# Patient Record
Sex: Female | Born: 1935 | Race: White | Hispanic: No | Smoking: Never smoker
Health system: Southern US, Community
[De-identification: ages and names within clinical notes are randomized; demographics above are authoritative.]

## PROBLEM LIST (undated history)

## (undated) DIAGNOSIS — C4490 Unspecified malignant neoplasm of skin, unspecified: Secondary | ICD-10-CM

## (undated) DIAGNOSIS — I10 Essential (primary) hypertension: Secondary | ICD-10-CM

## (undated) DIAGNOSIS — J189 Pneumonia, unspecified organism: Secondary | ICD-10-CM

## (undated) DIAGNOSIS — E7800 Pure hypercholesterolemia, unspecified: Secondary | ICD-10-CM

## (undated) DIAGNOSIS — T39395A Adverse effect of other nonsteroidal anti-inflammatory drugs [NSAID], initial encounter: Secondary | ICD-10-CM

## (undated) DIAGNOSIS — E079 Disorder of thyroid, unspecified: Secondary | ICD-10-CM

## (undated) DIAGNOSIS — I82409 Acute embolism and thrombosis of unspecified deep veins of unspecified lower extremity: Secondary | ICD-10-CM

## (undated) DIAGNOSIS — E0590 Thyrotoxicosis, unspecified without thyrotoxic crisis or storm: Secondary | ICD-10-CM

## (undated) DIAGNOSIS — K922 Gastrointestinal hemorrhage, unspecified: Secondary | ICD-10-CM

## (undated) HISTORY — PX: KNEE SURGERY: SHX244

## (undated) HISTORY — PX: TONSILLECTOMY: SUR1361

## (undated) HISTORY — PX: TUBAL LIGATION: SHX77

---

## 2018-07-19 ENCOUNTER — Emergency Department (HOSPITAL_BASED_OUTPATIENT_CLINIC_OR_DEPARTMENT_OTHER)
Admission: EM | Admit: 2018-07-19 | Discharge: 2018-07-19 | Disposition: A | Discharge status: Home / Self Care | Payer: Medicare Other | Attending: Emergency Medicine | Admitting: Emergency Medicine

## 2018-07-19 ENCOUNTER — Other Ambulatory Visit: Payer: Self-pay

## 2018-07-19 ENCOUNTER — Emergency Department (HOSPITAL_BASED_OUTPATIENT_CLINIC_OR_DEPARTMENT_OTHER): Payer: Medicare Other

## 2018-07-19 ENCOUNTER — Encounter (HOSPITAL_BASED_OUTPATIENT_CLINIC_OR_DEPARTMENT_OTHER): Payer: Self-pay

## 2018-07-19 DIAGNOSIS — S8011XA Contusion of right lower leg, initial encounter: Secondary | ICD-10-CM

## 2018-07-19 DIAGNOSIS — Y92003 Bedroom of unspecified non-institutional (private) residence as the place of occurrence of the external cause: Secondary | ICD-10-CM | POA: Insufficient documentation

## 2018-07-19 DIAGNOSIS — S8991XA Unspecified injury of right lower leg, initial encounter: Secondary | ICD-10-CM | POA: Diagnosis present

## 2018-07-19 DIAGNOSIS — I10 Essential (primary) hypertension: Secondary | ICD-10-CM | POA: Insufficient documentation

## 2018-07-19 DIAGNOSIS — W2209XA Striking against other stationary object, initial encounter: Secondary | ICD-10-CM | POA: Insufficient documentation

## 2018-07-19 DIAGNOSIS — Y998 Other external cause status: Secondary | ICD-10-CM | POA: Diagnosis not present

## 2018-07-19 DIAGNOSIS — Y9389 Activity, other specified: Secondary | ICD-10-CM | POA: Diagnosis not present

## 2018-07-19 HISTORY — DX: Disorder of thyroid, unspecified: E07.9

## 2018-07-19 HISTORY — DX: Adverse effect of other nonsteroidal anti-inflammatory drugs (NSAID), initial encounter: T39.395A

## 2018-07-19 HISTORY — DX: Essential (primary) hypertension: I10

## 2018-07-19 HISTORY — DX: Unspecified malignant neoplasm of skin, unspecified: C44.90

## 2018-07-19 HISTORY — DX: Gastrointestinal hemorrhage, unspecified: K92.2

## 2018-07-19 HISTORY — DX: Pure hypercholesterolemia, unspecified: E78.00

## 2018-07-19 HISTORY — DX: Acute embolism and thrombosis of unspecified deep veins of unspecified lower extremity: I82.409

## 2018-07-19 MED ORDER — ACETAMINOPHEN 325 MG PO TABS
650.0000 mg | ORAL_TABLET | Freq: Once | ORAL | Status: AC
  Administered 2018-07-19: 650 mg via ORAL

## 2018-07-19 MED ORDER — ACETAMINOPHEN 325 MG PO TABS
ORAL_TABLET | ORAL | Status: AC
  Filled 2018-07-19: qty 2

## 2018-07-19 NOTE — ED Triage Notes (Signed)
Pt states she injured right LE on side of her bed last night-bruising/swelling noted to tib/fib area-NAD-to triage in w/c

## 2018-07-19 NOTE — ED Provider Notes (Signed)
[facility] DEPART[facility] Provider Note   CSN: [id number] Arrival date & time: 07/19/18  1102     History   Chief Complaint Chief Complaint  Patient presents with  . Leg Injury    HPI Norma Coleman is a 84 y.o. female.  HPI  84 year old female presents with right leg pain and bruising.  Last night around 3 AM she was trying to get into her bed and when she swung her leg up it slipped and hit the bed.  She did not think was that bad but woke up with significant swelling and pain this morning.  No other injuries.  Has not taken anything for the pain.  Past Medical History:  Diagnosis Date  . DVT (deep venous thrombosis) ([place])   . GI bleed due to NSAIDs   . High cholesterol   . Hypertension   . Skin cancer   . Thyroid disease     There are no active problems to display for this patient.   Past Surgical History:  Procedure Laterality Date  . KNEE SURGERY    . TONSILLECTOMY    . TUBAL LIGATION       OB History   None      Home Medications    Prior to Admission medications   Not on File    Family History No family history on file.  Social History Social History   Tobacco Use  . Smoking status: Never Smoker  . Smokeless tobacco: Never Used  Substance Use Topics  . Alcohol use: Never    Frequency: Never  . Drug use: Never     Allergies   Ibuprofen and Sulfa antibiotics   Review of Systems Review of Systems  Cardiovascular: Positive for leg swelling.  Musculoskeletal: Positive for arthralgias.  Skin: Negative for wound.  Neurological: Negative for weakness and numbness.     Physical Exam Updated Vital Signs BP 138/64 (BP Location: Right Arm)   Pulse 88   Temp 98.1 F (36.7 C) (Oral)   Resp 16   Ht 5\' 3"  (1.6 m)   Wt 76.2 kg   SpO2 98%   BMI 29.76 kg/m   Physical Exam  Constitutional: She appears well-developed and well-nourished. No distress.  HENT:  Head: Normocephalic and atraumatic.  Right Ear: External  ear normal.  Left Ear: External ear normal.  Nose: Nose normal.  Eyes: Right eye exhibits no discharge. Left eye exhibits no discharge.  Cardiovascular: Normal rate and regular rhythm.  Pulses:      Dorsalis pedis pulses are 2+ on the right side.  Pulmonary/Chest: Effort normal.  Musculoskeletal:       Right knee: She exhibits normal range of motion. No tenderness found.       Right ankle: She exhibits normal range of motion. No tenderness.       Right lower leg: She exhibits tenderness and swelling.       Legs: Neurological: She is alert.  Skin: Skin is warm and dry. She is not diaphoretic.  Psychiatric: Her mood appears not anxious.  Nursing note and vitals reviewed.    ED Treatments / Results  Labs (all labs ordered are listed, but only abnormal results are displayed) Labs Reviewed - No data to display  EKG None  Radiology Dg Tibia/fibula Right  Result Date: 07/19/2018 CLINICAL DATA:  Right tib/fib injury this am, slipped while getting into bed, large bruise and hematoma to medial mid-shaft of tib/fibAble to bear weight EXAM: RIGHT TIBIA AND FIBULA -  2 VIEW COMPARISON:  None. FINDINGS: No fracture or bone lesion. Knee prosthetic components appear well seated and aligned. Ankle joint is normally spaced and aligned. There is anterior mid leg soft tissue swelling and more diffuse subcutaneous soft tissue edema. IMPRESSION: 1. No fracture or dislocation. No evidence of loosening of the knee prosthetic components. Electronically Signed   By: Lajean Manes M.D.   On: 07/19/2018 12:07    Procedures Procedures (including critical care time)  Medications Ordered in ED Medications - No data to display   Initial Impression / Assessment and Plan / ED Course  I have reviewed the triage vital signs and the nursing notes.  Pertinent labs & imaging results that were available during my care of the patient were reviewed by me and considered in my medical decision making (see chart for  details).     Patient does seem to have an exaggerated ecchymosis which is probably from the Eliquis she is on.  However there is no obvious fracture.  She is been able to walk on it without difficulty.  We discussed ice therapy and elevation as well as Tylenol.  Otherwise appears stable for discharge home.  Not consistent with compartment syndrome and is neurovascular intact.  Final Clinical Impressions(s) / ED Diagnoses   Final diagnoses:  Contusion of right leg, initial encounter    ED Discharge Orders    None       Sherwood Gambler, MD 07/19/18 1306

## 2018-07-19 NOTE — Discharge Instructions (Signed)
If you develop severe uncontrolled leg pain, worsening leg swelling, numbness or weakness, color change, or any other new/concerning symptoms and return to the ER for evaluation.  Otherwise follow-up with your primary care physician.

## 2018-07-19 NOTE — ED Notes (Signed)
Patient transported to X-ray 

## 2020-07-06 IMAGING — DX DG TIBIA/FIBULA 2V*R*
4 series · 4 of 4 positions shown · non-contrast
Comparison: None.

CLINICAL DATA: Right tib/fib injury this am, slipped while getting
into bed, large bruise and hematoma to medial mid-shaft of
tib/fibAble to bear weight

EXAM:
RIGHT TIBIA AND FIBULA - 2 VIEW

[tibia ap (1 of 2)]
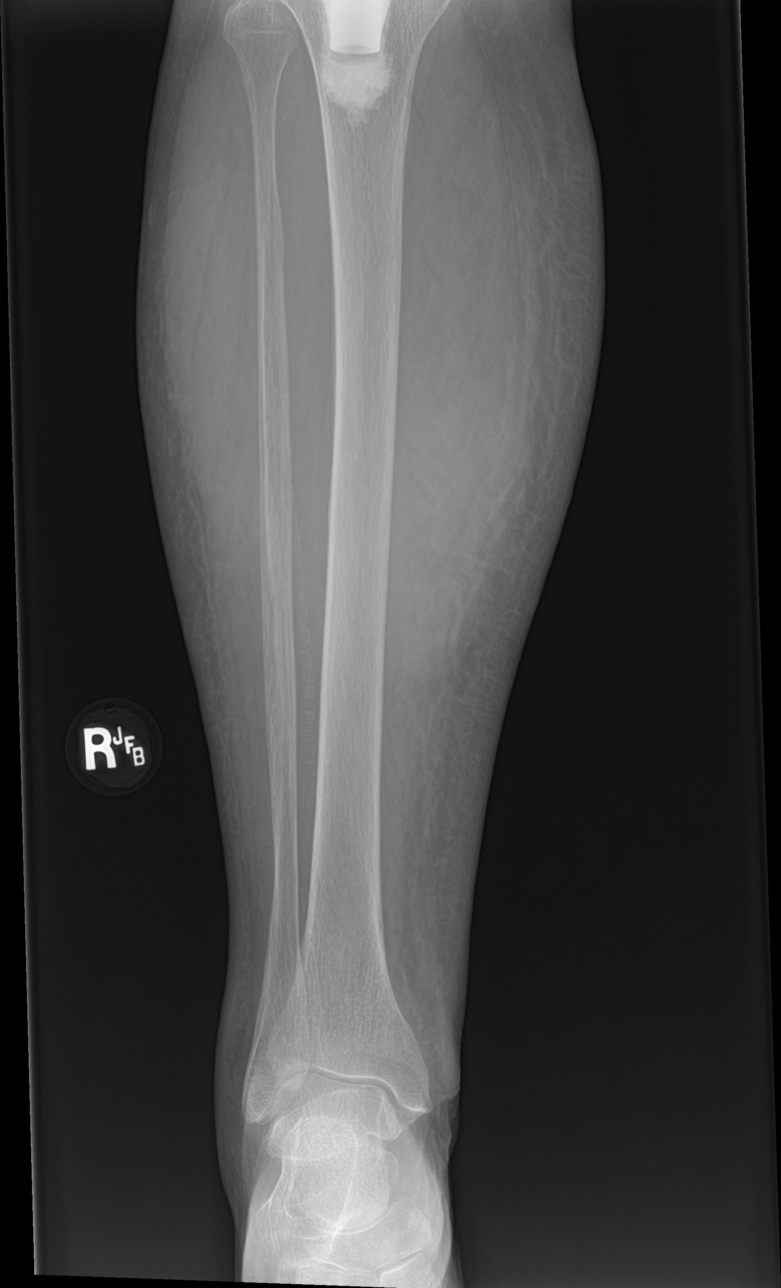

[tibia ap (2 of 2)]
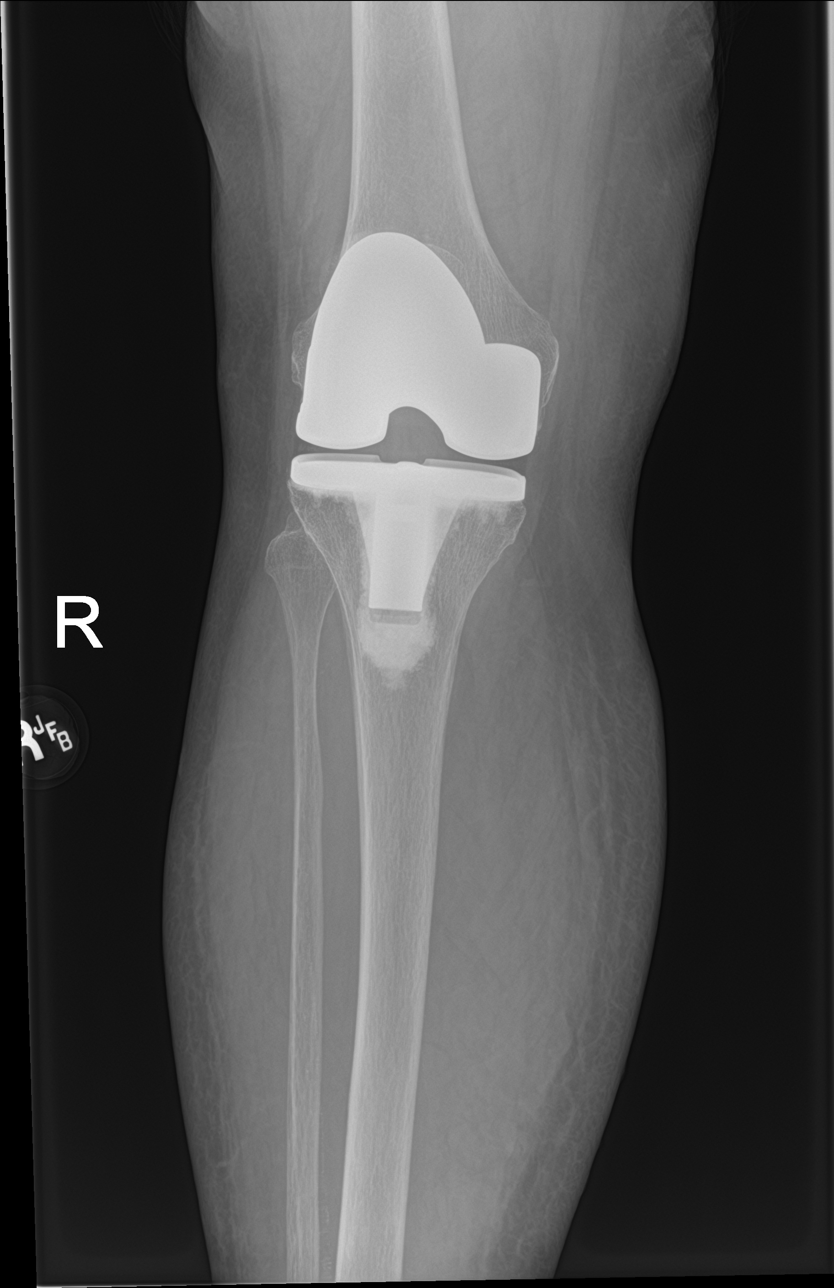

[tibia lat (1 of 2)]
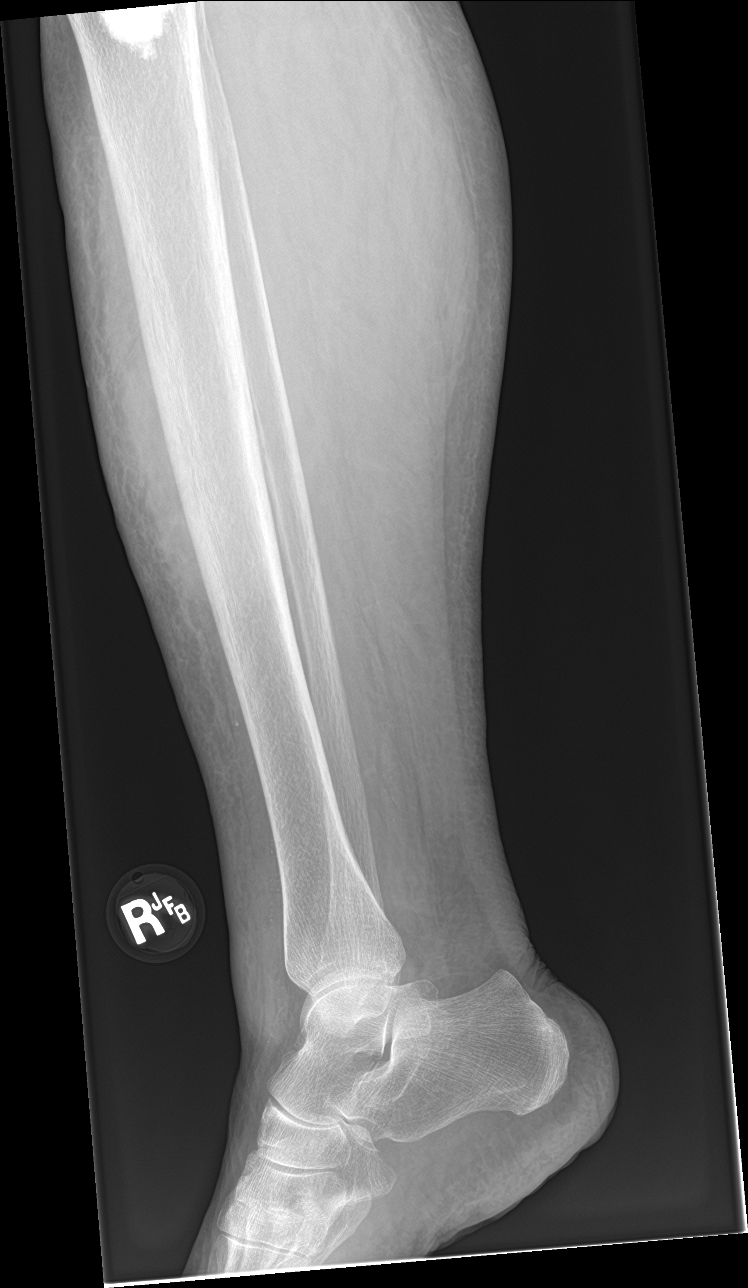

[tibia lat (2 of 2)]
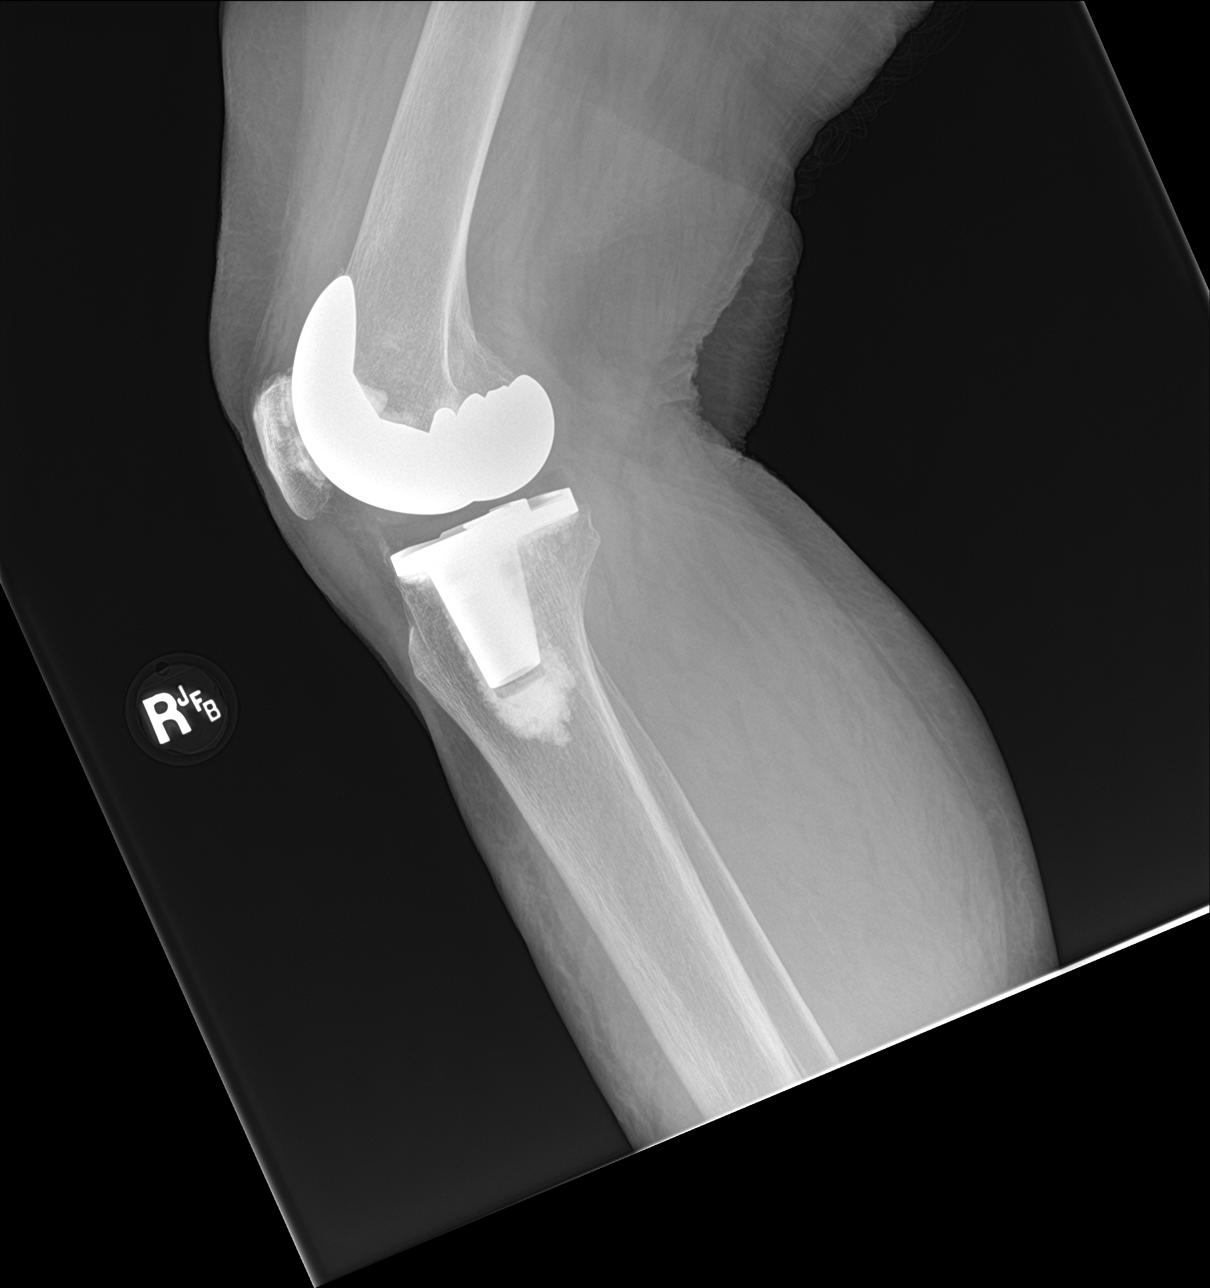

[4 of 4 positions shown; findings below may reference images not displayed]

FINDINGS: No fracture or bone lesion.

Knee prosthetic components appear well seated and aligned. Ankle
joint is normally spaced and aligned.

There is anterior mid leg soft tissue swelling and more diffuse
subcutaneous soft tissue edema.
IMPRESSION: 1. No fracture or dislocation. No evidence of loosening of the knee
prosthetic components.

## 2022-08-05 ENCOUNTER — Inpatient Hospital Stay (HOSPITAL_COMMUNITY): Payer: Medicare PPO

## 2022-08-05 ENCOUNTER — Encounter (HOSPITAL_COMMUNITY): Payer: Self-pay

## 2022-08-05 ENCOUNTER — Observation Stay (HOSPITAL_COMMUNITY)
Admission: EM | Admit: 2022-08-05 | Discharge: 2022-08-09 | Disposition: A | Discharge status: Home / Self Care | Payer: Medicare PPO | Attending: Internal Medicine | Admitting: Internal Medicine

## 2022-08-05 ENCOUNTER — Other Ambulatory Visit: Payer: Self-pay

## 2022-08-05 ENCOUNTER — Emergency Department (HOSPITAL_COMMUNITY): Payer: Medicare PPO

## 2022-08-05 DIAGNOSIS — R2689 Other abnormalities of gait and mobility: Secondary | ICD-10-CM | POA: Insufficient documentation

## 2022-08-05 DIAGNOSIS — K5900 Constipation, unspecified: Secondary | ICD-10-CM

## 2022-08-05 DIAGNOSIS — Z86718 Personal history of other venous thrombosis and embolism: Secondary | ICD-10-CM

## 2022-08-05 DIAGNOSIS — N185 Chronic kidney disease, stage 5: Secondary | ICD-10-CM | POA: Diagnosis present

## 2022-08-05 DIAGNOSIS — I132 Hypertensive heart and chronic kidney disease with heart failure and with stage 5 chronic kidney disease, or end stage renal disease: Secondary | ICD-10-CM | POA: Diagnosis not present

## 2022-08-05 DIAGNOSIS — N179 Acute kidney failure, unspecified: Secondary | ICD-10-CM | POA: Diagnosis present

## 2022-08-05 DIAGNOSIS — E119 Type 2 diabetes mellitus without complications: Secondary | ICD-10-CM

## 2022-08-05 DIAGNOSIS — R2681 Unsteadiness on feet: Secondary | ICD-10-CM | POA: Insufficient documentation

## 2022-08-05 DIAGNOSIS — T68XXXA Hypothermia, initial encounter: Secondary | ICD-10-CM | POA: Diagnosis present

## 2022-08-05 DIAGNOSIS — R197 Diarrhea, unspecified: Secondary | ICD-10-CM | POA: Diagnosis present

## 2022-08-05 DIAGNOSIS — I10 Essential (primary) hypertension: Secondary | ICD-10-CM | POA: Diagnosis present

## 2022-08-05 DIAGNOSIS — K922 Gastrointestinal hemorrhage, unspecified: Principal | ICD-10-CM | POA: Diagnosis present

## 2022-08-05 DIAGNOSIS — Z1152 Encounter for screening for COVID-19: Secondary | ICD-10-CM | POA: Insufficient documentation

## 2022-08-05 DIAGNOSIS — I517 Cardiomegaly: Secondary | ICD-10-CM | POA: Diagnosis present

## 2022-08-05 DIAGNOSIS — M889 Osteitis deformans of unspecified bone: Secondary | ICD-10-CM | POA: Diagnosis present

## 2022-08-05 DIAGNOSIS — I509 Heart failure, unspecified: Secondary | ICD-10-CM | POA: Diagnosis not present

## 2022-08-05 DIAGNOSIS — E782 Mixed hyperlipidemia: Secondary | ICD-10-CM

## 2022-08-05 DIAGNOSIS — E039 Hypothyroidism, unspecified: Secondary | ICD-10-CM | POA: Diagnosis present

## 2022-08-05 DIAGNOSIS — N184 Chronic kidney disease, stage 4 (severe): Secondary | ICD-10-CM

## 2022-08-05 DIAGNOSIS — I5032 Chronic diastolic (congestive) heart failure: Secondary | ICD-10-CM | POA: Diagnosis present

## 2022-08-05 DIAGNOSIS — E785 Hyperlipidemia, unspecified: Secondary | ICD-10-CM | POA: Diagnosis present

## 2022-08-05 DIAGNOSIS — E1122 Type 2 diabetes mellitus with diabetic chronic kidney disease: Secondary | ICD-10-CM | POA: Insufficient documentation

## 2022-08-05 LAB — CBC
HCT: 31.5 % — ABNORMAL LOW (ref 36.0–46.0)
Hemoglobin: 9.8 g/dL — ABNORMAL LOW (ref 12.0–15.0)
MCH: 31.7 pg (ref 26.0–34.0)
MCHC: 31.1 g/dL (ref 30.0–36.0)
MCV: 101.9 fL — ABNORMAL HIGH (ref 80.0–100.0)
Platelets: 230 10*3/uL (ref 150–400)
RBC: 3.09 MIL/uL — ABNORMAL LOW (ref 3.87–5.11)
RDW: 14.3 % (ref 11.5–15.5)
WBC: 4.5 10*3/uL (ref 4.0–10.5)
nRBC: 0 % (ref 0.0–0.2)

## 2022-08-05 LAB — URINALYSIS, COMPLETE (UACMP) WITH MICROSCOPIC
Bacteria, UA: NONE SEEN
Bilirubin Urine: NEGATIVE
Glucose, UA: NEGATIVE mg/dL
Hgb urine dipstick: NEGATIVE
Ketones, ur: NEGATIVE mg/dL
Leukocytes,Ua: NEGATIVE
Nitrite: NEGATIVE
Protein, ur: 100 mg/dL — AB
Specific Gravity, Urine: 1.006 (ref 1.005–1.030)
pH: 6 (ref 5.0–8.0)

## 2022-08-05 LAB — RETICULOCYTES
Immature Retic Fract: 5 % (ref 2.3–15.9)
RBC.: 3.04 MIL/uL — ABNORMAL LOW (ref 3.87–5.11)
Retic Count, Absolute: 18.2 10*3/uL — ABNORMAL LOW (ref 19.0–186.0)
Retic Ct Pct: 0.6 % (ref 0.4–3.1)

## 2022-08-05 LAB — COMPREHENSIVE METABOLIC PANEL
ALT: 18 U/L (ref 0–44)
AST: 24 U/L (ref 15–41)
Albumin: 3.6 g/dL (ref 3.5–5.0)
Alkaline Phosphatase: 170 U/L — ABNORMAL HIGH (ref 38–126)
Anion gap: 10 (ref 5–15)
BUN: 50 mg/dL — ABNORMAL HIGH (ref 8–23)
CO2: 19 mmol/L — ABNORMAL LOW (ref 22–32)
Calcium: 8.9 mg/dL (ref 8.9–10.3)
Chloride: 109 mmol/L (ref 98–111)
Creatinine, Ser: 4.47 mg/dL — ABNORMAL HIGH (ref 0.44–1.00)
GFR, Estimated: 9 mL/min — ABNORMAL LOW (ref >60)
Glucose, Bld: 105 mg/dL — ABNORMAL HIGH (ref 70–99)
Potassium: 5.1 mmol/L (ref 3.5–5.1)
Sodium: 138 mmol/L (ref 135–145)
Total Bilirubin: 0.7 mg/dL (ref 0.3–1.2)
Total Protein: 7 g/dL (ref 6.5–8.1)

## 2022-08-05 LAB — PROTIME-INR
INR: 1.1 (ref 0.8–1.2)
Prothrombin Time: 14.5 seconds (ref 11.4–15.2)

## 2022-08-05 LAB — CBC WITH DIFFERENTIAL/PLATELET
Abs Immature Granulocytes: 0.02 10*3/uL (ref 0.00–0.07)
Basophils Absolute: 0.1 10*3/uL (ref 0.0–0.1)
Basophils Relative: 1 %
Eosinophils Absolute: 0.1 10*3/uL (ref 0.0–0.5)
Eosinophils Relative: 2 %
HCT: 34.4 % — ABNORMAL LOW (ref 36.0–46.0)
Hemoglobin: 10.5 g/dL — ABNORMAL LOW (ref 12.0–15.0)
Immature Granulocytes: 0 %
Lymphocytes Relative: 16 %
Lymphs Abs: 0.9 10*3/uL (ref 0.7–4.0)
MCH: 31.5 pg (ref 26.0–34.0)
MCHC: 30.5 g/dL (ref 30.0–36.0)
MCV: 103.3 fL — ABNORMAL HIGH (ref 80.0–100.0)
Monocytes Absolute: 0.4 10*3/uL (ref 0.1–1.0)
Monocytes Relative: 6 %
Neutro Abs: 4.1 10*3/uL (ref 1.7–7.7)
Neutrophils Relative %: 75 %
Platelets: 251 10*3/uL (ref 150–400)
RBC: 3.33 MIL/uL — ABNORMAL LOW (ref 3.87–5.11)
RDW: 14.3 % (ref 11.5–15.5)
WBC: 5.5 10*3/uL (ref 4.0–10.5)
nRBC: 0 % (ref 0.0–0.2)

## 2022-08-05 LAB — IRON AND TIBC
Iron: 91 ug/dL (ref 28–170)
Saturation Ratios: 34 % — ABNORMAL HIGH (ref 10.4–31.8)
TIBC: 272 ug/dL (ref 250–450)
UIBC: 181 ug/dL

## 2022-08-05 LAB — FOLATE: Folate: 41.7 ng/mL (ref >5.9)

## 2022-08-05 LAB — CK: Total CK: 33 U/L — ABNORMAL LOW (ref 38–234)

## 2022-08-05 LAB — SODIUM, URINE, RANDOM: Sodium, Ur: 52 mmol/L

## 2022-08-05 LAB — ABO/RH: ABO/RH(D): O POS

## 2022-08-05 LAB — OCCULT BLOOD X 1 CARD TO LAB, STOOL: Fecal Occult Bld: POSITIVE — AB

## 2022-08-05 LAB — LACTIC ACID, PLASMA: Lactic Acid, Venous: 0.5 mmol/L (ref 0.5–1.9)

## 2022-08-05 LAB — PHOSPHORUS: Phosphorus: 4.6 mg/dL (ref 2.5–4.6)

## 2022-08-05 LAB — MAGNESIUM: Magnesium: 2.6 mg/dL — ABNORMAL HIGH (ref 1.7–2.4)

## 2022-08-05 LAB — CREATININE, URINE, RANDOM: Creatinine, Urine: 41 mg/dL

## 2022-08-05 LAB — TSH: TSH: 8.181 u[IU]/mL — ABNORMAL HIGH (ref 0.350–4.500)

## 2022-08-05 LAB — FERRITIN: Ferritin: 82 ng/mL (ref 11–307)

## 2022-08-05 LAB — VITAMIN B12: Vitamin B-12: 915 pg/mL — ABNORMAL HIGH (ref 180–914)

## 2022-08-05 LAB — SARS CORONAVIRUS 2 BY RT PCR: SARS Coronavirus 2 by RT PCR: NEGATIVE

## 2022-08-05 LAB — CBG MONITORING, ED: Glucose-Capillary: 81 mg/dL (ref 70–99)

## 2022-08-05 MED ORDER — LEVOTHYROXINE SODIUM 100 MCG PO TABS
100.0000 ug | ORAL_TABLET | Freq: Every day | ORAL | Status: DC
  Administered 2022-08-06: 100 ug via ORAL
  Filled 2022-08-05: qty 1

## 2022-08-05 MED ORDER — SODIUM CHLORIDE 0.9 % IV BOLUS
500.0000 mL | Freq: Once | INTRAVENOUS | Status: AC
  Administered 2022-08-05: 500 mL via INTRAVENOUS

## 2022-08-05 MED ORDER — INSULIN ASPART 100 UNIT/ML IJ SOLN
0.0000 [IU] | INTRAMUSCULAR | Status: DC
  Filled 2022-08-05: qty 0.09

## 2022-08-05 MED ORDER — PANTOPRAZOLE 80MG IVPB - SIMPLE MED
80.0000 mg | Freq: Once | INTRAVENOUS | Status: AC
  Administered 2022-08-05: 80 mg via INTRAVENOUS
  Filled 2022-08-05: qty 80

## 2022-08-05 NOTE — H&P (Addendum)
Norma Coleman BWG:[id number] DOB: 1932-11-20 DOA: 08/05/2022    PCP:   [facility] ([facility]    Outpatient Specialists:      Oncology  Dr. Shannan Harper, MD      Patient arrived to ER on 08/05/22 at 1219 Referred by Attending Toy Baker, MD   Patient coming from:    Home   Chief Complaint:   Chief Complaint  Patient presents with   Blood In Stools    HPI: Norma Coleman is a 88 y.o. female with medical history significant of hypertension, CKD stage IV, bilateral leg edema, GERD, chronic anemia, history of DVT on the left, diabetes GERD, IBS, history of mitral regurgitation, chronic diastolic dysfunction  Presented with   fatigue bright red blood per rectum diarrhea Patient not local originally from [place].  Has known history of CKD stage IV she was visiting her daughter for Thanksgiving and noticed some dark red and bright red blood per rectum as well as associated some diarrhea this morning.  Patient was brought into emergency department with nausea and vomiting associated.  Patient has not started dialysis but does has a left arm fistula Patient had viral-like illness prior to this Baseline hemoglobin 11 on 27 July 2022 Patient has been followed by hematology oncology receiving EPO  Have been having diarrhea for the past 3 days to day had some blood in it  Very minor abd pain    Initial COVID TEST  NEGATIVE    Lab Results  Component Value Date   [place] NEGATIVE 08/05/2022     Regarding pertinent Chronic problems:     Hyperlipidemia -  on statins Lipitor (atorvastatin)  Lipid Panel  No results found for: "CHOL", "TRIG", "HDL", "CHOLHDL", "VLDL", "LDLCALC", "LDLDIRECT", "LABVLDL"   HTN on Norvasc, Coreg, hydralazine   chronic CHF diastolic - last echo July 2023  There is moderate concentric left ventricular hypertrophy with septal predominance.  Overall left ventricular systolic function is normal with  ejection fraction approximately 60-65%, estimated visually.  Grade II diastolic dysfunction (moderate increase in filling pressures).     DM 2 -  controlled   Hypothyroidism:  on synthroid   Hx of DVT/PE on -  no longer on anticoagulation      CKD stage IV  baseline Cr 3.5 CrCl cannot be calculated (Unknown ideal weight.).  Lab Results  Component Value Date   CREATININE 4.47 (H) 08/05/2022      Chronic anemia - baseline hg Hemoglobin & Hematocrit  Recent Labs    08/05/22 1411  HGB 10.5*     While in ER:   Hemoccult positive stool hemoglobin 10.5 [facility] was consulted discussed also case with nephrology who recommends reconsulting them in the morning if renal function does not improve   CXR - ordered CT abd - ordered   Following Medications were ordered in ER: Medications  pantoprazole (PROTONIX) 80 mg /NS 100 mL IVPB (80 mg Intravenous New Bag/Given 08/05/22 1913)  sodium chloride 0.9 % bolus 500 mL (500 mLs Intravenous New Bag/Given 08/05/22 1819)    _______________________________________________________ ER Provider Called:  [facility]   Dr Simona Huh They Recommend admit to medicine   Will see in AM     ED Triage Vitals  Enc Vitals Group     BP 08/05/22 1230 (!) 123/50     Pulse Rate 08/05/22 1230 (!) 48     Resp 08/05/22 1230 16     Temp 08/05/22 1230 (!) 97.5 F (  36.4 C)     Temp Source 08/05/22 1847 Rectal     SpO2 08/05/22 1224 98 %     Weight --      Height --      Head Circumference --      Peak Flow --      Pain Score 08/05/22 1304 0     Pain Loc --      Pain Edu? --      Excl. in [place]? --   TMAX(24)@     _________________________________________ Significant initial  Findings: Abnormal Labs Reviewed  CBC WITH DIFFERENTIAL/PLATELET - Abnormal; Notable for the following components:      Result Value   RBC 3.33 (*)    Hemoglobin 10.5 (*)    HCT 34.4 (*)    MCV 103.3 (*)    All other components within normal limits  COMPREHENSIVE METABOLIC PANEL -  Abnormal; Notable for the following components:   CO2 19 (*)    Glucose, Bld 105 (*)    BUN 50 (*)    Creatinine, Ser 4.47 (*)    Alkaline Phosphatase 170 (*)    GFR, Estimated 9 (*)    All other components within normal limits     ECG: Ordered Personally reviewed and interpreted by me showing: HR : 70 Rhythm:Accelerated junctional rhythm Borderline low voltage, extremity leads No previous ECGs availabl QTC 461    The recent clinical data is shown below. Vitals:   08/05/22 1230 08/05/22 1230 08/05/22 1730 08/05/22 1847  BP: (!) 123/50 (!) 123/50 (!) 144/61 (!) 159/139  Pulse: (!) 48 (!) 48 60 67  Resp: '16 16 16 '$ (!) 21  Temp: (!) 97.5 F (36.4 C)   (!) 94.3 F (34.6 C)  TempSrc:    Rectal  SpO2: 98% 98% 96% 95%     WBC     Component Value Date/Time   WBC 5.5 08/05/2022 1411   LYMPHSABS 0.9 08/05/2022 1411   MONOABS 0.4 08/05/2022 1411   EOSABS 0.1 08/05/2022 1411   BASOSABS 0.1 08/05/2022 1411      UA   ordered     OTHER Significant initial  Findings:  labs showing:    Recent Labs  Lab 08/05/22 1411  NA 138  K 5.1  CO2 19*  GLUCOSE 105*  BUN 50*  CREATININE 4.47*  CALCIUM 8.9    Cr   Up from baseline see below Lab Results  Component Value Date   CREATININE 4.47 (H) 08/05/2022    Recent Labs  Lab 08/05/22 1411  AST 24  ALT 18  ALKPHOS 170*  BILITOT 0.7  PROT 7.0  ALBUMIN 3.6   Lab Results  Component Value Date   CALCIUM 8.9 08/05/2022    Plt: Lab Results  Component Value Date   PLT 251 08/05/2022    Recent Labs  Lab 08/05/22 1411  WBC 5.5  NEUTROABS 4.1  HGB 10.5*  HCT 34.4*  MCV 103.3*  PLT 251    HG/HCT  Down   from baseline see below    Component Value Date/Time   HGB 10.5 (L) 08/05/2022 1411   HCT 34.4 (L) 08/05/2022 1411   MCV 103.3 (H) 08/05/2022 1411     Cultures: No results found for: "SDES", "SPECREQUEST", "CULT", "REPTSTATUS"   Radiological Exams on Admission: No results  found. _______________________________________________________________________________________________________ Latest  Blood pressure (!) 159/139, pulse 67, temperature (!) 94.3 F (34.6 C), temperature source Rectal, resp. rate (!) 21, SpO2 95 %.   Vitals  labs  and radiology finding personally reviewed  Review of Systems:    Pertinent positives include:   , abdominal pain,, blood in stool diarrhea, Constitutional:  No weight loss, night sweats, Fevers, chills, fatigue, weight loss  HEENT:  No headaches, Difficulty swallowing,Tooth/dental problems,Sore throat,  No sneezing, itching, ear ache, nasal congestion, post nasal drip,  Cardio-vascular:  No chest pain, Orthopnea, PND, anasarca, dizziness, palpitations.no Bilateral lower extremity swelling  GI:  No heartburn, indigestion nausea, vomiting,  change in bowel habits, loss of appetite, melena, hematemesis Resp:  no shortness of breath at rest. No dyspnea on exertion, No excess mucus, no productive cough, No non-productive cough, No coughing up of blood.No change in color of mucus.No wheezing. Skin:  no rash or lesions. No jaundice GU:  no dysuria, change in color of urine, no urgency or frequency. No straining to urinate.  No flank pain.  Musculoskeletal:  No joint pain or no joint swelling. No decreased range of motion. No back pain.  Psych:  No change in mood or affect. No depression or anxiety. No memory loss.  Neuro: no localizing neurological complaints, no tingling, no weakness, no double vision, no gait abnormality, no slurred speech, no confusion  All systems reviewed and apart from [place] all are negative _______________________________________________________________________________________________ Past Medical History:   Past Medical History:  Diagnosis Date   DVT (deep venous thrombosis) (HCC)    GI bleed due to NSAIDs    High cholesterol    Hypertension    Skin cancer    Thyroid disease       Past Surgical  History:  Procedure Laterality Date   KNEE SURGERY     TONSILLECTOMY     TUBAL LIGATION      Social History:  Ambulatory   cane,       reports that she has never smoked. She has never used smokeless tobacco. She reports that she does not drink alcohol and does not use drugs.     Family History:  Heart Disease Father   Heart Attack Father   Cancer-Pancreatic Father   Healthy Mother   Cancer-Breast Mother 15   Healthy Other   Heart Disease Paternal Grandfather  ______________________________________________________________________________________________ Allergies: Allergies  Allergen Reactions   Ibuprofen    Sulfa Antibiotics     Prior to Admission medications   Not on File  Medication Sig Dispense Refill   acetaminophen/diphenhydramine (TYLENOL PM ORAL) Take 1 Tablet by mouth Daily at bedtime.   ALPRAZolam (XANAX) 1 mg tablet Take 1 Tablet (1 mg) by mouth Daily as needed for Anxiety. 30 Tablet 2   amLODIPine (NORVASC) 10 mg tablet Take 1 Tablet (10 mg) by mouth Daily. 90 Tablet 1   ascorbic acid (VITAMIN C) 500 mg tablet Take 500 mg by mouth Daily.   atorvastatin (LIPITOR) 40 mg tablet Take 1 Tab (40 mg) by mouth Daily. 90 Tablet 1   carvediloL (COREG) 3.125 mg tablet TAKE ONE TABLET BY MOUTH TWICE DAILY WITH MEALS 180 Tablet 1   Cholecalciferol, Vitamin D3, 2,000 unit Capsule Take 1 Capsule by mouth Daily.   CYANOCOBALAMIN, VITAMIN B-12, (VITAMIN B-12 ORAL) Take 1 Tablet by mouth Daily.   docusate sodium (COLACE) 100 mg capsule Take 100 mg by mouth Daily at bedtime.   epoetin alfa-epbx (RETACRIT INJECTION) by Injection route Every 2 weeks (14 days).   furosemide (LASIX) 40 mg tablet Take 40 mg by mouth Daily as needed. PRN   hydrALAZINE (APRESOLINE) 50 mg tablet Take 1 Tablet (50 mg) by mouth Three  times daily. 270 Tablet 1   hyoscyamine (LEVSIN) 0.125 mg Tablet, Sublingual Place 1 Tablet (0.125 mg) under tongue Every 6 hours as needed for Spasm. 60 Tablet 1    LEVOTHYROXINE 75 mcg Oral tablet Take 1 Tablet (75 mcg) by mouth Daily early. 90 Tablet 1   loratadine (CLARITIN) 10 mg tablet Take 10 mg by mouth 1 time daily as needed for Allergies.   MULTIVITAMIN WITH MINERALS (ONE-A-DAY 50 PLUS ORAL) Take 1 Tablet by mouth Daily.   sodium bicarbonate 650 mg tablet Take 650 mg by mouth Two times daily.   ___________________________________________________________________________________________________ Physical Exam:    08/05/2022    6:47 PM 08/05/2022    5:30 PM 08/05/2022   12:30 PM  Vitals with BMI  Systolic 706 237 628   315  Diastolic 176 61 50   50  Pulse 67 60 48   48     1. General:  in No  Acute distress   Chronically ill   -appearing 2. Psychological: Alert and   Oriented 3. Head/ENT:    Dry Mucous Membranes                          Head Non traumatic, neck supple                         Poor Dentition 4. SKIN:  decreased Skin turgor,  Skin clean Dry and intact no rash 5. Heart: Regular rate and rhythm no  Murmur, no Rub or gallop 6. Lungs:   no wheezes or crackles   7. Abdomen: Soft, LLQ -tender, Non distended   obese  bowel sounds present 8. Lower extremities: no clubbing, cyanosis, trace edema 9. Neurologically Grossly intact, moving all 4 extremities equally   10. MSK: Normal range of motion    Chart has been reviewed  ______________________________________________________________________________________________  Assessment/Plan 88 y.o. female with medical history significant of hypertension, CKD stage IV, bilateral leg edema, GERD, chronic anemia, history of DVT on the left, diabetes GERD, IBS, history of mitral regurgitation   Admitted for lower GI bleed and anemia   Present on Admission:  Lower GI bleed  CKD (chronic kidney disease), stage IV ([place])  Essential hypertension  Hyperlipidemia  Hypothyroidism  Hypothermia  Diarrhea  Chronic diastolic CHF (congestive heart failure) (HCC)  Cardiomegaly      Lower GI bleed - Suspect Lower Gi source   - Admit  For further management given:  Age >60 years, comorbid illnesses  ,    -  most likely Diverticular source        -  ER  Provider spoke to gastroenterology ( [facility]) they will see patient in a.m. appreciate their consult   - serial CBC.    - Monitor for any recurrence,  evidence of hemodynamic instability or significant blood loss -  type and screen,  - Transfuse as needed for hemoglobin below 7 or <9 if evidence of significant  bleeding  - Establish at least 2 PIV and fluid resuscitate   - clear liquids for tonight keep nothing by mouth post midnight,  -  monitor for Recurrent significant  Bleeding of red blood and hemodynamic instability in which case Bleeding scan and IR consult would be indicated.   CKD (chronic kidney disease), stage IV (HCC)  -chronic avoid nephrotoxic medications such as NSAIDs, Vanco Zosyn combo,  avoid hypotension, continue to follow renal function, AKI in the settin gof CKD Admit  to [facility]  Nephrolog consult if not improved with IVfluids   Essential hypertension Allow permissive hypertension  Hyperlipidemia Continue Lipitor 40 mg po qday  History of DVT (deep vein thrombosis) No longer on anticoagulation  Hypothyroidism Check TSH  elevated >8 Given bradycardia hypothermia will increase Synthroid check T4 T3 levels  Hypothermia Will check TSH, currently on bear hugger Admit to progressive Monitor for any sign of infection  Obtain blood cultures CXR CT abd given Abd pain /tenderness. dairrhea  DM2 (diabetes mellitus, type 2) (HCC) Mild diet controlled  Will order SSI  Diarrhea Order gastro panel c.diff  Denies any recent ABX use Gi consult in AM   Chronic diastolic CHF (congestive heart failure) (HCC) Currently appears on the dry side will rehydrate and hold lasix  Cardiomegaly Cardiomegaly will obtain echogram    Other plan as per orders.  DVT prophylaxis:  SCD     Code Status:     Code Status: Not on file FULL CODE   as per patient   I had personally discussed CODE STATUS with patient and family  Family Communication:   Family   at  Bedside  plan of care was discussed  with  Daughter,   Disposition Plan:     To home once workup is complete and patient is stable   Following barriers for discharge:                                                        Anemia stable                                                       Will need consultants to evaluate patient prior to discharge                     Would benefit from PT/OT eval prior to DC  Ordered                    Consults called: [facility]  Admission status:  ED Disposition     ED Disposition  Admit   Condition  --   [place]: [place] [[id number]]  Level of Care: Telemetry Medical [[id number]]  May admit patient to [facility] or [facility] if equivalent level of care is available:: No  Covid Evaluation: Symptomatic Person Under Investigation (PUI) or recent exposure (last 10 days) *Testing Required*  Diagnosis: Lower GI bleed [[id number]]  Admitting Physician: Toy Baker [[id number]  Attending Physician: Toy Baker [[id number]]  Certification:: I certify this patient will need inpatient services for at least 2 midnights  Estimated Length of Stay: 2          inpatient     I Expect 2 midnight stay secondary to severity of patient's current illness need for inpatient interventions justified by the following:     Severe lab/radiological/exam abnormalities including:   Lower GI bleed and anemia and extensive comorbidities including:   CKD    That are currently affecting medical management.   I expect  patient to be hospitalized for 2 midnights requiring inpatient medical care.  Patient is at high  risk for adverse outcome (such as loss of life or disability) if not treated.  Indication for inpatient stay as follows:    inability to maintain oral hydration    Need for  operative/procedural  intervention    Need for  IV fluids, IV PPI    Level of care     tele  For 12H      Lab Results  Component Value Date   [place] NEGATIVE 08/05/2022     Precautions: admitted as   Covid Negative    Alante Tolan 08/05/2022, 9:13 PM    [facility]     after 2 AM please page floor coverage PA If 7AM-7PM, please contact the day team taking care of the patient using [email or web address]   Patient was evaluated in the context of the global COVID-19 pandemic, which necessitated consideration that the patient might be at risk for infection with the SARS-CoV-2 virus that causes COVID-19. Institutional protocols and algorithms that pertain to the evaluation of patients at risk for COVID-19 are in a state of rapid change based on information released by regulatory bodies including the CDC and federal and state organizations. These policies and algorithms were followed during the patient's care.

## 2022-08-05 NOTE — Assessment & Plan Note (Signed)
No longer on anticoagulation 

## 2022-08-05 NOTE — Assessment & Plan Note (Signed)
Continue Lipitor 40 mg po qday

## 2022-08-05 NOTE — Assessment & Plan Note (Signed)
Cardiomegaly will obtain echogram

## 2022-08-05 NOTE — Assessment & Plan Note (Addendum)
Will check TSH, currently on bear hugger Admit to progressive Monitor for any sign of infection  Obtain blood cultures CXR CT abd given Abd pain /tenderness. dairrhea

## 2022-08-05 NOTE — Subjective & Objective (Signed)
Patient not local originally from [place].  Has known history of CKD stage IV she was visiting her daughter for Thanksgiving and noticed some dark red and bright red blood per rectum as well as associated some diarrhea this morning.  Patient was brought into emergency department with nausea and vomiting associated.  Patient has not started dialysis but does has a left arm fistula

## 2022-08-05 NOTE — Assessment & Plan Note (Signed)
Mild diet controlled  Will order SSI

## 2022-08-05 NOTE — ED Provider Triage Note (Signed)
Emergency Medicine Provider Triage Evaluation Note  Norma Coleman , a 88 y.o. female  was evaluated in triage.  Pt complains of blood in stool.  Pt has a history of renal failure  stage 5 .  Review of Systems  Positive: Fever and nausea Negative:   Physical Exam  BP (!) 123/50   Pulse (!) 48   Temp (!) 97.5 F (36.4 C)   Resp 16   SpO2 98%  Gen:   Awake, no distress   Resp:  Normal effort  MSK:   Moves extremities without difficulty  Other:    Medical Decision Making  Medically screening exam initiated at 1:38 PM.  Appropriate orders placed.  Norma Coleman was informed that the remainder of the evaluation will be completed by another provider, this initial triage assessment does not replace that evaluation, and the importance of remaining in the ED until their evaluation is complete.     Fransico Meadow, [place] 08/05/22 1339

## 2022-08-05 NOTE — Assessment & Plan Note (Addendum)
Check TSH  elevated >8 Given bradycardia hypothermia will increase Synthroid check T4 T3 levels

## 2022-08-05 NOTE — ED Provider Notes (Signed)
[facility] DEPT Provider Note   CSN: [id number] Arrival date & time: 08/05/22  1219     History  Chief Complaint  Patient presents with   Blood In [place] is a 88 y.o. female.  Patient presents for blood in stool. Traveling from [place] for the holidays. States Thursday night had some congestion,cough and diarrhea. Stayed in bed Friday did not eat or drink much. Has not had much of an appetite since Thursday. This morning felt better but went to the restroom and had red loose stool. Denies having blood in her stool before. Denies feeling dizzy or lightheaded. No syncopal episodes. Does make urine. No urinary symptoms. Denies abdominal pain or cramping. Does endorse SOB that has been progressing over the last 2 weeks.      Home Medications Prior to Admission medications   Not on File      Allergies    Ibuprofen and Sulfa antibiotics    Review of Systems   Review of Systems  Constitutional:  Positive for activity change and fatigue. Negative for fever.  HENT:  Positive for congestion.   Eyes:  Positive for redness.  Respiratory:  Positive for cough and shortness of breath.   Gastrointestinal:  Positive for blood in stool and diarrhea. Negative for abdominal pain and constipation.  Neurological:  Positive for weakness. Negative for dizziness and light-headedness.    Physical Exam Updated Vital Signs BP (!) 123/50   Pulse (!) 48   Temp (!) 97.5 F (36.4 C)   Resp 16   SpO2 98%  Physical Exam Constitutional:      Appearance: She is ill-appearing.  HENT:     Head: Normocephalic and atraumatic.     Nose: Nose normal.     Mouth/Throat:     Mouth: Mucous membranes are moist.     Pharynx: Oropharynx is clear.  Eyes:     Extraocular Movements: Extraocular movements intact.     Conjunctiva/sclera: Conjunctivae normal.     Pupils: Pupils are equal, round, and reactive to light.  Cardiovascular:     Rate and Rhythm:  Normal rate and regular rhythm.     Heart sounds: Murmur heard.  Pulmonary:     Effort: Pulmonary effort is normal.     Breath sounds: Normal breath sounds.  Abdominal:     General: There is no distension.     Palpations: Abdomen is soft.     Tenderness: There is no abdominal tenderness.  Musculoskeletal:        General: Normal range of motion.     Cervical back: Normal range of motion and neck supple.  Skin:    General: Skin is warm and dry.  Neurological:     Mental Status: She is alert.     ED Results / Procedures / Treatments   Labs (all labs ordered are listed, but only abnormal results are displayed) Labs Reviewed  CBC WITH DIFFERENTIAL/PLATELET - Abnormal; Notable for the following components:      Result Value   RBC 3.33 (*)    Hemoglobin 10.5 (*)    HCT 34.4 (*)    MCV 103.3 (*)    All other components within normal limits  COMPREHENSIVE METABOLIC PANEL - Abnormal; Notable for the following components:   CO2 19 (*)    Glucose, Bld 105 (*)    BUN 50 (*)    Creatinine, Ser 4.47 (*)    Alkaline Phosphatase 170 (*)    GFR, Estimated  9 (*)    All other components within normal limits  OCCULT BLOOD X 1 CARD TO LAB, STOOL  ABO/RH    EKG Accelerated junctional rhythm   Radiology No results found.  Procedures Procedures    Medications Ordered in ED Medications - No data to display  ED Course/ Medical Decision Making/ A&P                           Medical Decision Making Amount and/or Complexity of Data Reviewed Radiology: ordered.  Risk Decision regarding hospitalization.   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 Also with 2 days of cough, congestion and weakness. On exam rectal Temp 94.3, BP 159/139. Hgb 10.5. Cr 4.47, GFR 9 (Baseline Cr ~3.7). BUN 50. K+ 5.1. FOBT positive. CXR with cardiomegaly and without signs of infection. Gave Protonix '80mg'$  IV bolus and 500 cc NS bolus. Placed  patient on Bair hugger for low temp. Suspect GI bleed likely due to diverticulosis, potentially gastritis vs ulcer though less likely given stool not melanotic. With her end stage kidney disease discussed with nephrologist Dr. Osborne Casco who stated they would see her tomorrow if her kidney function does not return to baseline and to have hospitalist call them if needed. Discussed with hospitalist who agreed to admit patient for GI bleed work up and will transfer to [facility] in the event she does need dialysis.    Final Clinical Impression(s) / ED Diagnoses Final diagnoses:  None    Rx / DC Orders ED Discharge Orders     None         Shary Key, DO 08/05/22 2049    Norma Freeze, MD 08/05/22 [phone]

## 2022-08-05 NOTE — Assessment & Plan Note (Signed)
Allow permissive hypertension 

## 2022-08-05 NOTE — Assessment & Plan Note (Signed)
-   Suspect Lower Gi source   - Admit  For further management given:  Age >60 years, comorbid illnesses  ,    -  most likely Diverticular source        -  ER  Provider spoke to gastroenterology ( [facility]) they will see patient in a.m. appreciate their consult   - serial CBC.    - Monitor for any recurrence,  evidence of hemodynamic instability or significant blood loss -  type and screen,  - Transfuse as needed for hemoglobin below 7 or <9 if evidence of significant  bleeding  - Establish at least 2 PIV and fluid resuscitate   - clear liquids for tonight keep nothing by mouth post midnight,  -  monitor for Recurrent significant  Bleeding of red blood and hemodynamic instability in which case Bleeding scan and IR consult would be indicated.

## 2022-08-05 NOTE — Assessment & Plan Note (Signed)
Currently appears on the dry side will rehydrate and hold lasix

## 2022-08-05 NOTE — Assessment & Plan Note (Addendum)
-  chronic avoid nephrotoxic medications such as NSAIDs, Vanco Zosyn combo,  avoid hypotension, continue to follow renal function, AKI in the settin gof CKD Admit to [facility]  Nephrolog consult if not improved with IVfluids

## 2022-08-05 NOTE — ED Triage Notes (Signed)
Per EMS- patient ws visiting her daughter and had  dark red and bright red blood in her diarrheal stool. This AM. Patient has a left arm fistula, but has not started dialysis.  Patient denies any N/V

## 2022-08-05 NOTE — Assessment & Plan Note (Signed)
Order gastro panel c.diff  Denies any recent ABX use Gi consult in AM

## 2022-08-06 DIAGNOSIS — E039 Hypothyroidism, unspecified: Secondary | ICD-10-CM | POA: Diagnosis not present

## 2022-08-06 DIAGNOSIS — K922 Gastrointestinal hemorrhage, unspecified: Secondary | ICD-10-CM | POA: Diagnosis not present

## 2022-08-06 DIAGNOSIS — M889 Osteitis deformans of unspecified bone: Secondary | ICD-10-CM | POA: Diagnosis not present

## 2022-08-06 LAB — CBG MONITORING, ED
Glucose-Capillary: 104 mg/dL — ABNORMAL HIGH (ref 70–99)
Glucose-Capillary: 71 mg/dL (ref 70–99)
Glucose-Capillary: 72 mg/dL (ref 70–99)
Glucose-Capillary: 81 mg/dL (ref 70–99)
Glucose-Capillary: 84 mg/dL (ref 70–99)
Glucose-Capillary: 84 mg/dL (ref 70–99)
Glucose-Capillary: 87 mg/dL (ref 70–99)

## 2022-08-06 LAB — CBC
HCT: 31.2 % — ABNORMAL LOW (ref 36.0–46.0)
HCT: 32.2 % — ABNORMAL LOW (ref 36.0–46.0)
Hemoglobin: 9.9 g/dL — ABNORMAL LOW (ref 12.0–15.0)
Hemoglobin: 9.8 g/dL — ABNORMAL LOW (ref 12.0–15.0)
MCH: 32.7 pg (ref 26.0–34.0)
MCH: 31.6 pg (ref 26.0–34.0)
MCHC: 31.7 g/dL (ref 30.0–36.0)
MCHC: 30.4 g/dL (ref 30.0–36.0)
MCV: 103 fL — ABNORMAL HIGH (ref 80.0–100.0)
MCV: 103.9 fL — ABNORMAL HIGH (ref 80.0–100.0)
Platelets: 230 10*3/uL (ref 150–400)
Platelets: 237 10*3/uL (ref 150–400)
RBC: 3.03 MIL/uL — ABNORMAL LOW (ref 3.87–5.11)
RBC: 3.1 MIL/uL — ABNORMAL LOW (ref 3.87–5.11)
RDW: 14.6 % (ref 11.5–15.5)
RDW: 14.5 % (ref 11.5–15.5)
WBC: 6.5 10*3/uL (ref 4.0–10.5)
WBC: 5.5 10*3/uL (ref 4.0–10.5)
nRBC: 0 % (ref 0.0–0.2)
nRBC: 0 % (ref 0.0–0.2)

## 2022-08-06 LAB — RENAL FUNCTION PANEL
Albumin: 3.3 g/dL — ABNORMAL LOW (ref 3.5–5.0)
Anion gap: 13 (ref 5–15)
BUN: 50 mg/dL — ABNORMAL HIGH (ref 8–23)
CO2: 19 mmol/L — ABNORMAL LOW (ref 22–32)
Calcium: 9.2 mg/dL (ref 8.9–10.3)
Chloride: 111 mmol/L (ref 98–111)
Creatinine, Ser: 4.51 mg/dL — ABNORMAL HIGH (ref 0.44–1.00)
GFR, Estimated: 9 mL/min — ABNORMAL LOW (ref >60)
Glucose, Bld: 81 mg/dL (ref 70–99)
Phosphorus: 4.6 mg/dL (ref 2.5–4.6)
Potassium: 4.9 mmol/L (ref 3.5–5.1)
Sodium: 143 mmol/L (ref 135–145)

## 2022-08-06 LAB — T4, FREE: Free T4: 1.09 ng/dL (ref 0.61–1.12)

## 2022-08-06 LAB — HEMOGLOBIN AND HEMATOCRIT, BLOOD
HCT: 31.8 % — ABNORMAL LOW (ref 36.0–46.0)
Hemoglobin: 9.9 g/dL — ABNORMAL LOW (ref 12.0–15.0)

## 2022-08-06 LAB — MAGNESIUM: Magnesium: 2.4 mg/dL (ref 1.7–2.4)

## 2022-08-06 LAB — PREALBUMIN: Prealbumin: 25 mg/dL (ref 18–38)

## 2022-08-06 MED ORDER — DEXTROSE 50 % IV SOLN
25.0000 mL | Freq: Once | INTRAVENOUS | Status: AC
  Administered 2022-08-06: 25 mL via INTRAVENOUS
  Filled 2022-08-06: qty 50

## 2022-08-06 MED ORDER — SIMETHICONE 80 MG PO CHEW
80.0000 mg | CHEWABLE_TABLET | Freq: Four times a day (QID) | ORAL | Status: DC | PRN
  Administered 2022-08-06 – 2022-08-08 (×4): 80 mg via ORAL
  Filled 2022-08-06 (×5): qty 1

## 2022-08-06 MED ORDER — ACETAMINOPHEN 325 MG PO TABS
650.0000 mg | ORAL_TABLET | Freq: Four times a day (QID) | ORAL | Status: DC | PRN
  Administered 2022-08-08: 650 mg via ORAL
  Filled 2022-08-06: qty 2

## 2022-08-06 MED ORDER — LEVOTHYROXINE SODIUM 50 MCG PO TABS
75.0000 ug | ORAL_TABLET | Freq: Every day | ORAL | Status: DC
  Administered 2022-08-07 – 2022-08-09 (×3): 75 ug via ORAL
  Filled 2022-08-06 (×3): qty 1

## 2022-08-06 MED ORDER — VITAMIN B-12 100 MCG PO TABS
100.0000 ug | ORAL_TABLET | Freq: Every day | ORAL | Status: DC
  Administered 2022-08-06 – 2022-08-09 (×3): 100 ug via ORAL
  Filled 2022-08-06 (×5): qty 1

## 2022-08-06 MED ORDER — ALPRAZOLAM 0.5 MG PO TABS
1.0000 mg | ORAL_TABLET | Freq: Every evening | ORAL | Status: DC | PRN
  Administered 2022-08-07 – 2022-08-08 (×2): 1 mg via ORAL
  Filled 2022-08-06 (×2): qty 2

## 2022-08-06 MED ORDER — ATORVASTATIN CALCIUM 40 MG PO TABS
40.0000 mg | ORAL_TABLET | Freq: Every day | ORAL | Status: DC
  Administered 2022-08-06 – 2022-08-09 (×4): 40 mg via ORAL
  Filled 2022-08-06 (×4): qty 1

## 2022-08-06 MED ORDER — PANTOPRAZOLE SODIUM 40 MG PO TBEC
40.0000 mg | DELAYED_RELEASE_TABLET | Freq: Two times a day (BID) | ORAL | Status: DC
  Administered 2022-08-06 – 2022-08-09 (×7): 40 mg via ORAL
  Filled 2022-08-06 (×7): qty 1

## 2022-08-06 MED ORDER — AMLODIPINE BESYLATE 10 MG PO TABS
10.0000 mg | ORAL_TABLET | Freq: Every day | ORAL | Status: DC
  Administered 2022-08-06 – 2022-08-09 (×4): 10 mg via ORAL
  Filled 2022-08-06: qty 1
  Filled 2022-08-06: qty 2
  Filled 2022-08-06: qty 1
  Filled 2022-08-06: qty 2

## 2022-08-06 MED ORDER — SODIUM CHLORIDE 0.9 % IV SOLN: INTRAVENOUS | Status: AC

## 2022-08-06 MED ORDER — HYDROCODONE-ACETAMINOPHEN 5-325 MG PO TABS
1.0000 | ORAL_TABLET | ORAL | Status: DC | PRN
  Administered 2022-08-06 (×2): 1 via ORAL
  Administered 2022-08-07: 2 via ORAL
  Filled 2022-08-06 (×2): qty 1
  Filled 2022-08-06: qty 2

## 2022-08-06 MED ORDER — CARVEDILOL 3.125 MG PO TABS
3.1250 mg | ORAL_TABLET | Freq: Two times a day (BID) | ORAL | Status: DC
  Administered 2022-08-06 – 2022-08-09 (×5): 3.125 mg via ORAL
  Filled 2022-08-06 (×6): qty 1

## 2022-08-06 MED ORDER — INSULIN ASPART 100 UNIT/ML IJ SOLN
0.0000 [IU] | Freq: Three times a day (TID) | INTRAMUSCULAR | Status: DC
  Administered 2022-08-08 – 2022-08-09 (×3): 1 [IU] via SUBCUTANEOUS
  Filled 2022-08-06: qty 0.09

## 2022-08-06 MED ORDER — SODIUM BICARBONATE 650 MG PO TABS
650.0000 mg | ORAL_TABLET | Freq: Two times a day (BID) | ORAL | Status: DC
  Administered 2022-08-06 – 2022-08-09 (×7): 650 mg via ORAL
  Filled 2022-08-06 (×8): qty 1

## 2022-08-06 MED ORDER — INSULIN ASPART 100 UNIT/ML IJ SOLN
0.0000 [IU] | Freq: Every day | INTRAMUSCULAR | Status: DC
  Filled 2022-08-06: qty 0.05

## 2022-08-06 MED ORDER — ACETAMINOPHEN 650 MG RE SUPP: 650.0000 mg | Freq: Four times a day (QID) | RECTAL | Status: DC | PRN

## 2022-08-06 NOTE — Progress Notes (Signed)
Progress Note    Norma Coleman   NLG:[id number]  DOB: Jun 20, 1933  DOA: 08/05/2022     1 PCP: Patient, No Pcp Per  Initial CC: blood stools  Hospital Course: Norma Coleman is an 88 yo female with PMH CKD 5 (LUE fistula in place, set to mature ~08/2022), GERD, sciatica, anemia of chronic disease, HTN, DMII, OA, diverticulitis, HLD, mitral regurg, senile osteoporosis who presented to the ER with bloody stools. Patient is in town visiting from [place] for the holiday.  Due to ongoing bloody bowel movements, she presented for further evaluation. She could not remember when her last colonoscopy was and denies any significant recurrent history of bloody bowel movements but does endorse a remote history of one that she can remember.  Details unknown regarding etiology or treatment. CT performed on admission showed underlying scattered diverticulosis. Hemoglobin 10.5 g/dL on admission.  GI was also consulted on admission. She also follows outpatient with hematology for EPO injections in setting of her anemia of chronic disease.  Interval History:  Seen this morning in the ER.  Daughter present bedside.  No further stools since admission.  Plan will be for monitoring further overnight.  Patient has been seen by GI with no plans for inpatient intervention either.  Assessment and Plan: * Lower GI bleed - Patient presented with reports of bloody bowel movements at home prior to admission.  No further bowel movements since admission.  No associated abdominal pain nor any nausea/vomiting.  Patient also afebrile with no leukocytosis - Differential for etiology includes underlying diverticular bleed versus hemorrhoids - GI following, appreciate assistance - Hemoglobin 10.5 g/dL on admission with very mild downtrend and no further bloody stools - Monitor further overnight and if hemoglobin remained stable, probable discharge home tomorrow - No plans for inpatient intervention per GI  CKD (chronic  kidney disease) stage 5, GFR less than 15 ml/min (HCC) - follows with nephrology; has a LUE fistula, set to be matured around Dec 2023 -Still makes adequate urine and was having urinary retention on admission requiring Foley catheter placement - Currently no indication for acute HD - Continue fluids and Foley.  Monitor output  Hypothyroidism - Likely subclinical hypothyroidism, TSH 8.181 - FT4 is normal; follow up TT3 - Continue Synthroid; no need for dose adjustment - repeat TSH in 4-6 weeks  Paget's disease of bone - Mixed lytic/sclerotic lesions noted in right hemipelvis, sacrum, L5 and 3 vertebral bodies on CT scan - Alk phos 170. Corrected Ca level normal - appears to be asymptomatic; patient already following with heme/onc as well outpatient - hold off on further inpatient workup  Cardiomegaly - Known cardiomegaly.  Last echo performed 04/05/2022 noting moderate concentric LVH.  EF 60 to 65%, mild MVR, mild TVR  Chronic diastolic CHF (congestive heart failure) (HCC) - no s/s exacerbation   DM2 (diabetes mellitus, type 2) ([place]) - continue SSI and CBG monitoring   History of DVT (deep vein thrombosis) - No longer on anticoagulation  Hyperlipidemia - Continue Lipitor  Essential hypertension - Resume amlodipine and Coreg - Hold hydralazine for now  Hypothermia-resolved as of 08/06/2022 - Transient on admission - Low suspicion for thyroid involvement   Old records reviewed in assessment of this patient  Antimicrobials:   DVT prophylaxis:  SCDs Start: 08/06/22 0923   Code Status:   Code Status: Full Code  Mobility Assessment (last 72 hours)     Mobility Assessment     Row Name 08/06/22 02:57:41  Does patient have an order for bedrest or is patient medically unstable No - Continue assessment       What is the highest level of mobility based on the progressive mobility assessment? Level 6 (Walks independently in room and hall) - Balance while  walking in room without assist - Complete                Barriers to discharge:  Disposition Plan:  Home Mon Status is: Inpt  Objective: Blood pressure (!) 168/73, pulse 78, temperature 97.7 F (36.5 C), temperature source Oral, resp. rate 17, SpO2 92 %.  Examination:  Physical Exam Constitutional:      Appearance: Normal appearance.  HENT:     Head: Normocephalic.     Mouth/Throat:     Mouth: Mucous membranes are moist.  Eyes:     Extraocular Movements: Extraocular movements intact.  Cardiovascular:     Rate and Rhythm: Normal rate and regular rhythm.  Pulmonary:     Effort: Pulmonary effort is normal.     Breath sounds: Normal breath sounds.  Abdominal:     General: Bowel sounds are normal.     Palpations: Abdomen is soft.  Musculoskeletal:        General: Normal range of motion.     Cervical back: Normal range of motion and neck supple.  Skin:    General: Skin is warm and dry.  Neurological:     General: No focal deficit present.     Mental Status: She is alert.  Psychiatric:        Mood and Affect: Mood normal.      Consultants:  GI  Procedures:    Data Reviewed: Results for orders placed or performed during the hospital encounter of 08/05/22 (from the past 24 hour(s))  Occult blood card to lab, stool     Status: Abnormal   Collection Time: 08/05/22  5:53 PM  Result Value Ref Range   Fecal Occult Bld POSITIVE (A) NEGATIVE  SARS Coronavirus 2 by RT PCR (hospital order, performed in [facility] hospital lab) *cepheid single result test* Anterior Nasal Swab     Status: None   Collection Time: 08/05/22  6:17 PM   Specimen: Anterior Nasal Swab  Result Value Ref Range   SARS Coronavirus 2 by RT PCR NEGATIVE NEGATIVE  CK     Status: Abnormal   Collection Time: 08/05/22  7:45 PM  Result Value Ref Range   Total CK 33 (L) 38 - 234 U/L  Magnesium     Status: Abnormal   Collection Time: 08/05/22  7:45 PM  Result Value Ref Range   Magnesium 2.6 (H) 1.7 -  2.4 mg/dL  Phosphorus     Status: None   Collection Time: 08/05/22  7:45 PM  Result Value Ref Range   Phosphorus 4.6 2.5 - 4.6 mg/dL  Protime-INR     Status: None   Collection Time: 08/05/22  7:45 PM  Result Value Ref Range   Prothrombin Time 14.5 11.4 - 15.2 seconds   INR 1.1 0.8 - 1.2  TSH     Status: Abnormal   Collection Time: 08/05/22  7:45 PM  Result Value Ref Range   TSH 8.181 (H) 0.350 - 4.500 uIU/mL  CBC     Status: Abnormal   Collection Time: 08/05/22  7:45 PM  Result Value Ref Range   WBC 4.5 4.0 - 10.5 K/uL   RBC 3.09 (L) 3.87 - 5.11 MIL/uL   Hemoglobin 9.8 (L) 12.0 -  15.0 g/dL   HCT 31.5 (L) 36.0 - 46.0 %   MCV 101.9 (H) 80.0 - 100.0 fL   MCH 31.7 26.0 - 34.0 pg   MCHC 31.1 30.0 - 36.0 g/dL   RDW 14.3 11.5 - 15.5 %   Platelets 230 150 - 400 K/uL   nRBC 0.0 0.0 - 0.2 %  Vitamin B12     Status: Abnormal   Collection Time: 08/05/22  7:45 PM  Result Value Ref Range   Vitamin B-12 915 (H) 180 - 914 pg/mL  Folate     Status: None   Collection Time: 08/05/22  7:45 PM  Result Value Ref Range   Folate 41.7 >5.9 ng/mL  Iron and TIBC     Status: Abnormal   Collection Time: 08/05/22  7:45 PM  Result Value Ref Range   Iron 91 28 - 170 ug/dL   TIBC 272 250 - 450 ug/dL   Saturation Ratios 34 (H) 10.4 - 31.8 %   UIBC 181 ug/dL  Ferritin     Status: None   Collection Time: 08/05/22  7:45 PM  Result Value Ref Range   Ferritin 82 11 - 307 ng/mL  Reticulocytes     Status: Abnormal   Collection Time: 08/05/22  7:45 PM  Result Value Ref Range   Retic Ct Pct 0.6 0.4 - 3.1 %   RBC. 3.04 (L) 3.87 - 5.11 MIL/uL   Retic Count, Absolute 18.2 (L) 19.0 - 186.0 K/uL   Immature Retic Fract 5.0 2.3 - 15.9 %  CBG monitoring, ED     Status: None   Collection Time: 08/05/22  8:42 PM  Result Value Ref Range   Glucose-Capillary 81 70 - 99 mg/dL  Lactic acid, plasma     Status: None   Collection Time: 08/05/22  8:45 PM  Result Value Ref Range   Lactic Acid, Venous 0.5 0.5 - 1.9  mmol/L  Sodium, urine, random     Status: None   Collection Time: 08/05/22 10:00 PM  Result Value Ref Range   Sodium, Ur 52 mmol/L  Urinalysis, Complete w Microscopic Urine, Catheterized     Status: Abnormal   Collection Time: 08/05/22 10:00 PM  Result Value Ref Range   Color, Urine STRAW (A) YELLOW   APPearance CLEAR CLEAR   Specific Gravity, Urine 1.006 1.005 - 1.030   pH 6.0 5.0 - 8.0   Glucose, UA NEGATIVE NEGATIVE mg/dL   Hgb urine dipstick NEGATIVE NEGATIVE   Bilirubin Urine NEGATIVE NEGATIVE   Ketones, ur NEGATIVE NEGATIVE mg/dL   Protein, ur 100 (A) NEGATIVE mg/dL   Nitrite NEGATIVE NEGATIVE   Leukocytes,Ua NEGATIVE NEGATIVE   RBC / HPF 0-5 0 - 5 RBC/hpf   WBC, UA 0-5 0 - 5 WBC/hpf   Bacteria, UA NONE SEEN NONE SEEN  Creatinine, urine, random     Status: None   Collection Time: 08/05/22 10:00 PM  Result Value Ref Range   Creatinine, Urine 41 mg/dL  Culture, blood (Routine X 2) w Reflex to ID Panel     Status: None (Preliminary result)   Collection Time: 08/05/22 10:15 PM   Specimen: BLOOD  Result Value Ref Range   Specimen Description      BLOOD SITE NOT SPECIFIED Performed at [facility], [address], [place], [place] [address]    Special Requests      BOTTLES DRAWN AEROBIC AND ANAEROBIC Blood Culture adequate volume Performed at [facility] [address] [place],  [place] [address]    Culture      NO GROWTH < 12 HOURS Performed at [facility] [address] [place], [place] [address]    Report Status PENDING   Culture, blood (Routine X 2) w Reflex to ID Panel     Status: None (Preliminary result)   Collection Time: 08/05/22 10:30 PM   Specimen: BLOOD  Result Value Ref Range   Specimen Description      BLOOD SITE NOT SPECIFIED Performed at [facility] [address] [place], [place] [address]    Special Requests      BOTTLES DRAWN AEROBIC AND ANAEROBIC Blood Culture adequate  volume Performed at [facility] [address], [place], [place] [address]    Culture      NO GROWTH < 12 HOURS Performed at [facility] [address], [place], [place] [address]    Report Status PENDING   CBG monitoring, ED     Status: None   Collection Time: 08/06/22 12:16 AM  Result Value Ref Range   Glucose-Capillary 72 70 - 99 mg/dL  CBC     Status: Abnormal   Collection Time: 08/06/22  2:35 AM  Result Value Ref Range   WBC 6.5 4.0 - 10.5 K/uL   RBC 3.03 (L) 3.87 - 5.11 MIL/uL   Hemoglobin 9.9 (L) 12.0 - 15.0 g/dL   HCT 31.2 (L) 36.0 - 46.0 %   MCV 103.0 (H) 80.0 - 100.0 fL   MCH 32.7 26.0 - 34.0 pg   MCHC 31.7 30.0 - 36.0 g/dL   RDW 14.6 11.5 - 15.5 %   Platelets 230 150 - 400 K/uL   nRBC 0.0 0.0 - 0.2 %  Prealbumin     Status: None   Collection Time: 08/06/22  5:00 AM  Result Value Ref Range   Prealbumin 25 18 - 38 mg/dL  CBG monitoring, ED     Status: None   Collection Time: 08/06/22  6:29 AM  Result Value Ref Range   Glucose-Capillary 71 70 - 99 mg/dL  CBG monitoring, ED     Status: None   Collection Time: 08/06/22  7:31 AM  Result Value Ref Range   Glucose-Capillary 81 70 - 99 mg/dL  CBC     Status: Abnormal   Collection Time: 08/06/22  7:45 AM  Result Value Ref Range   WBC 5.5 4.0 - 10.5 K/uL   RBC 3.10 (L) 3.87 - 5.11 MIL/uL   Hemoglobin 9.8 (L) 12.0 - 15.0 g/dL   HCT 32.2 (L) 36.0 - 46.0 %   MCV 103.9 (H) 80.0 - 100.0 fL   MCH 31.6 26.0 - 34.0 pg   MCHC 30.4 30.0 - 36.0 g/dL   RDW 14.5 11.5 - 15.5 %   Platelets 237 150 - 400 K/uL   nRBC 0.0 0.0 - 0.2 %  T4, free     Status: None   Collection Time: 08/06/22  7:45 AM  Result Value Ref Range   Free T4 1.09 0.61 - 1.12 ng/dL  Magnesium     Status: None   Collection Time: 08/06/22  9:58 AM  Result Value Ref Range   Magnesium 2.4 1.7 - 2.4 mg/dL  Renal function panel     Status: Abnormal   Collection Time: 08/06/22  9:58 AM  Result Value Ref Range   Sodium 143 135 - 145  mmol/L   Potassium 4.9 3.5 - 5.1 mmol/L   Chloride 111 98 -  111 mmol/L   CO2 19 (L) 22 - 32 mmol/L   Glucose, Bld 81 70 - 99 mg/dL   BUN 50 (H) 8 - 23 mg/dL   Creatinine, Ser 4.51 (H) 0.44 - 1.00 mg/dL   Calcium 9.2 8.9 - 10.3 mg/dL   Phosphorus 4.6 2.5 - 4.6 mg/dL   Albumin 3.3 (L) 3.5 - 5.0 g/dL   GFR, Estimated 9 (L) >60 mL/min   Anion gap 13 5 - 15  CBG monitoring, ED     Status: None   Collection Time: 08/06/22 10:31 AM  Result Value Ref Range   Glucose-Capillary 84 70 - 99 mg/dL  CBG monitoring, ED     Status: Abnormal   Collection Time: 08/06/22 12:19 PM  Result Value Ref Range   Glucose-Capillary 104 (H) 70 - 99 mg/dL  Hemoglobin and hematocrit, blood     Status: Abnormal   Collection Time: 08/06/22  2:10 PM  Result Value Ref Range   Hemoglobin 9.9 (L) 12.0 - 15.0 g/dL   HCT 31.8 (L) 36.0 - 46.0 %    I have Reviewed nursing notes, Vitals, and Lab results since pt's last encounter. Pertinent lab results : see above I have ordered test including BMP, CBC, Mg I have reviewed the last note from staff over past 24 hours I have discussed pt's care plan and test results with nursing staff, case manager  Time spent: Greater than 50% of the 55 minute visit was spent in counseling/coordination of care for the patient as laid out in the A&P.    LOS: 1 day   Dwyane Dee, MD [facility] 08/06/2022, 3:47 PM

## 2022-08-06 NOTE — ED Notes (Signed)
SPO2 went down to 89 while ambulating.

## 2022-08-06 NOTE — Assessment & Plan Note (Addendum)
-   Likely subclinical hypothyroidism, TSH 8.181 - FT4 is normal; TT3 low, 66, but not unexpected given rapid peripheral metabolism  - Continue Synthroid; no need for dose adjustment - repeat TSH in 4-6 weeks

## 2022-08-06 NOTE — Assessment & Plan Note (Signed)
Imagine have showed possible Paget's disease. Will ned further work up in AM

## 2022-08-06 NOTE — Assessment & Plan Note (Signed)
-  Mixed lytic/sclerotic lesions noted in right hemipelvis, sacrum, L5 and 3 vertebral bodies on CT scan - Alk phos 170. Corrected Ca level normal - appears to be asymptomatic; patient already following with heme/onc as well outpatient - hold off on further inpatient workup

## 2022-08-06 NOTE — Assessment & Plan Note (Addendum)
-   Patient presented with reports of bloody bowel movements at home prior to admission.  No further bowel movements since admission.  No associated abdominal pain nor any nausea/vomiting.  Patient also afebrile with no leukocytosis - Differential for etiology includes underlying diverticular bleed versus hemorrhoids - GI following, appreciate assistance. No plans for intervention per GI - Hemoglobin 10.5 g/dL on admission with very mild downtrend -Still passing some pink-tinged stools overnight and some hemoglobin drop.  Hopeful for further improvement with monitoring 1 more night - Hemoglobin 8.9 g/dL this morning - Continue trending hemoglobin and stools

## 2022-08-06 NOTE — Assessment & Plan Note (Signed)
-   Transient on admission - Low suspicion for thyroid involvement

## 2022-08-06 NOTE — ED Notes (Signed)
Pt c/o lower abdomen feeling "tight" and states that she had not urinated since the previous nurse used a catheter to drain her bladder around 2200 last night. Pt currently connected to the purwick suction, but there is no urine in the canister. Bladder scan used and 743m of urine detected. Will relay to the on-coming nurse.

## 2022-08-06 NOTE — Assessment & Plan Note (Signed)
-   continue SSI and CBG monitoring  

## 2022-08-06 NOTE — Assessment & Plan Note (Addendum)
-   Resume amlodipine and Coreg -Okay to resume hydralazine on 08/07/2022

## 2022-08-06 NOTE — Assessment & Plan Note (Addendum)
-   follows with nephrology; has a LUE fistula, set to be matured around Dec 2023 -Still makes adequate urine and was having urinary retention on admission requiring Foley catheter placement - Currently no indication for acute HD - d/c foley today and perform bladder scans

## 2022-08-06 NOTE — Consult Note (Signed)
Referring Provider:  ED Primary Care Physician:  Patient, No Pcp Per Primary Gastroenterologist: Unassigned  Reason for Consultation: Active bleeding  HPI: Norma Coleman is a 88 y.o. female with past medical history of chronic kidney disease plan to start dialysis, hypertension, chronic anemia, history of DVT, history of diastolic dysfunction presented to the hospital with bloody diarrhea .patient lives in [place] and currently visiting family in [place].  She is also followed by hematology for chronic anemia and has received EPO.  Blood work yesterday showed hemoglobin of 9.8.  Her hemoglobin is in range of 10-11 since last few months.  CMP showed mild elevated alkaline phosphatase 170 with normal LFTs.  CT scan Renal stone protocol yesterday showed no acute GI changes.  Gallstone without evidence of cholecystitis.  Diverticulosis.  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  Patient seen and examined at bedside.  She has been having upper respiratory symptoms for last few days along with some diarrhea.  Had 2 episodes of bloody stool mixed with the diarrhea yesterday.  No bleeding episode in last 24 hours.  Had some abdominal discomfort which has improved now.  According to daughter, patient has GI issues for many years.  She had colonoscopy done few years ago which was normal according to patient.  No report available to review.  Past Medical History:  Diagnosis Date   DVT (deep venous thrombosis) (HCC)    GI bleed due to NSAIDs    High cholesterol    Hypertension    Skin cancer    Thyroid disease     Past Surgical History:  Procedure Laterality Date   KNEE SURGERY     TONSILLECTOMY     TUBAL LIGATION      Prior to Admission medications   Medication Sig Start Date End Date Taking? Authorizing Provider  ALPRAZolam Duanne Moron) 1 MG tablet Take 1 mg by mouth at bedtime as needed for anxiety.   Yes [provider]  amLODipine (NORVASC) 10 MG tablet Take 10 mg by  mouth daily. 06/30/22  Yes [provider]  ascorbic acid (VITAMIN C) 500 MG tablet Take 500 mg by mouth daily.   Yes [provider]  atorvastatin (LIPITOR) 40 MG tablet Take 40 mg by mouth daily. 01/09/20  Yes [provider]  Biotin 5 MG CAPS Take 5 mg by mouth daily.   Yes [provider]  carvedilol (COREG) 3.125 MG tablet Take 1 tablet by mouth 2 (two) times daily with a meal. 06/30/22  Yes [provider]  cholecalciferol (VITAMIN D3) 25 MCG (1000 UNIT) tablet Take 1,000 Units by mouth daily.   Yes [provider]  furosemide (LASIX) 40 MG tablet Take 40 mg by mouth. 08/03/21  Yes [provider]  hydrALAZINE (APRESOLINE) 50 MG tablet Take 50 mg by mouth 3 (three) times daily. 04/28/22  Yes [provider]  levothyroxine (SYNTHROID) 75 MCG tablet Take 75 mcg by mouth daily before breakfast. 06/30/22  Yes [provider]  Multiple Vitamins-Minerals (MULTIVITAMIN WITH MINERALS) tablet Take 1 tablet by mouth daily.   Yes [provider]  sodium bicarbonate 650 MG tablet Take 650 mg by mouth 2 (two) times daily. 07/11/21  Yes [provider]  vitamin B-12 (CYANOCOBALAMIN) 100 MCG tablet Take 100 mcg by mouth daily.   Yes [provider]    Scheduled Meds:  insulin aspart  0-9 Units Subcutaneous Q4H   levothyroxine  100 mcg Oral QAC breakfast   Continuous Infusions: PRN Meds:.  Allergies  as of 08/05/2022 - Review Complete 08/05/2022  Allergen Reaction Noted   Ibuprofen  07/19/2018   Sulfa antibiotics  07/19/2018    History reviewed. No pertinent family history.  Social History   Socioeconomic History   Marital status: Legally Separated    Spouse name: Not on file   Number of children: Not on file   Years of education: Not on file   Highest education level: Not on file  Occupational History   Not on file  Tobacco Use   Smoking status: Never   Smokeless tobacco: Never   Substance and Sexual Activity   Alcohol use: Never   Drug use: Never   Sexual activity: Not on file  Other Topics Concern   Not on file  Social History Narrative   Not on file   Social Determinants of Health   Financial Resource Strain: Not on file  Food Insecurity: Not on file  Transportation Needs: Not on file  Physical Activity: Not on file  Stress: Not on file  Social Connections: Not on file  Intimate Partner Violence: Not on file    Review of Systems: All negative except as stated above in HPI.  Physical Exam: Vital signs: Vitals:   08/06/22 0703 08/06/22 0900  BP:  (!) 147/62  Pulse:  73  Resp:  12  Temp: 98.1 F (36.7 C)   SpO2:  94%   Last BM Date : 08/05/22 General:   Alert,  Well-developed, well-nourished, pleasant and cooperative in NAD HEENT - NS, AT, EOMI  Lungs: No visible respiratory distress Heart:  Regular rate and rhythm; no murmurs, clicks, rubs,  or gallops. Abdomen: Soft, nontender, nondistended, bowel sounds present, no peritoneal signs Rectal: Rectal exam performed in presence of chaperone and patient's daughter.  External skin tags noted.  Small amount of bright blood on the finger.  No rectal mass. Neuro -alert and oriented x 3 Psych -mood and affect normal  GI:  Lab Results: Recent Labs    08/05/22 1945 08/06/22 0235 08/06/22 0745  WBC 4.5 6.5 5.5  HGB 9.8* 9.9* 9.8*  HCT 31.5* 31.2* 32.2*  PLT 230 230 237   BMET Recent Labs    08/05/22 1411  NA 138  K 5.1  CL 109  CO2 19*  GLUCOSE 105*  BUN 50*  CREATININE 4.47*  CALCIUM 8.9   LFT Recent Labs    08/05/22 1411  PROT 7.0  ALBUMIN 3.6  AST 24  ALT 18  ALKPHOS 170*  BILITOT 0.7   PT/INR Recent Labs    08/05/22 1945  LABPROT 14.5  INR 1.1     Studies/Results: CT RENAL STONE STUDY  Result Date: 08/05/2022 CLINICAL DATA:  Abdominal/flank pain, stone suspected.  Hematuria. EXAM: CT ABDOMEN AND PELVIS WITHOUT CONTRAST TECHNIQUE: Multidetector CT imaging  of the abdomen and pelvis was performed following the standard protocol without IV contrast. RADIATION DOSE REDUCTION: This exam was performed according to the departmental dose-optimization program which includes automated exposure control, adjustment of the mA and/or kV according to patient size and/or use of iterative reconstruction technique. COMPARISON:  None Available. FINDINGS: Lower chest: Mild left basilar atelectasis. No acute pulmonary process. Hepatobiliary: No focal liver abnormality is seen. Multiple dependent gallstones without gallbladder wall thickening, or biliary dilatation. Pancreas: Unremarkable. No pancreatic ductal dilatation or surrounding inflammatory changes. Spleen: Normal in size without focal abnormality. Adrenals/Urinary Tract: Adrenal glands are unremarkable. Bilateral exophytic renal cyst, left upper pole cyst measuring 2 cm and right interpolar renal cyst measuring 1.4  cm measuring complex fluid in density, likely representing a proteinaceous/hemorrhagic cysts. No evidence of nephrolithiasis or hydronephrosis. Bladder is unremarkable. Stomach/Bowel: Stomach is within normal limits. Appendix appears normal. Scattered colonic diverticulosis prominent in the sigmoid colon without evidence of acute diverticulitis. No evidence of bowel wall thickening, distention, or inflammatory changes. Vascular/Lymphatic: Aortic atherosclerosis. No enlarged abdominal or pelvic lymph nodes. Reproductive: Uterus and bilateral adnexa are unremarkable. Other: No abdominal wall hernia or abnormality. No abdominopelvic ascites. Musculoskeletal: There is osseous enlargement with coarse trabeculation mixed lytic/sclerotic lesions of the right hemipelvis, sacrum, L5 and 3 vertebral bodies. These may represent Paget's disease. Correlate with prior clinical history. No acute fracture. IMPRESSION: 1. No evidence of nephrolithiasis or hydronephrosis. 2. Bilateral renal cysts, which appear complex, likely  representing proteinaceous/hemorrhagic cysts. 3. Cholelithiasis without evidence of acute cholecystitis. 4. Scattered colonic diverticulosis without evidence of acute diverticulitis. 5. Normal appendix. No evidence of colitis or diverticulitis. 6. Osseous enlargement with coarse trabeculation, mixed lytic/sclerotic lesions of the right hemipelvis, sacrum, L5 and 3 vertebral bodies. 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 Correlate with prior clinical history. 7. Aortic atherosclerosis. Aortic Atherosclerosis (ICD10-I70.0). Electronically Signed   By: Keane Police D.O.   On: 08/05/2022 21:44   DG Chest Port 1 View  Result Date: 08/05/2022 CLINICAL DATA:  Shortness of breath. EXAM: PORTABLE CHEST 1 VIEW COMPARISON:  None Available. FINDINGS: The cardiac silhouette is moderately enlarged. There is moderate severity calcification of the aortic arch. The lungs are hyperinflated. Biapical pleural thickening is seen. There is no evidence of an acute infiltrate, pleural effusion or pneumothorax. The visualized skeletal structures are unremarkable. IMPRESSION: Cardiomegaly without evidence of acute or active cardiopulmonary disease. Electronically Signed   By: Virgina Norfolk M.D.   On: 08/05/2022 20:09    Impression/Plan: -2 episodes of rectal bleeding in setting of recent diarrheal illness.  No further bleeding episodes in last 24 hours.  Diarrhea also improved.  Likely hemorrhoidal bleeding. -End-stage renal disease.  Plan to start hemodialysis soon -Chronic anemia.  Recommendations ------------------------- -No further bleeding episodes.  No further inpatient GI workup planned at this time.  Patient was advised to follow-up with GI doctor after she goes back to her hometown. -Start soft diet and advance as tolerated. -GI will sign off.  Call us back if needed    LOS: 1 day   Otis Brace  MD, [place] 08/06/2022, 9:12 AM  Contact #  [phone]

## 2022-08-06 NOTE — Assessment & Plan Note (Signed)
-  Continue Lipitor °

## 2022-08-06 NOTE — Assessment & Plan Note (Signed)
-   Known cardiomegaly.  Last echo performed 04/05/2022 noting moderate concentric LVH.  EF 60 to 65%, mild MVR, mild TVR

## 2022-08-06 NOTE — Hospital Course (Signed)
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 Patient is in town visiting from [place] for the holiday.  Due to ongoing bloody bowel movements, she presented for further evaluation. She could not remember when her last colonoscopy was and denies any significant recurrent history of bloody bowel movements but does endorse a remote history of one that she can remember.  Details unknown regarding etiology or treatment. CT performed on admission showed underlying scattered diverticulosis. Hemoglobin 10.5 g/dL on admission.  GI was also consulted on admission. She also follows outpatient with hematology for EPO injections in setting of her anemia of chronic disease.

## 2022-08-06 NOTE — Assessment & Plan Note (Signed)
-   no s/s exacerbation 

## 2022-08-06 NOTE — Assessment & Plan Note (Signed)
-   No longer on anticoagulation 

## 2022-08-07 DIAGNOSIS — N185 Chronic kidney disease, stage 5: Secondary | ICD-10-CM | POA: Diagnosis present

## 2022-08-07 DIAGNOSIS — K5900 Constipation, unspecified: Secondary | ICD-10-CM

## 2022-08-07 DIAGNOSIS — K922 Gastrointestinal hemorrhage, unspecified: Secondary | ICD-10-CM | POA: Diagnosis not present

## 2022-08-07 DIAGNOSIS — N179 Acute kidney failure, unspecified: Secondary | ICD-10-CM | POA: Diagnosis present

## 2022-08-07 LAB — CBC WITH DIFFERENTIAL/PLATELET
Abs Immature Granulocytes: 0.02 10*3/uL (ref 0.00–0.07)
Basophils Absolute: 0 10*3/uL (ref 0.0–0.1)
Basophils Relative: 0 %
Eosinophils Absolute: 0.1 10*3/uL (ref 0.0–0.5)
Eosinophils Relative: 1 %
HCT: 32 % — ABNORMAL LOW (ref 36.0–46.0)
Hemoglobin: 9.8 g/dL — ABNORMAL LOW (ref 12.0–15.0)
Immature Granulocytes: 0 %
Lymphocytes Relative: 11 %
Lymphs Abs: 0.9 10*3/uL (ref 0.7–4.0)
MCH: 31.7 pg (ref 26.0–34.0)
MCHC: 30.6 g/dL (ref 30.0–36.0)
MCV: 103.6 fL — ABNORMAL HIGH (ref 80.0–100.0)
Monocytes Absolute: 0.7 10*3/uL (ref 0.1–1.0)
Monocytes Relative: 8 %
Neutro Abs: 6.1 10*3/uL (ref 1.7–7.7)
Neutrophils Relative %: 80 %
Platelets: 229 10*3/uL (ref 150–400)
RBC: 3.09 MIL/uL — ABNORMAL LOW (ref 3.87–5.11)
RDW: 14.4 % (ref 11.5–15.5)
WBC: 7.8 10*3/uL (ref 4.0–10.5)
nRBC: 0 % (ref 0.0–0.2)

## 2022-08-07 LAB — BASIC METABOLIC PANEL
Anion gap: 11 (ref 5–15)
BUN: 49 mg/dL — ABNORMAL HIGH (ref 8–23)
CO2: 18 mmol/L — ABNORMAL LOW (ref 22–32)
Calcium: 9.2 mg/dL (ref 8.9–10.3)
Chloride: 113 mmol/L — ABNORMAL HIGH (ref 98–111)
Creatinine, Ser: 3.73 mg/dL — ABNORMAL HIGH (ref 0.44–1.00)
GFR, Estimated: 11 mL/min — ABNORMAL LOW (ref >60)
Glucose, Bld: 101 mg/dL — ABNORMAL HIGH (ref 70–99)
Potassium: 4.7 mmol/L (ref 3.5–5.1)
Sodium: 142 mmol/L (ref 135–145)

## 2022-08-07 LAB — GLUCOSE, CAPILLARY
Glucose-Capillary: 112 mg/dL — ABNORMAL HIGH (ref 70–99)
Glucose-Capillary: 130 mg/dL — ABNORMAL HIGH (ref 70–99)
Glucose-Capillary: 117 mg/dL — ABNORMAL HIGH (ref 70–99)

## 2022-08-07 LAB — HEMOGLOBIN A1C
Hgb A1c MFr Bld: 5.5 % (ref 4.8–5.6)
Mean Plasma Glucose: 111 mg/dL

## 2022-08-07 LAB — CBG MONITORING, ED: Glucose-Capillary: 114 mg/dL — ABNORMAL HIGH (ref 70–99)

## 2022-08-07 LAB — OSMOLALITY, URINE: Osmolality, Ur: 225 mOsm/kg — ABNORMAL LOW (ref 300–900)

## 2022-08-07 LAB — MAGNESIUM: Magnesium: 2.5 mg/dL — ABNORMAL HIGH (ref 1.7–2.4)

## 2022-08-07 MED ORDER — SIMETHICONE 80 MG PO CHEW
80.0000 mg | CHEWABLE_TABLET | Freq: Once | ORAL | Status: DC
  Filled 2022-08-07: qty 1

## 2022-08-07 MED ORDER — ONDANSETRON HCL 4 MG/2ML IJ SOLN
4.0000 mg | Freq: Four times a day (QID) | INTRAMUSCULAR | Status: DC | PRN
  Administered 2022-08-07: 4 mg via INTRAVENOUS
  Filled 2022-08-07: qty 2

## 2022-08-07 MED ORDER — HYDRALAZINE HCL 50 MG PO TABS
50.0000 mg | ORAL_TABLET | Freq: Three times a day (TID) | ORAL | Status: DC
  Administered 2022-08-07 – 2022-08-09 (×5): 50 mg via ORAL
  Filled 2022-08-07 (×6): qty 1

## 2022-08-07 MED ORDER — POLYETHYLENE GLYCOL 3350 17 G PO PACK
17.0000 g | PACK | Freq: Every day | ORAL | Status: DC
  Administered 2022-08-07 – 2022-08-08 (×2): 17 g via ORAL
  Filled 2022-08-07 (×2): qty 1

## 2022-08-07 MED ORDER — MAGNESIUM CITRATE PO SOLN
1.0000 | Freq: Once | ORAL | Status: AC
  Administered 2022-08-07: 1 via ORAL
  Filled 2022-08-07: qty 296

## 2022-08-07 MED ORDER — ALUM & MAG HYDROXIDE-SIMETH 200-200-20 MG/5ML PO SUSP
30.0000 mL | Freq: Once | ORAL | Status: AC
  Administered 2022-08-07: 30 mL via ORAL
  Filled 2022-08-07: qty 30

## 2022-08-07 MED ORDER — SENNOSIDES-DOCUSATE SODIUM 8.6-50 MG PO TABS
1.0000 | ORAL_TABLET | Freq: Two times a day (BID) | ORAL | Status: DC
  Administered 2022-08-07 – 2022-08-08 (×2): 1 via ORAL
  Filled 2022-08-07 (×3): qty 1

## 2022-08-07 NOTE — TOC Progression Note (Signed)
Transition of Care [facility]) - Progression Note    Patient Details  Name: Sacora Hawbaker MRN: [id number] Date of Birth: 06/02/1933  Transition of Care [facility]) CM/SW [place], RN Phone Number:[phone]  08/07/2022, 4:02 PM  Clinical Narrative:     Transition of Care (TOC) Screening Note   Patient Details  Name: Acadia Thammavong Date of Birth: Jun 18, 1933   Transition of Care [facility]) CM/SW Contact:    Angelita Ingles, RN Phone Number:[phone]  08/07/2022, 4:03 PM    Transition of Care Department [facility]) has reviewed patient and no TOC needs have been identified at this time. We will continue to monitor patient advancement through interdisciplinary progression rounds. If new patient transition needs arise, please place a TOC consult.          Expected Discharge Plan and Services                                                 Social Determinants of Health (SDOH) Interventions    Readmission Risk Interventions     No data to display

## 2022-08-07 NOTE — Evaluation (Signed)
Occupational Therapy Evaluation Patient Details Name: Norma Coleman MRN: [id number] DOB: [birth year] Today's Date: 08/07/2022   History of Present Illness Patient is a 88 year old female who presented with ongoing bloody bowel movements.CT revealed scattered diverticulosis.  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 Patient was min A/min guard for ADL tasks especially when moving without RW. Patient's daughter reported plan was to transition home with her for a while v.s patients house with large amount of steps to get inside. Patient was noted to have decreased functional activity tolerance, decreased endurance, decreased standing balance, decreased safety awareness, and decreased knowledge of AD/AE impacting participation in ADLs. Patient would continue to benefit from skilled OT services at this time while admitted and after d/c to address noted deficits in order to improve overall safety and independence in ADLs.       Recommendations for follow up therapy are one component of a multi-disciplinary discharge planning process, led by the attending physician.  Recommendations may be updated based on patient status, additional functional criteria and insurance authorization.   Follow Up Recommendations  No OT follow up     Assistance Recommended at Discharge PRN  Patient can return home with the following A little help with bathing/dressing/bathroom;Assistance with cooking/housework;Assist for transportation;Help with stairs or ramp for entrance    Functional Status Assessment  Patient has had a recent decline in their functional status and demonstrates the ability to make significant improvements in function in a reasonable and predictable amount of time.  Equipment  Recommendations  None recommended by OT    Recommendations for Other Services       Precautions / Restrictions Precautions Precautions: Fall Restrictions Weight Bearing Restrictions: No      Mobility Bed Mobility Overal bed mobility: Modified Independent                  Transfers Overall transfer level: Needs assistance Equipment used: Rolling walker (2 wheels) Transfers: Sit to/from Stand Sit to Stand: Supervision           General transfer comment: manages  standing  at Rw.      Balance                                           ADL either performed or assessed with clinical judgement   ADL Overall ADL's : Needs assistance/impaired Eating/Feeding: Supervision/ safety;Sitting   Grooming: Wash/dry face;Wash/dry hands;Min guard;Standing;Oral care;Brushing hair Grooming Details (indicate cue type and reason): at sink with cues for proper positioning of RW Upper Body Bathing: Min guard;Sitting   Lower Body Bathing: Min guard;Sitting/lateral leans   Upper Body Dressing : Min guard;Sitting   Lower Body Dressing: Min guard;Sitting/lateral leans   Toilet Transfer: Minimal assistance Toilet Transfer Details (indicate cue type and reason): with no AD with noted attempts to furniture walk. patient was noted to have red stool into [facility] in bathroom with nurse made aware and stool left for nurse to assess. dauther and patient made aware as well Toileting- Water quality scientist and Hygiene: Min guard;Sit to/from stand Toileting - Clothing Manipulation Details (indicate cue type and reason): with increased time     Functional mobility during ADLs:  Minimal assistance General ADL Comments: with no AD     Vision Patient Visual Report: No change from baseline       Perception     Praxis      Pertinent Vitals/Pain Pain Assessment Pain Assessment: No/denies pain     Hand Dominance Right   Extremity/Trunk Assessment Upper Extremity  Assessment Upper Extremity Assessment: Overall WFL for tasks assessed   Lower Extremity Assessment Lower Extremity Assessment: Overall WFL for tasks assessed   Cervical / Trunk Assessment Cervical / Trunk Assessment: Normal   Communication Communication Communication: No difficulties   Cognition Arousal/Alertness: Awake/alert Behavior During Therapy: WFL for tasks assessed/performed Overall Cognitive Status: Within Functional Limits for tasks assessed                                       General Comments       Exercises     Shoulder Instructions      Home Living Family/patient expects to be discharged to:: Private residence Living Arrangements: Alone Available Help at Discharge: Family Type of Home: House Home Access: Stairs to enter Technical brewer of Steps: 14 Entrance Stairs-Rails: Right Home Layout: One level     Bathroom Shower/Tub: Walk-in shower         Home Equipment: Kasandra Knudsen - single point   Additional Comments: patients daughter reported that plan is for patient to transition to daughters house with 5 STE,walk in shower.      Prior Functioning/Environment Prior Level of Function : Independent/Modified Independent             Mobility Comments: tends to hold onto  thigs, still drives          OT Problem List: Decreased range of motion;Impaired balance (sitting and/or standing);Decreased safety awareness;Decreased knowledge of precautions;Decreased knowledge of use of DME or AE;Decreased activity tolerance      OT Treatment/Interventions: Self-care/ADL training;Energy conservation;Therapeutic exercise;DME and/or AE instruction;Neuromuscular education;Therapeutic activities;Patient/family education;Balance training    OT Goals(Current goals can be found in the care plan section) Acute Rehab OT Goals Patient Stated Goal: to get better OT Goal Formulation: With patient/family Time For Goal Achievement: 08/21/22 Potential  to Achieve Goals: Fair  OT Frequency: Min 2X/week    Co-evaluation   Reason for Co-Treatment: To address functional/ADL transfers PT goals addressed during session: Mobility/safety with mobility OT goals addressed during session: ADL's and self-care      AM-PAC OT "6 Clicks" Daily Activity     Outcome Measure Help from another person eating meals?: None Help from another person taking care of personal grooming?: None Help from another person toileting, which includes using toliet, bedpan, or urinal?: A Little Help from another person bathing (including washing, rinsing, drying)?: A Little Help from another person to put on and taking off regular upper body clothing?: A Little Help from another person to put on and taking off regular lower body clothing?: A Little 6 Click Score: 20   End of Session Equipment Utilized During Treatment: Gait belt;Rolling walker (2 wheels) Nurse Communication: Mobility status;Other (comment) (redness in stool)  Activity Tolerance: Patient tolerated treatment well Patient left: in chair;with call bell/phone within reach;with family/visitor present  OT Visit Diagnosis: Unsteadiness on feet (R26.81);Other abnormalities of gait and mobility (R26.89)                Time: 1340-1406 OT Time Calculation (min): 26 min Charges:  OT General Charges $OT  Visit: 1 Visit OT Evaluation $OT Eval Moderate Complexity: 1 Mod  Herbert Aguinaldo OTR/L, MS Acute Rehabilitation Department Office# [phone]   Willa Rough 08/07/2022, 3:57 PM

## 2022-08-07 NOTE — Progress Notes (Signed)
PT Cancellation Note  Patient Details Name: Norma Coleman MRN: [id number] DOB: October 25, 1932   Cancelled Treatment:    Reason Eval/Treat Not Completed: Fatigue/lethargy limiting ability to participate, per EMT, patient sleeping and waiting to receive magnesium. Will check back another time.  [place] Office [phone] Weekend [phone]  Claretha Cooper 08/07/2022, 11:46 AM

## 2022-08-07 NOTE — Assessment & Plan Note (Addendum)
-   Minimal relief from simethicone and Maalox on admission - CT shows underlying significant stool burden -Aggressive laxative regimen on 08/07/2022 was very effective

## 2022-08-07 NOTE — Progress Notes (Signed)
Progress Note    Norma Coleman   FOY:[id number]  DOB: Oct 20, 1932  DOA: 08/05/2022     2 PCP: Patient, No Pcp Per  Initial CC: blood stools  Hospital Course: Norma Coleman is an 88 yo female with PMH CKD 5 (LUE fistula in place, set to mature ~08/2022), GERD, sciatica, anemia of chronic disease, HTN, DMII, OA, diverticulitis, HLD, mitral regurg, senile osteoporosis who presented to the ER with bloody stools. Patient is in town visiting from [place] for the holiday.  Due to ongoing bloody bowel movements, she presented for further evaluation. She could not remember when her last colonoscopy was and denies any significant recurrent history of bloody bowel movements but does endorse a remote history of one that she can remember.  Details unknown regarding etiology or treatment. CT performed on admission showed underlying scattered diverticulosis. Hemoglobin 10.5 g/dL on admission.  GI was also consulted on admission. She also follows outpatient with hematology for EPO injections in setting of her anemia of chronic disease.  Interval History:  Patient had ongoing gas pains and abdominal discomfort overnight.  Did not sleep well due to this.  Reviewed CT findings with patient showing underlying stool burden.  We will trial aggressive laxative regimen today to see if helps.  Assessment and Plan: * Lower GI bleed - Patient presented with reports of bloody bowel movements at home prior to admission.  No further bowel movements since admission.  No associated abdominal pain nor any nausea/vomiting.  Patient also afebrile with no leukocytosis - Differential for etiology includes underlying diverticular bleed versus hemorrhoids - GI following, appreciate assistance - Hemoglobin 10.5 g/dL on admission with very mild downtrend - Monitor further overnight and if hemoglobin remains stable, probable discharge home tomorrow - monitor stools - No plans for inpatient intervention per GI  CKD (chronic  kidney disease) stage 5, GFR less than 15 ml/min (HCC) - follows with nephrology; has a LUE fistula, set to be matured around Dec 2023 -Still makes adequate urine and was having urinary retention on admission requiring Foley catheter placement - Currently no indication for acute HD - d/c foley today and perform bladder scans  Hypothyroidism - Likely subclinical hypothyroidism, TSH 8.181 - FT4 is normal; follow up TT3 - Continue Synthroid; no need for dose adjustment - repeat TSH in 4-6 weeks  Constipation - Minimal relief from simethicone and Maalox overnight - CT shows underlying significant stool burden - Aggressive laxative regimen initiated for today  Paget's disease of bone - Mixed lytic/sclerotic lesions noted in right hemipelvis, sacrum, L5 and 3 vertebral bodies on CT scan - Alk phos 170. Corrected Ca level normal - appears to be asymptomatic; patient already following with heme/onc as well outpatient - hold off on further inpatient workup  Cardiomegaly - Known cardiomegaly.  Last echo performed 04/05/2022 noting moderate concentric LVH.  EF 60 to 65%, mild MVR, mild TVR  Chronic diastolic CHF (congestive heart failure) (HCC) - no s/s exacerbation   DM2 (diabetes mellitus, type 2) ([place]) - continue SSI and CBG monitoring   History of DVT (deep vein thrombosis) - No longer on anticoagulation  Hyperlipidemia - Continue Lipitor  Essential hypertension - Resume amlodipine and Coreg -Okay to resume hydralazine on 08/07/2022  Hypothermia-resolved as of 08/06/2022 - Transient on admission - Low suspicion for thyroid involvement   Old records reviewed in assessment of this patient  Antimicrobials:   DVT prophylaxis:  SCDs Start: 08/06/22 7672   Code Status:   Code Status: Full Code  Mobility  Assessment (last 72 hours)     Mobility Assessment     Row Name 08/07/22 1520 08/06/22 22:44:01 08/06/22 02:57:41       Does patient have an order for bedrest or is  patient medically unstable -- No - Continue assessment No - Continue assessment     What is the highest level of mobility based on the progressive mobility assessment? Level 5 (Walks with assist in room/hall) - Balance while stepping forward/back and can walk in room with assist - Complete Level 6 (Walks independently in room and hall) - Balance while walking in room without assist - Complete Level 6 (Walks independently in room and hall) - Balance while walking in room without assist - Complete              Barriers to discharge:  Disposition Plan:  Home Tues Status is: Inpt  Objective: Blood pressure (!) 158/71, pulse 71, temperature (!) 97.4 F (36.3 C), temperature source Oral, resp. rate 18, SpO2 92 %.  Examination:  Physical Exam Constitutional:      Appearance: Normal appearance.  HENT:     Head: Normocephalic.     Mouth/Throat:     Mouth: Mucous membranes are moist.  Eyes:     Extraocular Movements: Extraocular movements intact.  Cardiovascular:     Rate and Rhythm: Normal rate and regular rhythm.  Pulmonary:     Effort: Pulmonary effort is normal.     Breath sounds: Normal breath sounds.  Abdominal:     General: Bowel sounds are normal.     Palpations: Abdomen is soft.  Musculoskeletal:        General: Normal range of motion.     Cervical back: Normal range of motion and neck supple.  Skin:    General: Skin is warm and dry.  Neurological:     General: No focal deficit present.     Mental Status: She is alert.  Psychiatric:        Mood and Affect: Mood normal.      Consultants:  GI  Procedures:    Data Reviewed: Results for orders placed or performed during the hospital encounter of 08/05/22 (from the past 24 hour(s))  CBG monitoring, ED     Status: None   Collection Time: 08/06/22  5:45 PM  Result Value Ref Range   Glucose-Capillary 87 70 - 99 mg/dL  CBG monitoring, ED     Status: None   Collection Time: 08/06/22 10:27 PM  Result Value Ref Range    Glucose-Capillary 84 70 - 99 mg/dL  Basic metabolic panel     Status: Abnormal   Collection Time: 08/07/22  5:47 AM  Result Value Ref Range   Sodium 142 135 - 145 mmol/L   Potassium 4.7 3.5 - 5.1 mmol/L   Chloride 113 (H) 98 - 111 mmol/L   CO2 18 (L) 22 - 32 mmol/L   Glucose, Bld 101 (H) 70 - 99 mg/dL   BUN 49 (H) 8 - 23 mg/dL   Creatinine, Ser 3.73 (H) 0.44 - 1.00 mg/dL   Calcium 9.2 8.9 - 10.3 mg/dL   GFR, Estimated 11 (L) >60 mL/min   Anion gap 11 5 - 15  CBC with Differential/Platelet     Status: Abnormal   Collection Time: 08/07/22  5:47 AM  Result Value Ref Range   WBC 7.8 4.0 - 10.5 K/uL   RBC 3.09 (L) 3.87 - 5.11 MIL/uL   Hemoglobin 9.8 (L) 12.0 - 15.0 g/dL   HCT  32.0 (L) 36.0 - 46.0 %   MCV 103.6 (H) 80.0 - 100.0 fL   MCH 31.7 26.0 - 34.0 pg   MCHC 30.6 30.0 - 36.0 g/dL   RDW 14.4 11.5 - 15.5 %   Platelets 229 150 - 400 K/uL   nRBC 0.0 0.0 - 0.2 %   Neutrophils Relative % 80 %   Neutro Abs 6.1 1.7 - 7.7 K/uL   Lymphocytes Relative 11 %   Lymphs Abs 0.9 0.7 - 4.0 K/uL   Monocytes Relative 8 %   Monocytes Absolute 0.7 0.1 - 1.0 K/uL   Eosinophils Relative 1 %   Eosinophils Absolute 0.1 0.0 - 0.5 K/uL   Basophils Relative 0 %   Basophils Absolute 0.0 0.0 - 0.1 K/uL   Immature Granulocytes 0 %   Abs Immature Granulocytes 0.02 0.00 - 0.07 K/uL  Magnesium     Status: Abnormal   Collection Time: 08/07/22  5:47 AM  Result Value Ref Range   Magnesium 2.5 (H) 1.7 - 2.4 mg/dL  CBG monitoring, ED     Status: Abnormal   Collection Time: 08/07/22  7:40 AM  Result Value Ref Range   Glucose-Capillary 114 (H) 70 - 99 mg/dL  Glucose, capillary     Status: Abnormal   Collection Time: 08/07/22 12:53 PM  Result Value Ref Range   Glucose-Capillary 112 (H) 70 - 99 mg/dL   Comment 1 Notify RN    Comment 2 Document in Chart     I have Reviewed nursing notes, Vitals, and Lab results since pt's last encounter. Pertinent lab results : see above I have ordered test including  BMP, CBC, Mg I have reviewed the last note from staff over past 24 hours I have discussed pt's care plan and test results with nursing staff, case manager  Time spent: Greater than 50% of the 55 minute visit was spent in counseling/coordination of care for the patient as laid out in the A&P.    LOS: 2 days   Dwyane Dee, MD [facility] 08/07/2022, 3:49 PM

## 2022-08-07 NOTE — Evaluation (Signed)
Physical Therapy Evaluation Patient Details Name: Norma Coleman MRN: [id number] DOB: 1932/12/19 Today's Date: 08/07/2022  History of Present Illness  s. Norma Coleman is an 88 yo female with PMH CKD 5 (LUE fistula in place, set to mature ~08/2022), GERD, sciatica, anemia of chronic disease, HTN, DMII, OA, diverticulitis, HLD, mitral regurg, senile osteoporosis who presented to the ER 08/05/22  with bloody stools. CT performed on admission showed underlying scattered diverticulosis.  Clinical Impression  Pt admitted with above diagnosis.  Pt currently with functional limitations due to the deficits listed below (see PT Problem List). Pt will benefit from skilled PT to increase their independence and safety with mobility to allow discharge to the venue listed below.     The patient  ambulated x 120' using Rw. Per daughter, she would like to see patient  try rollator.  Reportas patient tends to hold onto  items, grocery cart, etc.  Patient  visiting daughter  and will plan to stay with her temporarily.  Plan to  assess patient  using a  rollator.      Recommendations for follow up therapy are one component of a multi-disciplinary discharge planning process, led by the attending physician.  Recommendations may be updated based on patient status, additional functional criteria and insurance authorization.  Follow Up Recommendations No PT follow up      Assistance Recommended at Discharge PRN  Patient can return home with the following  Help with stairs or ramp for entrance;Assistance with cooking/housework;Assist for transportation    Equipment Recommendations Rollator (4 wheels) (rollator)  Recommendations for Other Services       Functional Status Assessment Patient has had a recent decline in their functional status and demonstrates the ability to make significant improvements in function in a reasonable and predictable amount of time.     Precautions / Restrictions Precautions Precautions:  Fall      Mobility  Bed Mobility Overal bed mobility: Modified Independent                  Transfers Overall transfer level: Needs assistance Equipment used: Rolling walker (2 wheels) Transfers: Sit to/from Stand Sit to Stand: Supervision           General transfer comment: manages  standing  at Rw.    Ambulation/Gait Ambulation/Gait assistance: Min guard Gait Distance (Feet): 120 Feet Assistive device: Rolling walker (2 wheels) Gait Pattern/deviations: Step-through pattern Gait velocity: decr     General Gait Details: gait is steady with use of Rw, encouraged to use it  Stairs            Wheelchair Mobility    Modified Rankin (Stroke Patients Only)       Balance Overall balance assessment: Mild deficits observed, not formally tested                                           Pertinent Vitals/Pain Pain Assessment Pain Assessment: No/denies pain    Home Living Family/patient expects to be discharged to:: Private residence Living Arrangements: Alone Available Help at Discharge: Family Type of Home: House Home Access: Stairs to enter Entrance Stairs-Rails: Right Entrance Stairs-Number of Steps: 14   Home Layout: One level Home Equipment: Cane - single point Additional Comments: patients daughter reported that plan is for patient to transition to daughters house with 5 STE,walk in shower.    Prior Function Prior Level of  Function : Independent/Modified Independent             Mobility Comments: tends to hold onto  thigs, still drives       Hand Dominance   Dominant Hand: Right    Extremity/Trunk Assessment   Upper Extremity Assessment Upper Extremity Assessment: Overall WFL for tasks assessed    Lower Extremity Assessment Lower Extremity Assessment: Overall WFL for tasks assessed    Cervical / Trunk Assessment Cervical / Trunk Assessment: Normal  Communication   Communication: No difficulties   Cognition Arousal/Alertness: Awake/alert Behavior During Therapy: WFL for tasks assessed/performed Overall Cognitive Status: Within Functional Limits for tasks assessed                                          General Comments      Exercises     Assessment/Plan    PT Assessment Patient needs continued PT services  PT Problem List Decreased mobility;Decreased activity tolerance;Decreased safety awareness       PT Treatment Interventions DME instruction;Therapeutic activities;Gait training;Therapeutic exercise;Patient/family education;Functional mobility training;Balance training    PT Goals (Current goals can be found in the Care Plan section)  Acute Rehab PT Goals Patient Stated Goal: to go home PT Goal Formulation: With patient/family Time For Goal Achievement: 08/21/22 Potential to Achieve Goals: Good    Frequency Min 3X/week     Co-evaluation PT/OT/SLP Co-Evaluation/Treatment: Yes Reason for Co-Treatment: To address functional/ADL transfers PT goals addressed during session: Mobility/safety with mobility OT goals addressed during session: ADL's and self-care       AM-PAC PT "6 Clicks" Mobility  Outcome Measure Help needed turning from your back to your side while in a flat bed without using bedrails?: None Help needed moving from lying on your back to sitting on the side of a flat bed without using bedrails?: None Help needed moving to and from a bed to a chair (including a wheelchair)?: A Little Help needed standing up from a chair using your arms (e.g., wheelchair or bedside chair)?: A Little Help needed to walk in hospital room?: A Little Help needed climbing 3-5 steps with a railing? : A Lot 6 Click Score: 19    End of Session Equipment Utilized During Treatment: Gait belt Activity Tolerance: Patient tolerated treatment well Patient left:  (with OT) Nurse Communication: Mobility status PT Visit Diagnosis: Unsteadiness on feet (R26.81)     Time: 5945-8592 PT Time Calculation (min) (ACUTE ONLY): 19 min   Charges:   PT Evaluation $PT Eval Low Complexity: 1 Low          [place] Office [phone] Weekend pager-[phone]'  Claretha Cooper 08/07/2022, 3:22 PM

## 2022-08-08 DIAGNOSIS — E039 Hypothyroidism, unspecified: Secondary | ICD-10-CM | POA: Diagnosis not present

## 2022-08-08 DIAGNOSIS — K922 Gastrointestinal hemorrhage, unspecified: Secondary | ICD-10-CM | POA: Diagnosis not present

## 2022-08-08 DIAGNOSIS — K5900 Constipation, unspecified: Secondary | ICD-10-CM | POA: Diagnosis not present

## 2022-08-08 DIAGNOSIS — N185 Chronic kidney disease, stage 5: Secondary | ICD-10-CM | POA: Diagnosis not present

## 2022-08-08 LAB — CBC WITH DIFFERENTIAL/PLATELET
Abs Immature Granulocytes: 0.03 10*3/uL (ref 0.00–0.07)
Basophils Absolute: 0 10*3/uL (ref 0.0–0.1)
Basophils Relative: 0 %
Eosinophils Absolute: 0.1 10*3/uL (ref 0.0–0.5)
Eosinophils Relative: 1 %
HCT: 29.7 % — ABNORMAL LOW (ref 36.0–46.0)
Hemoglobin: 8.9 g/dL — ABNORMAL LOW (ref 12.0–15.0)
Immature Granulocytes: 0 %
Lymphocytes Relative: 9 %
Lymphs Abs: 0.7 10*3/uL (ref 0.7–4.0)
MCH: 31.7 pg (ref 26.0–34.0)
MCHC: 30 g/dL (ref 30.0–36.0)
MCV: 105.7 fL — ABNORMAL HIGH (ref 80.0–100.0)
Monocytes Absolute: 0.5 10*3/uL (ref 0.1–1.0)
Monocytes Relative: 7 %
Neutro Abs: 6.4 10*3/uL (ref 1.7–7.7)
Neutrophils Relative %: 83 %
Platelets: 207 10*3/uL (ref 150–400)
RBC: 2.81 MIL/uL — ABNORMAL LOW (ref 3.87–5.11)
RDW: 14.3 % (ref 11.5–15.5)
WBC: 7.7 10*3/uL (ref 4.0–10.5)
nRBC: 0 % (ref 0.0–0.2)

## 2022-08-08 LAB — BASIC METABOLIC PANEL
Anion gap: 8 (ref 5–15)
BUN: 54 mg/dL — ABNORMAL HIGH (ref 8–23)
CO2: 21 mmol/L — ABNORMAL LOW (ref 22–32)
Calcium: 8.8 mg/dL — ABNORMAL LOW (ref 8.9–10.3)
Chloride: 112 mmol/L — ABNORMAL HIGH (ref 98–111)
Creatinine, Ser: 4.75 mg/dL — ABNORMAL HIGH (ref 0.44–1.00)
GFR, Estimated: 8 mL/min — ABNORMAL LOW (ref >60)
Glucose, Bld: 86 mg/dL (ref 70–99)
Potassium: 4.8 mmol/L (ref 3.5–5.1)
Sodium: 141 mmol/L (ref 135–145)

## 2022-08-08 LAB — MAGNESIUM: Magnesium: 3.2 mg/dL — ABNORMAL HIGH (ref 1.7–2.4)

## 2022-08-08 LAB — GLUCOSE, CAPILLARY
Glucose-Capillary: 86 mg/dL (ref 70–99)
Glucose-Capillary: 121 mg/dL — ABNORMAL HIGH (ref 70–99)
Glucose-Capillary: 145 mg/dL — ABNORMAL HIGH (ref 70–99)
Glucose-Capillary: 159 mg/dL — ABNORMAL HIGH (ref 70–99)

## 2022-08-08 LAB — T3: T3, Total: 66 ng/dL — ABNORMAL LOW (ref 71–180)

## 2022-08-08 MED ORDER — SALINE SPRAY 0.65 % NA SOLN
1.0000 | NASAL | Status: DC | PRN
  Administered 2022-08-08: 1 via NASAL
  Filled 2022-08-08: qty 44

## 2022-08-08 MED ORDER — BLISTEX MEDICATED EX OINT: TOPICAL_OINTMENT | CUTANEOUS | Status: DC | PRN

## 2022-08-08 MED ORDER — LORATADINE 10 MG PO TABS
10.0000 mg | ORAL_TABLET | Freq: Every day | ORAL | Status: DC
  Administered 2022-08-08 – 2022-08-09 (×2): 10 mg via ORAL
  Filled 2022-08-08: qty 1

## 2022-08-08 MED ORDER — GERHARDT'S BUTT CREAM
TOPICAL_CREAM | Freq: Two times a day (BID) | CUTANEOUS | Status: DC
  Filled 2022-08-08: qty 1

## 2022-08-08 NOTE — Progress Notes (Signed)
Physical Therapy Treatment Patient Details Name: Norma Coleman MRN: [id number] DOB: [birth year] Today's Date: 08/08/2022   History of Present Illness Patient is a 88 year old female who presented with ongoing bloody bowel movements.CT revealed scattered diverticulosis.  PMH: CKD 5 (LUE fistula in place, set to mature ~08/2022), GERD, sciatica, anemia of chronic disease, HTN, DMII, OA, diverticulitis, HLD, mitral regurg, senile osteoporosis    PT Comments    Pt agreeable to working with therapy. Practiced gait training with rollator this session. Daughter was present during session-she is hopeful pt will be agreeable to using the rollator for improved stablity and safety when ambulating.     Recommendations for follow up therapy are one component of a multi-disciplinary discharge planning process, led by the attending physician.  Recommendations may be updated based on patient status, additional functional criteria and insurance authorization.  Follow Up Recommendations  No PT follow up     Assistance Recommended at Discharge PRN  Patient can return home with the following Help with stairs or ramp for entrance;Assistance with cooking/housework;Assist for transportation   Equipment Recommendations  Rollator (4 wheels)    Recommendations for Other Services       Precautions / Restrictions Precautions Precautions: Fall Restrictions Weight Bearing Restrictions: No     Mobility  Bed Mobility Overal bed mobility: Modified Independent                  Transfers Overall transfer level: Needs assistance Equipment used: Rollator (4 wheels) Transfers: Sit to/from Stand Sit to Stand: Supervision           General transfer comment: Supv for proper/safe operation of rollator (brakes, hand placement)    Ambulation/Gait Ambulation/Gait assistance: Supervision Gait Distance (Feet): 250 Feet Assistive device: Rollator (4 wheels) Gait Pattern/deviations: Step-through  pattern, Decreased stride length       General Gait Details: No LOB with RW use. Good steady pace.   Stairs             Wheelchair Mobility    Modified Rankin (Stroke Patients Only)       Balance Overall balance assessment: Mild deficits observed, not formally tested                                          Cognition Arousal/Alertness: Awake/alert Behavior During Therapy: WFL for tasks assessed/performed Overall Cognitive Status: Within Functional Limits for tasks assessed                                          Exercises      General Comments        Pertinent Vitals/Pain Pain Assessment Pain Assessment: No/denies pain    Home Living                          Prior Function            PT Goals (current goals can now be found in the care plan section) Progress towards PT goals: Progressing toward goals    Frequency    Min 3X/week      PT Plan Current plan remains appropriate    Co-evaluation              AM-PAC PT "6 Clicks" Mobility  Outcome Measure  Help needed turning from your back to your side while in a flat bed without using bedrails?: None Help needed moving from lying on your back to sitting on the side of a flat bed without using bedrails?: None Help needed moving to and from a bed to a chair (including a wheelchair)?: None Help needed standing up from a chair using your arms (e.g., wheelchair or bedside chair)?: A Little Help needed to walk in hospital room?: A Little Help needed climbing 3-5 steps with a railing? : A Little 6 Click Score: 21    End of Session   Activity Tolerance: Patient tolerated treatment well Patient left: in bed;with call bell/phone within reach;with family/visitor present   PT Visit Diagnosis: Unsteadiness on feet (R26.81)     Time: 8341-9622 PT Time Calculation (min) (ACUTE ONLY): 15 min  Charges:  $Gait Training: 8-22 mins                          Doreatha Massed, PT Acute Rehabilitation  Office: [phone]

## 2022-08-08 NOTE — Care Management Important Message (Signed)
Important Message  Patient Details IM Letter given to the Patient. Name: Norma Coleman MRN: [id number] Date of Birth: 02/19/1933   Medicare Important Message Given:  Yes     Kerin Salen 08/08/2022, 10:21 AM

## 2022-08-08 NOTE — Progress Notes (Signed)
Progress Note    Norma Coleman   EOF:[id number]  DOB: 10/22/1932  DOA: 08/05/2022     3 PCP: Patient, No Pcp Per  Initial CC: blood stools  Hospital Course: Ms. Solarz is an 88 yo female with PMH CKD 5 (LUE fistula in place, set to mature ~08/2022), GERD, sciatica, anemia of chronic disease, HTN, DMII, OA, diverticulitis, HLD, mitral regurg, senile osteoporosis who presented to the ER with bloody stools. Patient is in town visiting from [place] for the holiday.  Due to ongoing bloody bowel movements, she presented for further evaluation. She could not remember when her last colonoscopy was and denies any significant recurrent history of bloody bowel movements but does endorse a remote history of one that she can remember.  Details unknown regarding etiology or treatment. CT performed on admission showed underlying scattered diverticulosis. Hemoglobin 10.5 g/dL on admission.  GI was also consulted on admission. She also follows outpatient with hematology for EPO injections in setting of her anemia of chronic disease.  Interval History:  Still some pink-tinged stools overnight.  Had good bowel movements after laxative regimen was initiated yesterday and her abdominal pain/cramping has improved as well. Due to further hemoglobin drop and stool color, will monitor 1 more night.  Assessment and Plan: * Lower GI bleed - Patient presented with reports of bloody bowel movements at home prior to admission.  No further bowel movements since admission.  No associated abdominal pain nor any nausea/vomiting.  Patient also afebrile with no leukocytosis - Differential for etiology includes underlying diverticular bleed versus hemorrhoids - GI following, appreciate assistance. No plans for intervention per GI - Hemoglobin 10.5 g/dL on admission with very mild downtrend -Still passing some pink-tinged stools overnight and some hemoglobin drop.  Hopeful for further improvement with monitoring 1 more  night - Hemoglobin 8.9 g/dL this morning - Continue trending hemoglobin and stools  CKD (chronic kidney disease) stage 5, GFR less than 15 ml/min (HCC) - follows with nephrology; has a LUE fistula, set to be matured around Dec 2023 -Still makes adequate urine and was having urinary retention on admission requiring Foley catheter placement - Currently no indication for acute HD - d/c foley today and perform bladder scans  Hypothyroidism - Likely subclinical hypothyroidism, TSH 8.181 - FT4 is normal; TT3 low, 66, but not unexpected given rapid peripheral metabolism  - Continue Synthroid; no need for dose adjustment - repeat TSH in 4-6 weeks  Constipation - Minimal relief from simethicone and Maalox on admission - CT shows underlying significant stool burden -Aggressive laxative regimen on 08/07/2022 was very effective  Paget's disease of bone - Mixed lytic/sclerotic lesions noted in right hemipelvis, sacrum, L5 and 3 vertebral bodies on CT scan - Alk phos 170. Corrected Ca level normal - appears to be asymptomatic; patient already following with heme/onc as well outpatient - hold off on further inpatient workup  Cardiomegaly - Known cardiomegaly.  Last echo performed 04/05/2022 noting moderate concentric LVH.  EF 60 to 65%, mild MVR, mild TVR  Chronic diastolic CHF (congestive heart failure) (HCC) - no s/s exacerbation   DM2 (diabetes mellitus, type 2) ([place]) - continue SSI and CBG monitoring   History of DVT (deep vein thrombosis) - No longer on anticoagulation  Hyperlipidemia - Continue Lipitor  Essential hypertension - Resume amlodipine and Coreg -Okay to resume hydralazine on 08/07/2022  Hypothermia-resolved as of 08/06/2022 - Transient on admission - Low suspicion for thyroid involvement   Old records reviewed in assessment of this patient  Antimicrobials:   DVT prophylaxis:  SCDs Start: 08/06/22 0923   Code Status:   Code Status: Full Code  Mobility  Assessment (last 72 hours)     Mobility Assessment     Row Name 08/08/22 0800 08/07/22 2020 08/07/22 1555 08/07/22 1520 08/07/22 1110   Does patient have an order for bedrest or is patient medically unstable No - Continue assessment No - Continue assessment -- -- No - Continue assessment   What is the highest level of mobility based on the progressive mobility assessment? Level 3 (Stands with assist) - Balance while standing  and cannot march in place Level 3 (Stands with assist) - Balance while standing  and cannot march in place Level 5 (Walks with assist in room/hall) - Balance while stepping forward/back and can walk in room with assist - Complete Level 5 (Walks with assist in room/hall) - Balance while stepping forward/back and can walk in room with assist - Complete Level 5 (Walks with assist in room/hall) - Balance while stepping forward/back and can walk in room with assist - Complete   Is the above level different from baseline mobility prior to current illness? Yes - Recommend PT order -- -- -- --    [place] Name 08/06/22 22:44:01 08/06/22 02:57:41         Does patient have an order for bedrest or is patient medically unstable No - Continue assessment No - Continue assessment      What is the highest level of mobility based on the progressive mobility assessment? Level 6 (Walks independently in room and hall) - Balance while walking in room without assist - Complete Level 6 (Walks independently in room and hall) - Balance while walking in room without assist - Complete               Barriers to discharge:  Disposition Plan:  Home Wed Status is: Inpt  Objective: Blood pressure (!) 128/59, pulse 66, temperature 98 F (36.7 C), temperature source Oral, resp. rate 18, SpO2 (!) 87 %.  Examination:  Physical Exam Constitutional:      Appearance: Normal appearance.  HENT:     Head: Normocephalic.     Mouth/Throat:     Mouth: Mucous membranes are moist.  Eyes:     Extraocular  Movements: Extraocular movements intact.  Cardiovascular:     Rate and Rhythm: Normal rate and regular rhythm.  Pulmonary:     Effort: Pulmonary effort is normal.     Breath sounds: Normal breath sounds.  Abdominal:     General: Bowel sounds are normal.     Palpations: Abdomen is soft.  Musculoskeletal:        General: Normal range of motion.     Cervical back: Normal range of motion and neck supple.  Skin:    General: Skin is warm and dry.  Neurological:     General: No focal deficit present.     Mental Status: She is alert.  Psychiatric:        Mood and Affect: Mood normal.      Consultants:  GI  Procedures:    Data Reviewed: Results for orders placed or performed during the hospital encounter of 08/05/22 (from the past 24 hour(s))  Glucose, capillary     Status: Abnormal   Collection Time: 08/07/22  4:44 PM  Result Value Ref Range   Glucose-Capillary 130 (H) 70 - 99 mg/dL  Glucose, capillary     Status: Abnormal   Collection Time: 08/07/22  8:40  PM  Result Value Ref Range   Glucose-Capillary 117 (H) 70 - 99 mg/dL  Basic metabolic panel     Status: Abnormal   Collection Time: 08/08/22  6:00 AM  Result Value Ref Range   Sodium 141 135 - 145 mmol/L   Potassium 4.8 3.5 - 5.1 mmol/L   Chloride 112 (H) 98 - 111 mmol/L   CO2 21 (L) 22 - 32 mmol/L   Glucose, Bld 86 70 - 99 mg/dL   BUN 54 (H) 8 - 23 mg/dL   Creatinine, Ser 4.75 (H) 0.44 - 1.00 mg/dL   Calcium 8.8 (L) 8.9 - 10.3 mg/dL   GFR, Estimated 8 (L) >60 mL/min   Anion gap 8 5 - 15  CBC with Differential/Platelet     Status: Abnormal   Collection Time: 08/08/22  6:00 AM  Result Value Ref Range   WBC 7.7 4.0 - 10.5 K/uL   RBC 2.81 (L) 3.87 - 5.11 MIL/uL   Hemoglobin 8.9 (L) 12.0 - 15.0 g/dL   HCT 29.7 (L) 36.0 - 46.0 %   MCV 105.7 (H) 80.0 - 100.0 fL   MCH 31.7 26.0 - 34.0 pg   MCHC 30.0 30.0 - 36.0 g/dL   RDW 14.3 11.5 - 15.5 %   Platelets 207 150 - 400 K/uL   nRBC 0.0 0.0 - 0.2 %   Neutrophils Relative  % 83 %   Neutro Abs 6.4 1.7 - 7.7 K/uL   Lymphocytes Relative 9 %   Lymphs Abs 0.7 0.7 - 4.0 K/uL   Monocytes Relative 7 %   Monocytes Absolute 0.5 0.1 - 1.0 K/uL   Eosinophils Relative 1 %   Eosinophils Absolute 0.1 0.0 - 0.5 K/uL   Basophils Relative 0 %   Basophils Absolute 0.0 0.0 - 0.1 K/uL   Immature Granulocytes 0 %   Abs Immature Granulocytes 0.03 0.00 - 0.07 K/uL  Magnesium     Status: Abnormal   Collection Time: 08/08/22  6:00 AM  Result Value Ref Range   Magnesium 3.2 (H) 1.7 - 2.4 mg/dL  Glucose, capillary     Status: None   Collection Time: 08/08/22  7:50 AM  Result Value Ref Range   Glucose-Capillary 86 70 - 99 mg/dL  Glucose, capillary     Status: Abnormal   Collection Time: 08/08/22 10:58 AM  Result Value Ref Range   Glucose-Capillary 121 (H) 70 - 99 mg/dL    I have Reviewed nursing notes, Vitals, and Lab results since pt's last encounter. Pertinent lab results : see above I have ordered test including BMP, CBC, Mg I have reviewed the last note from staff over past 24 hours I have discussed pt's care plan and test results with nursing staff, case manager  Time spent: Greater than 50% of the 55 minute visit was spent in counseling/coordination of care for the patient as laid out in the A&P.    LOS: 3 days   Dwyane Dee, MD [facility] 08/08/2022, 2:42 PM

## 2022-08-09 DIAGNOSIS — K922 Gastrointestinal hemorrhage, unspecified: Secondary | ICD-10-CM | POA: Diagnosis not present

## 2022-08-09 LAB — CBC WITH DIFFERENTIAL/PLATELET
Abs Immature Granulocytes: 0.05 10*3/uL (ref 0.00–0.07)
Basophils Absolute: 0 10*3/uL (ref 0.0–0.1)
Basophils Relative: 0 %
Eosinophils Absolute: 0.1 10*3/uL (ref 0.0–0.5)
Eosinophils Relative: 2 %
HCT: 28.5 % — ABNORMAL LOW (ref 36.0–46.0)
Hemoglobin: 8.5 g/dL — ABNORMAL LOW (ref 12.0–15.0)
Immature Granulocytes: 1 %
Lymphocytes Relative: 8 %
Lymphs Abs: 0.7 10*3/uL (ref 0.7–4.0)
MCH: 31.4 pg (ref 26.0–34.0)
MCHC: 29.8 g/dL — ABNORMAL LOW (ref 30.0–36.0)
MCV: 105.2 fL — ABNORMAL HIGH (ref 80.0–100.0)
Monocytes Absolute: 0.7 10*3/uL (ref 0.1–1.0)
Monocytes Relative: 8 %
Neutro Abs: 6.7 10*3/uL (ref 1.7–7.7)
Neutrophils Relative %: 81 %
Platelets: 201 10*3/uL (ref 150–400)
RBC: 2.71 MIL/uL — ABNORMAL LOW (ref 3.87–5.11)
RDW: 14.5 % (ref 11.5–15.5)
WBC: 8.2 10*3/uL (ref 4.0–10.5)
nRBC: 0 % (ref 0.0–0.2)

## 2022-08-09 LAB — BASIC METABOLIC PANEL
Anion gap: 7 (ref 5–15)
BUN: 57 mg/dL — ABNORMAL HIGH (ref 8–23)
CO2: 23 mmol/L (ref 22–32)
Calcium: 8.7 mg/dL — ABNORMAL LOW (ref 8.9–10.3)
Chloride: 112 mmol/L — ABNORMAL HIGH (ref 98–111)
Creatinine, Ser: 4.99 mg/dL — ABNORMAL HIGH (ref 0.44–1.00)
GFR, Estimated: 8 mL/min — ABNORMAL LOW (ref >60)
Glucose, Bld: 100 mg/dL — ABNORMAL HIGH (ref 70–99)
Potassium: 4.8 mmol/L (ref 3.5–5.1)
Sodium: 142 mmol/L (ref 135–145)

## 2022-08-09 LAB — GLUCOSE, CAPILLARY
Glucose-Capillary: 90 mg/dL (ref 70–99)
Glucose-Capillary: 129 mg/dL — ABNORMAL HIGH (ref 70–99)

## 2022-08-09 LAB — MAGNESIUM: Magnesium: 3.2 mg/dL — ABNORMAL HIGH (ref 1.7–2.4)

## 2022-08-09 NOTE — TOC Progression Note (Signed)
Transition of Care [facility]) - Progression Note    Patient Details  Name: Norma Coleman MRN: [id number] Date of Birth: [birth year]  Transition of Care [facility]) CM/SW Dawson, RN Phone [phone]  08/09/2022, 9:51 AM  Clinical Narrative:    [facility] consulted for DME needs. Patient has therapy recommendation for rollator. Patient is agreeable to receiving rollator. Referral has been called to [facility] with Amber and will be delivered to the room. No other needs noted at this time. TOC will sign off.         Expected Discharge Plan and Services                                                 Social Determinants of Health (SDOH) Interventions    Readmission Risk Interventions     No data to display

## 2022-08-09 NOTE — Discharge Instructions (Signed)
Follow up with PCP in 1 week Follow up with GI as needed Repeat CBC in 1 week to check on hemoglobin Repeat TSH in 4 to 6 weeks Follow up with EPO injections outpatient

## 2022-08-09 NOTE — Progress Notes (Signed)
Pt requested her retacrit/epoetin injection that she receives q2 weeks. Pt is from out of town and does not want to miss this medication. Will pass along to day team to see if we can facilitate. Family at bedside and involved in this conversation.

## 2022-08-09 NOTE — Care Management Obs Status (Signed)
[place] NOTIFICATION   Patient Details  Name: Norma Coleman MRN: [id number] Date of Birth: [birth year]   Medicare Observation Status Notification Given:  Yes    Angelita Ingles, RN 08/09/2022, 10:15 AM

## 2022-08-09 NOTE — Care Management Obs Status (Signed)
[place] NOTIFICATION   Patient Details  Name: Norma Coleman MRN: [id number] Date of Birth: April 22, 1933   Medicare Observation Status Notification Given:  Yes    Angelita Ingles, RN 08/09/2022, 10:15 AM

## 2022-08-09 NOTE — Progress Notes (Signed)
Occupational Therapy Treatment Patient Details Name: Norma Coleman MRN: [id number] DOB: 1933/06/11 Today's Date: 08/09/2022   History of present illness Patient is a 88 year old female who presented with ongoing bloody bowel movements.CT revealed scattered diverticulosis.  PMH: CKD 5 (LUE fistula in place, set to mature ~08/2022), GERD, sciatica, anemia of chronic disease, HTN, DMII, OA, diverticulitis, HLD, mitral regurg, senile osteoporosis   OT comments  The patient presented with good participation in the session. She performed tasks with SBA, including sit to stand using a RW, toileting at bathroom level, and grooming in standing at sink level. She reported feeling weaker than normal, with OT instructing her on general strengthening recommendations, with a emphasis on functional tasks participation and gradually increasing daily activities, upon her return home. She verbalized understanding. She will likely only require 1 additional OT follow up visit during her hospital stay.    Recommendations for follow up therapy are one component of a multi-disciplinary discharge planning process, led by the attending physician.  Recommendations may be updated based on patient status, additional functional criteria and insurance authorization.    Follow Up Recommendations  No OT follow up     Assistance Recommended at Discharge PRN  Patient can return home with the following  Assistance with cooking/housework;Assist for transportation;Help with stairs or ramp for entrance   Equipment Recommendations  None recommended by OT       Precautions / Restrictions Restrictions Weight Bearing Restrictions: No       Mobility Bed Mobility Overal bed mobility: Modified Independent         Transfers Overall transfer level: Needs assistance Equipment used: Rolling walker (2 wheels) Transfers: Sit to/from Stand Sit to Stand: Supervision                     ADL either performed or  assessed with clinical judgement   ADL Overall ADL's : Needs assistance/impaired Eating/Feeding: Independent Eating/Feeding Details (indicate cue type and reason): based on clinical judgement Grooming: Wash/dry hands;Standing;Oral care;Supervision/safety Grooming Details (indicate cue type and reason): She performed tasks in standing at sink level.         Upper Body Dressing : Set up;Sitting Upper Body Dressing Details (indicate cue type and reason): simulated     Toilet Transfer: Supervision/safety;Rolling walker (2 wheels);Ambulation   Toileting- Clothing Manipulation and Hygiene: Supervision/safety;Sit to/from stand Toileting - Clothing Manipulation Details (indicate cue type and reason): Toileting tasks performed at bathroom level.                       Cognition Arousal/Alertness: Awake/alert Behavior During Therapy: WFL for tasks assessed/performed Overall Cognitive Status: Within Functional Limits for tasks assessed                            Pertinent Vitals/ Pain       Pain Assessment Pain Assessment: No/denies pain         Frequency  Min 2X/week        Progress Toward Goals  OT Goals(current goals can now be found in the care plan section)  Progress towards OT goals: Progressing toward goals  Acute Rehab OT Goals Patient Stated Goal: to return to her home in [place] OT Goal Formulation: With patient Time For Goal Achievement: 08/21/22 Potential to Achieve Goals: Good  Plan Discharge plan remains appropriate       AM-PAC OT "6 Clicks" Daily Activity     Outcome  Measure   Help from another person eating meals?: None Help from another person taking care of personal grooming?: None Help from another person toileting, which includes using toliet, bedpan, or urinal?: A Lot Help from another person bathing (including washing, rinsing, drying)?: A Little Help from another person to put on and taking off regular upper body clothing?:  None Help from another person to put on and taking off regular lower body clothing?: A Little 6 Click Score: 20    End of Session Equipment Utilized During Treatment: Rolling walker (2 wheels)  OT Visit Diagnosis: Unsteadiness on feet (R26.81)   Activity Tolerance Patient tolerated treatment well   Patient Left in bed;with call bell/phone within reach;with family/visitor present   Nurse Communication Mobility status;Other (comment)        Time: 5400-8676 OT Time Calculation (min): 10 min  Charges: OT General Charges $OT Visit: 1 Visit OT Treatments $Self Care/Home Management : 8-22 mins     Leota Sauers, OTR/L 08/09/2022, 11:52 AM

## 2022-08-09 NOTE — Discharge Summary (Signed)
Physician Discharge Summary  Nolyn Eilert IHK:[id number] DOB: May 08, 1933 DOA: 08/05/2022  PCP: Patient, No Pcp Per  Admit date: 08/05/2022 Discharge date: 08/09/2022  Admitted From: Home Disposition:  Home   Recommendations for Outpatient Follow-up:  Follow up with PCP in 1 week Follow up with GI as needed Repeat CBC in 1 week to check on hemoglobin Repeat TSH in 4 to 6 weeks Follow up with EPO injections outpatient  Discharge Condition: Stable CODE STATUS: Full code Diet recommendation:  Diet Orders (From admission, onward)     Start     Ordered   08/09/22 0000  Diet - low sodium heart healthy        08/09/22 1023   08/06/22 0924  Diet Heart Room service appropriate? Yes; Fluid consistency: Thin  Diet effective now       Question Answer Comment  Room service appropriate? Yes   Fluid consistency: Thin      08/06/22 7564             Brief/Interim Summary: Norma Coleman is an 88 yo female with PMH CKD 5 (LUE fistula in place, set to mature ~08/2022), GERD, sciatica, anemia of chronic disease, HTN, DMII, OA, diverticulitis, HLD, mitral regurg, senile osteoporosis who presented to the ER with bloody stools. Patient is in town visiting from [place] for the holiday.  Due to ongoing bloody bowel movements, she presented for further evaluation. She could not remember when her last colonoscopy was and denies any significant recurrent history of bloody bowel movements but does endorse a remote history of one that she can remember.  Details unknown regarding etiology or treatment. CT performed on admission showed underlying scattered diverticulosis. Hemoglobin 10.5 g/dL on admission.  GI was also consulted on admission. She also follows outpatient with hematology for EPO injections in setting of her anemia of chronic disease. Patient was observed in the hospital.  Differential etiology of GI bleeding was diverticular bleed versus hemorrhoids.  GI did not recommend any further  interventions.  Bowel movements improved and hemoglobin stabilized.  Patient did not require blood transfusions.  Discharge Diagnoses:   Principal Problem:   Lower GI bleed Active Problems:   CKD (chronic kidney disease) stage 5, GFR less than 15 ml/min (HCC)   Hypothyroidism   Paget's disease of bone   Constipation   Essential hypertension   Hyperlipidemia   History of DVT (deep vein thrombosis)   DM2 (diabetes mellitus, type 2) (HCC)   Chronic diastolic CHF (congestive heart failure) (HCC)   Cardiomegaly   Acute renal failure superimposed on stage 5 chronic kidney disease, not on chronic dialysis [facility])    Discharge Instructions  Discharge Instructions     Call MD for:  difficulty breathing, headache or visual disturbances   Complete by: As directed    Call MD for:  extreme fatigue   Complete by: As directed    Call MD for:  persistant dizziness or light-headedness   Complete by: As directed    Call MD for:  persistant nausea and vomiting   Complete by: As directed    Call MD for:  severe uncontrolled pain   Complete by: As directed    Call MD for:  temperature >100.4   Complete by: As directed    Diet - low sodium heart healthy   Complete by: As directed    Discharge instructions   Complete by: As directed    .[email or web address]   Increase activity slowly   Complete by: As directed  Allergies as of 08/09/2022       Reactions   Ibuprofen    Sulfa Antibiotics         Medication List     TAKE these medications    ALPRAZolam 1 MG tablet Commonly known as: XANAX Take 1 mg by mouth at bedtime as needed for anxiety.   amLODipine 10 MG tablet Commonly known as: NORVASC Take 10 mg by mouth daily.   ascorbic acid 500 MG tablet Commonly known as: VITAMIN C Take 500 mg by mouth daily.   atorvastatin 40 MG tablet Commonly known as: LIPITOR Take 40 mg by mouth daily.   Biotin 5 MG Caps Take 5 mg by mouth daily.   carvedilol 3.125 MG tablet Commonly known  as: COREG Take 1 tablet by mouth 2 (two) times daily with a meal.   cholecalciferol 25 MCG (1000 UNIT) tablet Commonly known as: VITAMIN D3 Take 1,000 Units by mouth daily.   furosemide 40 MG tablet Commonly known as: LASIX Take 40 mg by mouth.   hydrALAZINE 50 MG tablet Commonly known as: APRESOLINE Take 50 mg by mouth 3 (three) times daily.   levothyroxine 75 MCG tablet Commonly known as: SYNTHROID Take 75 mcg by mouth daily before breakfast.   multivitamin with minerals tablet Take 1 tablet by mouth daily.   sodium bicarbonate 650 MG tablet Take 650 mg by mouth 2 (two) times daily.   vitamin B-12 100 MCG tablet Commonly known as: CYANOCOBALAMIN Take 100 mcg by mouth daily.               Durable Medical Equipment  (From admission, onward)           Start     Ordered   08/09/22 0951  For home use only DME 4 wheeled rolling walker with seat  Once       Question:  Patient needs a walker to treat with the following condition  Answer:  Weakness   08/09/22 0950            Allergies  Allergen Reactions   Ibuprofen    Sulfa Antibiotics     Consultations: GI    Procedures/Studies: CT RENAL STONE STUDY  Result Date: 08/05/2022 CLINICAL DATA:  Abdominal/flank pain, stone suspected.  Hematuria. EXAM: CT ABDOMEN AND PELVIS WITHOUT CONTRAST TECHNIQUE: Multidetector CT imaging of the abdomen and pelvis was performed following the standard protocol without IV contrast. RADIATION DOSE REDUCTION: This exam was performed according to the departmental dose-optimization program which includes automated exposure control, adjustment of the mA and/or kV according to patient size and/or use of iterative reconstruction technique. COMPARISON:  None Available. FINDINGS: Lower chest: Mild left basilar atelectasis. No acute pulmonary process. Hepatobiliary: No focal liver abnormality is seen. Multiple dependent gallstones without gallbladder wall thickening, or biliary  dilatation. Pancreas: Unremarkable. No pancreatic ductal dilatation or surrounding inflammatory changes. Spleen: Normal in size without focal abnormality. Adrenals/Urinary Tract: Adrenal glands are unremarkable. Bilateral exophytic renal cyst, left upper pole cyst measuring 2 cm and right interpolar renal cyst measuring 1.4 cm measuring complex fluid in density, likely representing a proteinaceous/hemorrhagic cysts. No evidence of nephrolithiasis or hydronephrosis. Bladder is unremarkable. Stomach/Bowel: Stomach is within normal limits. Appendix appears normal. Scattered colonic diverticulosis prominent in the sigmoid colon without evidence of acute diverticulitis. No evidence of bowel wall thickening, distention, or inflammatory changes. Vascular/Lymphatic: Aortic atherosclerosis. No enlarged abdominal or pelvic lymph nodes. Reproductive: Uterus and bilateral adnexa are unremarkable. Other: No abdominal wall hernia or abnormality. No  abdominopelvic ascites. Musculoskeletal: There is osseous enlargement with coarse trabeculation mixed lytic/sclerotic lesions of the right hemipelvis, sacrum, L5 and 3 vertebral bodies. These may represent Paget's disease. Correlate with prior clinical history. No acute fracture. IMPRESSION: 1. No evidence of nephrolithiasis or hydronephrosis. 2. Bilateral renal cysts, which appear complex, likely representing proteinaceous/hemorrhagic cysts. 3. Cholelithiasis without evidence of acute cholecystitis. 4. Scattered colonic diverticulosis without evidence of acute diverticulitis. 5. Normal appendix. No evidence of colitis or diverticulitis. 6. Osseous enlargement with coarse trabeculation, mixed lytic/sclerotic lesions of the right hemipelvis, sacrum, L5 and 3 vertebral bodies. 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 Correlate with prior clinical history. 7. Aortic atherosclerosis. Aortic Atherosclerosis (ICD10-I70.0). Electronically Signed   By: Keane Police D.O.   On: 08/05/2022 21:44    DG Chest Port 1 View  Result Date: 08/05/2022 CLINICAL DATA:  Shortness of breath. EXAM: PORTABLE CHEST 1 VIEW COMPARISON:  None Available. FINDINGS: The cardiac silhouette is moderately enlarged. There is moderate severity calcification of the aortic arch. The lungs are hyperinflated. Biapical pleural thickening is seen. There is no evidence of an acute infiltrate, pleural effusion or pneumothorax. The visualized skeletal structures are unremarkable. IMPRESSION: Cardiomegaly without evidence of acute or active cardiopulmonary disease. Electronically Signed   By: Virgina Norfolk M.D.   On: 08/05/2022 20:09       Discharge Exam: Vitals:   08/09/22 0455 08/09/22 0855  BP:  (!) 144/60  Pulse: 64 64  Resp: 18   Temp:    SpO2: 94%     General: Pt is alert, awake, not in acute distress Cardiovascular: RRR, S1/S2 +, no edema Respiratory: CTA bilaterally, no wheezing, no rhonchi, no respiratory distress, no conversational dyspnea  Abdominal: Soft, NT, ND, bowel sounds + Extremities: no edema, no cyanosis Psych: Normal mood and affect, stable judgement and insight     The results of significant diagnostics from this hospitalization (including imaging, microbiology, ancillary and laboratory) are listed below for reference.     Microbiology: Recent Results (from the past 240 hour(s))  SARS Coronavirus 2 by RT PCR (hospital order, performed in [facility] hospital lab) *cepheid single result test* Anterior Nasal Swab     Status: None   Collection Time: 08/05/22  6:17 PM   Specimen: Anterior Nasal Swab  Result Value Ref Range Status   SARS Coronavirus 2 by RT PCR NEGATIVE NEGATIVE Final    Comment: (NOTE) SARS-CoV-2 target nucleic acids are NOT DETECTED.  The SARS-CoV-2 RNA is generally detectable in upper and lower respiratory specimens during the acute phase of infection. The lowest concentration of SARS-CoV-2 viral copies this assay can detect is 250 copies / mL. A negative  result does not preclude SARS-CoV-2 infection and should not be used as the sole basis for treatment or other patient management decisions.  A negative result may occur with improper specimen collection / handling, submission of specimen other than nasopharyngeal swab, presence of viral mutation(s) within the areas targeted by this assay, and inadequate number of viral copies (<250 copies / mL). A negative result must be combined with clinical observations, patient history, and epidemiological information.  Fact Sheet for Patients:   https://www.patel.info/  Fact Sheet for Healthcare Providers: https://hall.com/  This test is not yet approved or  cleared by the [place] FDA and has been authorized for detection and/or diagnosis of SARS-CoV-2 by FDA under an Emergency Use Authorization (EUA).  This EUA will remain in effect (meaning this test can be used) for the duration of the COVID-19 declaration  under Section 564(b)(1) of the Act, 21 U.S.C. section 360bbb-3(b)(1), unless the authorization is terminated or revoked sooner.  Performed at [facility], [place] [address] [place], [place] [address]   Culture, blood (Routine X 2) w Reflex to ID Panel     Status: None (Preliminary result)   Collection Time: 08/05/22 10:15 PM   Specimen: BLOOD  Result Value Ref Range Status   Specimen Description   Final    BLOOD SITE NOT SPECIFIED Performed at [facility] [address] [place], [place] [address]    Special Requests   Final    BOTTLES DRAWN AEROBIC AND ANAEROBIC Blood Culture adequate volume Performed at [facility] [address] [place], [place] [address]    Culture   Final    NO GROWTH 3 DAYS Performed at [facility], [place] [address] [place], [place] [address]    Report Status PENDING  Incomplete  Culture, blood (Routine X 2) w Reflex to ID Panel     Status: None  (Preliminary result)   Collection Time: 08/05/22 10:30 PM   Specimen: BLOOD  Result Value Ref Range Status   Specimen Description   Final    BLOOD SITE NOT SPECIFIED Performed at [facility] [address], [place], [place] [address]    Special Requests   Final    BOTTLES DRAWN AEROBIC AND ANAEROBIC Blood Culture adequate volume Performed at [facility] [address], [place], [place] [address]    Culture   Final    NO GROWTH 3 DAYS Performed at [facility], [place] [address], [place], [place] [address]    Report Status PENDING  Incomplete     Labs: BNP (last 3 results) No results for input(s): "BNP" in the last 8760 hours. Basic Metabolic Panel: Recent Labs  Lab 08/05/22 1411 08/05/22 1945 08/06/22 0958 08/07/22 0547 08/08/22 0600 08/09/22 0620  NA 138  --  143 142 141 142  K 5.1  --  4.9 4.7 4.8 4.8  CL 109  --  111 113* 112* 112*  CO2 19*  --  19* 18* 21* 23  GLUCOSE 105*  --  81 101* 86 100*  BUN 50*  --  50* 49* 54* 57*  CREATININE 4.47*  --  4.51* 3.73* 4.75* 4.99*  CALCIUM 8.9  --  9.2 9.2 8.8* 8.7*  MG  --  2.6* 2.4 2.5* 3.2* 3.2*  PHOS  --  4.6 4.6  --   --   --    Liver Function Tests: Recent Labs  Lab 08/05/22 1411 08/06/22 0958  AST 24  --   ALT 18  --   ALKPHOS 170*  --   BILITOT 0.7  --   PROT 7.0  --   ALBUMIN 3.6 3.3*   No results for input(s): "LIPASE", "AMYLASE" in the last 168 hours. No results for input(s): "AMMONIA" in the last 168 hours. CBC: Recent Labs  Lab 08/05/22 1411 08/05/22 1945 08/06/22 0235 08/06/22 0745 08/06/22 1410 08/07/22 0547 08/08/22 0600 08/09/22 0620  WBC 5.5   < > 6.5 5.5  --  7.8 7.7 8.2  NEUTROABS 4.1  --   --   --   --  6.1 6.4 6.7  HGB 10.5*   < > 9.9* 9.8* 9.9* 9.8* 8.9* 8.5*  HCT 34.4*   < > 31.2* 32.2* 31.8* 32.0* 29.7* 28.5*  MCV 103.3*   < > 103.0* 103.9*  --  103.6* 105.7* 105.2*  PLT 251   < > 230 237  --  229 207 201   < > = values in this  interval not displayed.   Cardiac Enzymes: Recent Labs  Lab 08/05/22 1945  CKTOTAL 33*   BNP: Invalid input(s): "POCBNP" CBG: Recent Labs  Lab 08/08/22 0750 08/08/22 1058 08/08/22 1626 08/08/22 2115 08/09/22 0746  GLUCAP 86 121* 145* 159* 90   D-Dimer No results for input(s): "DDIMER" in the last 72 hours. Hgb A1c No results for input(s): "HGBA1C" in the last 72 hours. Lipid Profile No results for input(s): "CHOL", "HDL", "LDLCALC", "TRIG", "CHOLHDL", "LDLDIRECT" in the last 72 hours. Thyroid function studies No results for input(s): "TSH", "T4TOTAL", "T3FREE", "THYROIDAB" in the last 72 hours.  Invalid input(s): "FREET3" Anemia work up No results for input(s): "VITAMINB12", "FOLATE", "FERRITIN", "TIBC", "IRON", "RETICCTPCT" in the last 72 hours. Urinalysis    Component Value Date/Time   COLORURINE STRAW (A) 08/05/2022 2200   APPEARANCEUR CLEAR 08/05/2022 2200   LABSPEC 1.006 08/05/2022 2200   PHURINE 6.0 08/05/2022 2200   GLUCOSEU NEGATIVE 08/05/2022 2200   HGBUR NEGATIVE 08/05/2022 2200   BILIRUBINUR NEGATIVE 08/05/2022 2200   KETONESUR NEGATIVE 08/05/2022 2200   PROTEINUR 100 (A) 08/05/2022 2200   NITRITE NEGATIVE 08/05/2022 2200   LEUKOCYTESUR NEGATIVE 08/05/2022 2200   Sepsis Labs Recent Labs  Lab 08/06/22 0745 08/07/22 0547 08/08/22 0600 08/09/22 0620  WBC 5.5 7.8 7.7 8.2   Microbiology Recent Results (from the past 240 hour(s))  SARS Coronavirus 2 by RT PCR (hospital order, performed in [facility] hospital lab) *cepheid single result test* Anterior Nasal Swab     Status: None   Collection Time: 08/05/22  6:17 PM   Specimen: Anterior Nasal Swab  Result Value Ref Range Status   SARS Coronavirus 2 by RT PCR NEGATIVE NEGATIVE Final    Comment: (NOTE) SARS-CoV-2 target nucleic acids are NOT DETECTED.  The SARS-CoV-2 RNA is generally detectable in upper and lower respiratory specimens during the acute phase of infection. The lowest concentration  of SARS-CoV-2 viral copies this assay can detect is 250 copies / mL. A negative result does not preclude SARS-CoV-2 infection and should not be used as the sole basis for treatment or other patient management decisions.  A negative result may occur with improper specimen collection / handling, submission of specimen other than nasopharyngeal swab, presence of viral mutation(s) within the areas targeted by this assay, and inadequate number of viral copies (<250 copies / mL). A negative result must be combined with clinical observations, patient history, and epidemiological information.  Fact Sheet for Patients:   https://www.patel.info/  Fact Sheet for Healthcare Providers: https://hall.com/  This test is not yet approved or  cleared by the [place] FDA and has been authorized for detection and/or diagnosis of SARS-CoV-2 by FDA under an Emergency Use Authorization (EUA).  This EUA will remain in effect (meaning this test can be used) for the duration of the COVID-19 declaration under Section 564(b)(1) of the Act, 21 U.S.C. section 360bbb-3(b)(1), unless the authorization is terminated or revoked sooner.  Performed at [facility], [place] [address] [place], [place] [address]   Culture, blood (Routine X 2) w Reflex to ID Panel     Status: None (Preliminary result)   Collection Time: 08/05/22 10:15 PM   Specimen: BLOOD  Result Value Ref Range Status   Specimen Description   Final    BLOOD SITE NOT SPECIFIED Performed at [facility],  [place] [address], [place], [place] [address]    Special Requests   Final    BOTTLES DRAWN AEROBIC AND ANAEROBIC Blood Culture adequate volume Performed at [facility] [address] [place], [place] [address]    Culture   Final    NO GROWTH 3 DAYS Performed at [facility], [place] [address], [place], [place] [address]    Report Status PENDING   Incomplete  Culture, blood (Routine X 2) w Reflex to ID Panel     Status: None (Preliminary result)   Collection Time: 08/05/22 10:30 PM   Specimen: BLOOD  Result Value Ref Range Status   Specimen Description   Final    BLOOD SITE NOT SPECIFIED Performed at [facility] [address], [place], [place] [address]    Special Requests   Final    BOTTLES DRAWN AEROBIC AND ANAEROBIC Blood Culture adequate volume Performed at [facility] [address], [place], [place] [address]    Culture   Final    NO GROWTH 3 DAYS Performed at [facility], [place] [address] [place],  [address]    Report Status PENDING  Incomplete     Patient was seen and examined on the day of discharge and was found to be in stable condition. Time coordinating discharge: 35 minutes including assessment and coordination of care, as well as examination of the patient.   SIGNED:  Dessa Phi, DO [facility] 08/09/2022, 10:23 AM

## 2022-08-11 ENCOUNTER — Telehealth: Payer: Self-pay | Admitting: Physician Assistant

## 2022-08-11 LAB — CULTURE, BLOOD (ROUTINE X 2)
Culture: NO GROWTH
Culture: NO GROWTH
Special Requests: ADEQUATE
Special Requests: ADEQUATE

## 2022-08-11 NOTE — Telephone Encounter (Signed)
Spoke with patient scheduling new patient appointment 12/26

## 2022-09-04 NOTE — Progress Notes (Deleted)
[facility] [phone]   [phone]  INITIAL CONSULT NOTE  Patient Care Team: Patient, No Pcp Per as PCP - General (General Practice)  Hematological/Oncological History # ***  08/05/22: No evidence of iron, vitamin B12 or folate deficiences.   CHIEF COMPLAINTS/PURPOSE OF CONSULTATION:  Normocytic anemia  HISTORY OF PRESENTING ILLNESS:  Norma Coleman 88 y.o. 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 And receiving retacrit, MGUS. (Formerly under the care of hematologist in [place], [place]).   On review of the previous records ***  On exam today ***  MEDICAL HISTORY:  Past Medical History:  Diagnosis Date   DVT (deep venous thrombosis) (HCC)    GI bleed due to NSAIDs    High cholesterol    Hypertension    Skin cancer    Thyroid disease     SURGICAL HISTORY: Past Surgical History:  Procedure Laterality Date   KNEE SURGERY     TONSILLECTOMY     TUBAL LIGATION      SOCIAL HISTORY: Social History   Socioeconomic History   Marital status: Legally Separated    Spouse name: Not on file   Number of children: Not on file   Years of education: Not on file   Highest education level: Not on file  Occupational History   Not on file  Tobacco Use   Smoking status: Never   Smokeless tobacco: Never  Substance and Sexual Activity   Alcohol use: Never   Drug use: Never   Sexual activity: Not on file  Other Topics Concern   Not on file  Social History Narrative   Not on file   Social Determinants of Health   Financial Resource Strain: Not on file  Food Insecurity: No Food Insecurity (08/08/2022)   Hunger Vital Sign    Worried About Running Out of Food in the Last Year: Never true    Ran Out of Food in the Last Year: Never true  Transportation Needs: No Transportation Needs (08/08/2022)   PRAPARE - Hydrologist (Medical): No    Lack of Transportation  (Non-Medical): No  Physical Activity: Not on file  Stress: Not on file  Social Connections: Not on file  Intimate Partner Violence: Not At Risk (08/08/2022)   Humiliation, Afraid, Rape, and Kick questionnaire    Fear of Current or Ex-Partner: No    Emotionally Abused: No    Physically Abused: No    Sexually Abused: No    FAMILY HISTORY: No family history on file.  ALLERGIES:  is allergic to ibuprofen and sulfa antibiotics.  MEDICATIONS:  Current Outpatient Medications  Medication Sig Dispense Refill   ALPRAZolam (XANAX) 1 MG tablet Take 1 mg by mouth at bedtime as needed for anxiety.     amLODipine (NORVASC) 10 MG tablet Take 10 mg by mouth daily.     ascorbic acid (VITAMIN C) 500 MG tablet Take 500 mg by mouth daily.     atorvastatin (LIPITOR) 40 MG tablet Take 40 mg by mouth daily.     Biotin 5 MG CAPS Take 5 mg by mouth daily.     carvedilol (COREG) 3.125 MG tablet Take 1 tablet by mouth 2 (two) times daily with a meal.     cholecalciferol (VITAMIN D3) 25 MCG (1000 UNIT) tablet Take 1,000 Units by mouth daily.     furosemide (LASIX) 40 MG tablet Take 40 mg by mouth.     hydrALAZINE (APRESOLINE)  50 MG tablet Take 50 mg by mouth 3 (three) times daily.     levothyroxine (SYNTHROID) 75 MCG tablet Take 75 mcg by mouth daily before breakfast.     Multiple Vitamins-Minerals (MULTIVITAMIN WITH MINERALS) tablet Take 1 tablet by mouth daily.     sodium bicarbonate 650 MG tablet Take 650 mg by mouth 2 (two) times daily.     vitamin B-12 (CYANOCOBALAMIN) 100 MCG tablet Take 100 mcg by mouth daily.     No current facility-administered medications for this visit.    REVIEW OF SYSTEMS:   Constitutional: ( - ) fevers, ( - )  chills , ( - ) night sweats Eyes: ( - ) blurriness of vision, ( - ) double vision, ( - ) watery eyes Ears, nose, mouth, throat, and face: ( - ) mucositis, ( - ) sore throat Respiratory: ( - ) cough, ( - ) dyspnea, ( - ) wheezes Cardiovascular: ( - ) palpitation, ( -  ) chest discomfort, ( - ) lower extremity swelling Gastrointestinal:  ( - ) nausea, ( - ) heartburn, ( - ) change in bowel habits Skin: ( - ) abnormal skin rashes Lymphatics: ( - ) new lymphadenopathy, ( - ) easy bruising Neurological: ( - ) numbness, ( - ) tingling, ( - ) new weaknesses Behavioral/Psych: ( - ) mood change, ( - ) new changes  All other systems were reviewed with the patient and are negative.  PHYSICAL EXAMINATION: ECOG PERFORMANCE STATUS: {CHL ONC ECOG [phone]}  There were no vitals filed for this visit. There were no vitals filed for this visit.  GENERAL: well appearing *** in NAD  SKIN: skin color, texture, turgor are normal, no rashes or significant lesions EYES: conjunctiva are pink and non-injected, sclera clear OROPHARYNX: no exudate, no erythema; lips, buccal mucosa, and tongue normal  NECK: supple, non-tender LYMPH:  no palpable lymphadenopathy in the cervical, axillary or supraclavicular lymph nodes.  LUNGS: clear to auscultation and percussion with normal breathing effort HEART: regular rate & rhythm and no murmurs and no lower extremity edema ABDOMEN: soft, non-tender, non-distended, normal bowel sounds Musculoskeletal: no cyanosis of digits and no clubbing  PSYCH: alert & oriented x 3, fluent speech NEURO: no focal motor/sensory deficits  LABORATORY DATA:  I have reviewed the data as listed    Latest Ref Rng & Units 08/09/2022    6:20 AM 08/08/2022    6:00 AM 08/07/2022    5:47 AM  CBC  WBC 4.0 - 10.5 K/uL 8.2  7.7  7.8   Hemoglobin 12.0 - 15.0 g/dL 8.5  8.9  9.8   Hematocrit 36.0 - 46.0 % 28.5  29.7  32.0   Platelets 150 - 400 K/uL 201  207  229        Latest Ref Rng & Units 08/09/2022    6:20 AM 08/08/2022    6:00 AM 08/07/2022    5:47 AM  CMP  Glucose 70 - 99 mg/dL 100  86  101   BUN 8 - 23 mg/dL 57  54  49   Creatinine 0.44 - 1.00 mg/dL 4.99  4.75  3.73   Sodium 135 - 145 mmol/L 142  141  142   Potassium 3.5 - 5.1 mmol/L 4.8   4.8  4.7   Chloride 98 - 111 mmol/L 112  112  113   CO2 22 - 32 mmol/L '23  21  18   '$ Calcium 8.9 - 10.3 mg/dL 8.7  8.8  9.2  PATHOLOGY: ***  BLOOD FILM: *** Review of the peripheral blood smear showed normal appearing white cells with neutrophils that were appropriately lobated and granulated. There was no predominance of bi-lobed or hyper-segmented neutrophils appreciated. No Dohle bodies were noted. There was no left shifting, immature forms or blasts noted. Lymphocytes remain normal in size without any predominance of large granular lymphocytes. Red cells show no anisopoikilocytosis, macrocytes , microcytes or polychromasia. There were no schistocytes, target cells, echinocytes, acanthocytes, dacrocytes, or stomatocytes.There was no rouleaux formation, nucleated red cells, or intra-cellular inclusions noted. The platelets are normal in size, shape, and color without any clumping evident.  RADIOGRAPHIC STUDIES: I have personally reviewed the radiological images as listed and agreed with the findings in the report. No results found.  ASSESSMENT & PLAN ***  No orders of the defined types were placed in this encounter.   All questions were answered. The patient knows to call the clinic with any problems, questions or concerns.  I have spent a total of {CHL ONC TIME VISIT - IRCVE:[id number]} minutes of face-to-face and non-face-to-face time, preparing to see the patient, obtaining and/or reviewing separately obtained history, performing a medically appropriate examination, counseling and educating the patient, ordering medications/tests/procedures, referring and communicating with other health care professionals, documenting clinical information in the electronic health record, independently interpreting results and communicating results to the patient, and care coordination.   Dede Query, PA-C Department of Hematology/Oncology [facility] Phone:  [phone]

## 2022-09-05 ENCOUNTER — Inpatient Hospital Stay: Payer: Medicare PPO | Attending: Physician Assistant | Admitting: Physician Assistant

## 2022-09-12 ENCOUNTER — Inpatient Hospital Stay (HOSPITAL_COMMUNITY): Admission: RE | Admit: 2022-09-12 | Payer: Medicare PPO | Source: Ambulatory Visit

## 2022-09-12 ENCOUNTER — Other Ambulatory Visit: Payer: Self-pay

## 2022-09-12 ENCOUNTER — Emergency Department (HOSPITAL_COMMUNITY): Payer: Medicare PPO

## 2022-09-12 ENCOUNTER — Inpatient Hospital Stay (HOSPITAL_COMMUNITY)
Admission: EM | Admit: 2022-09-12 | Discharge: 2022-09-20 | DRG: 193 | Disposition: A | Discharge status: Home / Self Care | Payer: Medicare PPO | Attending: Internal Medicine | Admitting: Internal Medicine

## 2022-09-12 ENCOUNTER — Encounter (HOSPITAL_COMMUNITY): Payer: Self-pay | Admitting: Emergency Medicine

## 2022-09-12 DIAGNOSIS — Z9851 Tubal ligation status: Secondary | ICD-10-CM

## 2022-09-12 DIAGNOSIS — Z86718 Personal history of other venous thrombosis and embolism: Secondary | ICD-10-CM

## 2022-09-12 DIAGNOSIS — Z85828 Personal history of other malignant neoplasm of skin: Secondary | ICD-10-CM

## 2022-09-12 DIAGNOSIS — E1122 Type 2 diabetes mellitus with diabetic chronic kidney disease: Secondary | ICD-10-CM | POA: Diagnosis present

## 2022-09-12 DIAGNOSIS — Z882 Allergy status to sulfonamides status: Secondary | ICD-10-CM

## 2022-09-12 DIAGNOSIS — Z1152 Encounter for screening for COVID-19: Secondary | ICD-10-CM

## 2022-09-12 DIAGNOSIS — Z992 Dependence on renal dialysis: Secondary | ICD-10-CM

## 2022-09-12 DIAGNOSIS — I132 Hypertensive heart and chronic kidney disease with heart failure and with stage 5 chronic kidney disease, or end stage renal disease: Secondary | ICD-10-CM | POA: Diagnosis present

## 2022-09-12 DIAGNOSIS — Z79890 Hormone replacement therapy (postmenopausal): Secondary | ICD-10-CM

## 2022-09-12 DIAGNOSIS — D631 Anemia in chronic kidney disease: Secondary | ICD-10-CM | POA: Diagnosis present

## 2022-09-12 DIAGNOSIS — Z79899 Other long term (current) drug therapy: Secondary | ICD-10-CM

## 2022-09-12 DIAGNOSIS — E440 Moderate protein-calorie malnutrition: Secondary | ICD-10-CM | POA: Insufficient documentation

## 2022-09-12 DIAGNOSIS — R531 Weakness: Secondary | ICD-10-CM

## 2022-09-12 DIAGNOSIS — N186 End stage renal disease: Secondary | ICD-10-CM

## 2022-09-12 DIAGNOSIS — E7800 Pure hypercholesterolemia, unspecified: Secondary | ICD-10-CM | POA: Diagnosis present

## 2022-09-12 DIAGNOSIS — N189 Chronic kidney disease, unspecified: Secondary | ICD-10-CM

## 2022-09-12 DIAGNOSIS — Z6826 Body mass index (BMI) 26.0-26.9, adult: Secondary | ICD-10-CM

## 2022-09-12 DIAGNOSIS — J1001 Influenza due to other identified influenza virus with the same other identified influenza virus pneumonia: Principal | ICD-10-CM | POA: Diagnosis present

## 2022-09-12 DIAGNOSIS — I5032 Chronic diastolic (congestive) heart failure: Secondary | ICD-10-CM | POA: Diagnosis present

## 2022-09-12 DIAGNOSIS — J9601 Acute respiratory failure with hypoxia: Secondary | ICD-10-CM | POA: Diagnosis present

## 2022-09-12 DIAGNOSIS — E039 Hypothyroidism, unspecified: Secondary | ICD-10-CM | POA: Diagnosis present

## 2022-09-12 DIAGNOSIS — Z888 Allergy status to other drugs, medicaments and biological substances status: Secondary | ICD-10-CM

## 2022-09-12 DIAGNOSIS — J101 Influenza due to other identified influenza virus with other respiratory manifestations: Secondary | ICD-10-CM | POA: Diagnosis present

## 2022-09-12 DIAGNOSIS — J111 Influenza due to unidentified influenza virus with other respiratory manifestations: Secondary | ICD-10-CM

## 2022-09-12 DIAGNOSIS — M898X9 Other specified disorders of bone, unspecified site: Secondary | ICD-10-CM | POA: Diagnosis present

## 2022-09-12 HISTORY — DX: Pneumonia, unspecified organism: J18.9

## 2022-09-12 HISTORY — DX: Thyrotoxicosis, unspecified without thyrotoxic crisis or storm: E05.90

## 2022-09-12 LAB — BASIC METABOLIC PANEL
Anion gap: 10 (ref 5–15)
BUN: 51 mg/dL — ABNORMAL HIGH (ref 8–23)
CO2: 16 mmol/L — ABNORMAL LOW (ref 22–32)
Calcium: 9 mg/dL (ref 8.9–10.3)
Chloride: 109 mmol/L (ref 98–111)
Creatinine, Ser: 4.87 mg/dL — ABNORMAL HIGH (ref 0.44–1.00)
GFR, Estimated: 8 mL/min — ABNORMAL LOW (ref >60)
Glucose, Bld: 105 mg/dL — ABNORMAL HIGH (ref 70–99)
Potassium: 4.9 mmol/L (ref 3.5–5.1)
Sodium: 135 mmol/L (ref 135–145)

## 2022-09-12 LAB — CBC WITH DIFFERENTIAL/PLATELET
Abs Immature Granulocytes: 0.02 10*3/uL (ref 0.00–0.07)
Basophils Absolute: 0 10*3/uL (ref 0.0–0.1)
Basophils Relative: 0 %
Eosinophils Absolute: 0 10*3/uL (ref 0.0–0.5)
Eosinophils Relative: 0 %
HCT: 30.7 % — ABNORMAL LOW (ref 36.0–46.0)
Hemoglobin: 9.4 g/dL — ABNORMAL LOW (ref 12.0–15.0)
Immature Granulocytes: 1 %
Lymphocytes Relative: 10 %
Lymphs Abs: 0.4 10*3/uL — ABNORMAL LOW (ref 0.7–4.0)
MCH: 31.1 pg (ref 26.0–34.0)
MCHC: 30.6 g/dL (ref 30.0–36.0)
MCV: 101.7 fL — ABNORMAL HIGH (ref 80.0–100.0)
Monocytes Absolute: 0.3 10*3/uL (ref 0.1–1.0)
Monocytes Relative: 6 %
Neutro Abs: 3.6 10*3/uL (ref 1.7–7.7)
Neutrophils Relative %: 83 %
Platelets: 193 10*3/uL (ref 150–400)
RBC: 3.02 MIL/uL — ABNORMAL LOW (ref 3.87–5.11)
RDW: 14.9 % (ref 11.5–15.5)
WBC: 4.3 10*3/uL (ref 4.0–10.5)
nRBC: 0 % (ref 0.0–0.2)

## 2022-09-12 LAB — RESP PANEL BY RT-PCR (RSV, FLU A&B, COVID)  RVPGX2
Influenza A by PCR: POSITIVE — AB
Influenza B by PCR: NEGATIVE
Resp Syncytial Virus by PCR: NEGATIVE
SARS Coronavirus 2 by RT PCR: NEGATIVE

## 2022-09-12 LAB — GLUCOSE, CAPILLARY: Glucose-Capillary: 76 mg/dL (ref 70–99)

## 2022-09-12 LAB — HEPATITIS B SURFACE ANTIGEN: Hepatitis B Surface Ag: NONREACTIVE

## 2022-09-12 LAB — CBG MONITORING, ED: Glucose-Capillary: 89 mg/dL (ref 70–99)

## 2022-09-12 MED ORDER — ONDANSETRON HCL 4 MG/2ML IJ SOLN
4.0000 mg | Freq: Once | INTRAMUSCULAR | Status: AC
  Administered 2022-09-12: 4 mg via INTRAVENOUS
  Filled 2022-09-12: qty 2

## 2022-09-12 MED ORDER — SODIUM CHLORIDE 0.9 % IV SOLN
1.0000 g | Freq: Once | INTRAVENOUS | Status: AC
  Administered 2022-09-12: 1 g via INTRAVENOUS
  Filled 2022-09-12: qty 10

## 2022-09-12 MED ORDER — DEXTROMETHORPHAN POLISTIREX ER 30 MG/5ML PO SUER
15.0000 mg | Freq: Three times a day (TID) | ORAL | Status: DC | PRN
  Administered 2022-09-12 – 2022-09-19 (×10): 15 mg via ORAL
  Filled 2022-09-12 (×15): qty 5

## 2022-09-12 MED ORDER — SODIUM CHLORIDE 0.9 % IV BOLUS
500.0000 mL | Freq: Once | INTRAVENOUS | Status: AC
  Administered 2022-09-12: 500 mL via INTRAVENOUS

## 2022-09-12 MED ORDER — ALPRAZOLAM 0.5 MG PO TABS
1.0000 mg | ORAL_TABLET | Freq: Every day | ORAL | Status: DC
  Administered 2022-09-12 – 2022-09-19 (×8): 1 mg via ORAL
  Filled 2022-09-12 (×8): qty 2

## 2022-09-12 MED ORDER — SODIUM CHLORIDE 0.9 % IV SOLN
125.0000 mg | INTRAVENOUS | Status: DC
  Administered 2022-09-13 – 2022-09-18 (×3): 125 mg via INTRAVENOUS
  Filled 2022-09-12 (×4): qty 10

## 2022-09-12 MED ORDER — SODIUM CHLORIDE 0.9 % IV SOLN
500.0000 mg | Freq: Once | INTRAVENOUS | Status: AC
  Filled 2022-09-12: qty 5

## 2022-09-12 MED ORDER — AMLODIPINE BESYLATE 10 MG PO TABS
10.0000 mg | ORAL_TABLET | Freq: Every day | ORAL | Status: DC
  Administered 2022-09-13 – 2022-09-19 (×7): 10 mg via ORAL
  Filled 2022-09-12 (×7): qty 1

## 2022-09-12 MED ORDER — INSULIN ASPART 100 UNIT/ML IJ SOLN: 0.0000 [IU] | Freq: Three times a day (TID) | INTRAMUSCULAR | Status: DC

## 2022-09-12 MED ORDER — ATORVASTATIN CALCIUM 40 MG PO TABS
40.0000 mg | ORAL_TABLET | Freq: Every day | ORAL | Status: DC
  Administered 2022-09-13 – 2022-09-19 (×7): 40 mg via ORAL
  Filled 2022-09-12 (×8): qty 1

## 2022-09-12 MED ORDER — SODIUM BICARBONATE 650 MG PO TABS
650.0000 mg | ORAL_TABLET | Freq: Two times a day (BID) | ORAL | Status: DC
  Administered 2022-09-12: 650 mg via ORAL
  Filled 2022-09-12 (×2): qty 1

## 2022-09-12 MED ORDER — CHLORHEXIDINE GLUCONATE CLOTH 2 % EX PADS
6.0000 | MEDICATED_PAD | Freq: Every day | CUTANEOUS | Status: DC
  Administered 2022-09-13 – 2022-09-15 (×3): 6 via TOPICAL

## 2022-09-12 MED ORDER — HEPARIN SODIUM (PORCINE) 5000 UNIT/ML IJ SOLN
5000.0000 [IU] | Freq: Three times a day (TID) | INTRAMUSCULAR | Status: DC
  Administered 2022-09-12 – 2022-09-20 (×21): 5000 [IU] via SUBCUTANEOUS
  Filled 2022-09-12 (×22): qty 1

## 2022-09-12 MED ORDER — CARVEDILOL 3.125 MG PO TABS
3.1250 mg | ORAL_TABLET | Freq: Two times a day (BID) | ORAL | Status: DC
  Administered 2022-09-12 – 2022-09-19 (×13): 3.125 mg via ORAL
  Filled 2022-09-12 (×12): qty 1

## 2022-09-12 MED ORDER — LEVOTHYROXINE SODIUM 75 MCG PO TABS
75.0000 ug | ORAL_TABLET | Freq: Every day | ORAL | Status: DC
  Administered 2022-09-13 – 2022-09-20 (×7): 75 ug via ORAL
  Filled 2022-09-12 (×8): qty 1

## 2022-09-12 MED ORDER — DARBEPOETIN ALFA 100 MCG/0.5ML IJ SOSY
100.0000 ug | PREFILLED_SYRINGE | INTRAMUSCULAR | Status: DC
  Administered 2022-09-13: 100 ug via SUBCUTANEOUS
  Filled 2022-09-12 (×2): qty 0.5

## 2022-09-12 MED ORDER — HYDRALAZINE HCL 50 MG PO TABS
50.0000 mg | ORAL_TABLET | Freq: Three times a day (TID) | ORAL | Status: DC
  Administered 2022-09-12 – 2022-09-20 (×24): 50 mg via ORAL
  Filled 2022-09-12 (×16): qty 1
  Filled 2022-09-12: qty 2
  Filled 2022-09-12 (×9): qty 1

## 2022-09-12 NOTE — Consult Note (Signed)
[facility] Renal Consultation Note  Requesting MD: Saverio Danker Indication for Consultation: advanced CKD-  was to start HD today   HPI:  Norma Coleman is a 89 y.o. female with HTN, V6HM, diastolic heart failure.  She also is noted to have advanced CKD-  was being followed by a nephrologist in [place]-  getting prepared for dialysis -  had AVF placed.   Has recently moved here to [facility] to live with her daughter.  Saw Dr. Joylene Grapes at [facility]-  was c/o fatigue and a poor appetite, AVF was ready so decision was made to go ahead and start her on dialysis-  was to go to the TCU [facility] today even though had no intention of doing home dialysis.   Pt and daughter live in [place] so the plan would be to eventually go to [facility] for [facility]HD.  Pt had been around ill contacts over the holidays-  over the last 4 days has felt more fatigued and started to develop a cough-  really not feeling like doing anything-  talked to [place] at the kidney center and was advised to come to the hospital- has tested positive for the flu - is being admitted for supportive care.  We are asked to see for dialysis needs.  Pt denies nausea, vomiting, itching or muscle cramps.  Numbers on blood work are not much worse than she has had in the past  Creatinine, Ser  Date/Time Value Ref Range Status  09/12/2022 11:04 AM 4.87 (H) 0.44 - 1.00 mg/dL Final  08/09/2022 06:20 AM 4.99 (H) 0.44 - 1.00 mg/dL Final  08/08/2022 06:00 AM 4.75 (H) 0.44 - 1.00 mg/dL Final  08/07/2022 05:47 AM 3.73 (H) 0.44 - 1.00 mg/dL Final  08/06/2022 09:58 AM 4.51 (H) 0.44 - 1.00 mg/dL Final  08/05/2022 02:11 PM 4.47 (H) 0.44 - 1.00 mg/dL Final     PMHx:   Past Medical History:  Diagnosis Date   DVT (deep venous thrombosis) (HCC)    GI bleed due to NSAIDs    High cholesterol    Hypertension    Skin cancer    Thyroid disease     Past Surgical History:  Procedure Laterality Date   KNEE SURGERY     TONSILLECTOMY      TUBAL LIGATION      Family Hx: History reviewed. No pertinent family history.  Social History:  reports that she has never smoked. She has never used smokeless tobacco. She reports that she does not drink alcohol and does not use drugs.  Allergies:  Allergies  Allergen Reactions   Ibuprofen Other (See Comments)    Stomach bleed   Sulfa Antibiotics     Medications: Prior to Admission medications   Medication Sig Start Date End Date Taking? Authorizing Provider  ALPRAZolam Duanne Moron) 1 MG tablet Take 1 mg by mouth at bedtime as needed for anxiety.   Yes [provider]  amLODipine (NORVASC) 10 MG tablet Take 10 mg by mouth daily. 06/30/22  Yes [provider]  ascorbic acid (VITAMIN C) 500 MG tablet Take 500 mg by mouth daily.   Yes [provider]  atorvastatin (LIPITOR) 40 MG tablet Take 40 mg by mouth daily. 01/09/20  Yes [provider]  Biotin 5 MG CAPS Take 5 mg by mouth daily.   Yes [provider]  carvedilol (COREG) 3.125 MG tablet Take 1 tablet by mouth 2 (two) times daily with a meal. 06/30/22  Yes [provider]  cholecalciferol (VITAMIN D3) 25 MCG (1000 UNIT) tablet Take 1,000 Units by mouth daily.   Yes [provider]  DM-Doxylamine-Acetaminophen (NYQUIL COLD & FLU PO) Take 5 mLs by mouth daily as needed (cough).   Yes [provider]  furosemide (LASIX) 40 MG tablet Take 40 mg by mouth. 08/03/21  Yes [provider]  hydrALAZINE (APRESOLINE) 50 MG tablet Take 50 mg by mouth 3 (three) times daily. 04/28/22  Yes [provider]  levothyroxine (SYNTHROID) 75 MCG tablet Take 75 mcg by mouth daily before breakfast. 06/30/22  Yes [provider]  Multiple Vitamins-Minerals (MULTIVITAMIN WITH MINERALS) tablet Take 1 tablet by mouth daily.   Yes [provider]  oxyCODONE (OXY IR/ROXICODONE) 5 MG immediate release tablet Take 5 mg by mouth daily as needed for moderate pain or  severe pain. 05/11/22  Yes [provider]  sodium bicarbonate 650 MG tablet Take 650 mg by mouth 2 (two) times daily. 07/11/21  Yes [provider]  vitamin B-12 (CYANOCOBALAMIN) 100 MCG tablet Take 100 mcg by mouth daily.   Yes [provider]    I have reviewed the patient's current medications.  Labs:  Results for orders placed or performed during the hospital encounter of 09/12/22 (from the past 48 hour(s))  Resp panel by RT-PCR (RSV, Flu A&B, Covid) Anterior Nasal Swab     Status: Abnormal   Collection Time: 09/12/22 11:01 AM   Specimen: Anterior Nasal Swab  Result Value Ref Range   SARS Coronavirus 2 by RT PCR NEGATIVE NEGATIVE    Comment: (NOTE) SARS-CoV-2 target nucleic acids are NOT DETECTED.  The SARS-CoV-2 RNA is generally detectable in upper respiratory specimens during the acute phase of infection. The lowest concentration of SARS-CoV-2 viral copies this assay can detect is 138 copies/mL. A negative result does not preclude SARS-Cov-2 infection and should not be used as the sole basis for treatment or other patient management decisions. A negative result may occur with  improper specimen collection/handling, submission of specimen other than nasopharyngeal swab, presence of viral mutation(s) within the areas targeted by this assay, and inadequate number of viral copies(<138 copies/mL). A negative result must be combined with clinical observations, patient history, and epidemiological information. The expected result is Negative.  Fact Sheet for Patients:  EntrepreneurPulse.com.au  Fact Sheet for Healthcare Providers:  IncredibleEmployment.be  This test is no t yet approved or cleared by the [place] FDA and  has been authorized for detection and/or diagnosis of SARS-CoV-2 by FDA under an Emergency Use Authorization (EUA). This EUA will remain  in effect (meaning this test can be used) for the  duration of the COVID-19 declaration under Section 564(b)(1) of the Act, 21 U.S.C.section 360bbb-3(b)(1), unless the authorization is terminated  or revoked sooner.       Influenza A by PCR POSITIVE (A) NEGATIVE   Influenza B by PCR NEGATIVE NEGATIVE    Comment: (NOTE) The Xpert Xpress SARS-CoV-2/FLU/RSV plus assay is intended as an aid in the diagnosis of influenza from Nasopharyngeal swab specimens and should not be used as a sole basis for treatment. Nasal washings and aspirates are unacceptable for Xpert Xpress SARS-CoV-2/FLU/RSV testing.  Fact Sheet for Patients: EntrepreneurPulse.com.au  Fact Sheet for Healthcare Providers: IncredibleEmployment.be  This test is not yet approved or cleared by the [place] FDA and has been authorized for detection and/or diagnosis of SARS-CoV-2 by FDA under an Emergency Use Authorization (EUA). This EUA will remain in effect (meaning this test can be used) for  the duration of the COVID-19 declaration under Section 564(b)(1) of the Act, 21 U.S.C. section 360bbb-3(b)(1), unless the authorization is terminated or revoked.     Resp Syncytial Virus by PCR NEGATIVE NEGATIVE    Comment: (NOTE) Fact Sheet for Patients: EntrepreneurPulse.com.au  Fact Sheet for Healthcare Providers: IncredibleEmployment.be  This test is not yet approved or cleared by the [place] FDA and has been authorized for detection and/or diagnosis of SARS-CoV-2 by FDA under an Emergency Use Authorization (EUA). This EUA will remain in effect (meaning this test can be used) for the duration of the COVID-19 declaration under Section 564(b)(1) of the Act, 21 U.S.C. section 360bbb-3(b)(1), unless the authorization is terminated or revoked.  Performed at [facility], [place] [address], [place], [place] [address]   Basic metabolic panel     Status: Abnormal   Collection Time: 09/12/22  11:04 AM  Result Value Ref Range   Sodium 135 135 - 145 mmol/L   Potassium 4.9 3.5 - 5.1 mmol/L   Chloride 109 98 - 111 mmol/L   CO2 16 (L) 22 - 32 mmol/L   Glucose, Bld 105 (H) 70 - 99 mg/dL    Comment: Glucose reference range applies only to samples taken after fasting for at least 8 hours.   BUN 51 (H) 8 - 23 mg/dL   Creatinine, Ser 4.87 (H) 0.44 - 1.00 mg/dL   Calcium 9.0 8.9 - 10.3 mg/dL   GFR, Estimated 8 (L) >60 mL/min    Comment: (NOTE) Calculated using the CKD-EPI Creatinine Equation (2021)    Anion gap 10 5 - 15    Comment: Performed at [facility] [address] [place], [place] [address]  CBC with Differential     Status: Abnormal   Collection Time: 09/12/22 11:04 AM  Result Value Ref Range   WBC 4.3 4.0 - 10.5 K/uL   RBC 3.02 (L) 3.87 - 5.11 MIL/uL   Hemoglobin 9.4 (L) 12.0 - 15.0 g/dL   HCT 30.7 (L) 36.0 - 46.0 %   MCV 101.7 (H) 80.0 - 100.0 fL   MCH 31.1 26.0 - 34.0 pg   MCHC 30.6 30.0 - 36.0 g/dL   RDW 14.9 11.5 - 15.5 %   Platelets 193 150 - 400 K/uL   nRBC 0.0 0.0 - 0.2 %   Neutrophils Relative % 83 %   Neutro Abs 3.6 1.7 - 7.7 K/uL   Lymphocytes Relative 10 %   Lymphs Abs 0.4 (L) 0.7 - 4.0 K/uL   Monocytes Relative 6 %   Monocytes Absolute 0.3 0.1 - 1.0 K/uL   Eosinophils Relative 0 %   Eosinophils Absolute 0.0 0.0 - 0.5 K/uL   Basophils Relative 0 %   Basophils Absolute 0.0 0.0 - 0.1 K/uL   Immature Granulocytes 1 %   Abs Immature Granulocytes 0.02 0.00 - 0.07 K/uL    Comment: Performed at [facility], [place] [address], [place], [place] [address]     ROS:  A comprehensive review of systems was negative except for: Constitutional: positive for fatigue and malaise Respiratory: positive for cough and dyspnea on exertion Gastrointestinal: positive for decreased appetite  Physical Exam: Vitals:   09/12/22 0934 09/12/22 1206  BP: (!) 150/50 (!) 147/58  Pulse: (!) 54 68  Resp: 18 18  Temp: 97.9 F (36.6 C) 97.8 F (36.6 C)  SpO2:  95% 90%     General: thin, but pretty well appearing WF, NAD HEENT: PERRLA, EOMI, mucous membranes slightly  dry  Neck: no JVD Heart: RRR Lungs: CBS bilat  Abdomen: soft, non tender Extremities: really no edema Skin: warm and dry Neuro: alert, just globally weak Left upper arm AVF with good thrill and bruit  Assessment/Plan: 89 year old WF advanced CKD and plans to start dialysis as OP-  now has come down with the flu 1.Renal- 89 year old WF with a GFR of 8.  In talking to her it sounds like she was/is having uremic sxms.  Now is feeling more fatigued, likely a combination of flu and uremic sxms.  I think it is reasonable to go ahead and get her HD started in the hospital.  I dont understand why she was going to the TCU-  I would recommend at discharge to just go ahead and start at [facility].  Will plan on first HD tomorrow-  then likely treatment #2 on Friday given the fact that we are using an AVF 2. Hypertension/volume  - does not seem that overloaded-  has not been taking in much PO-  does not need much UF with HD  3. Anemia  - hgb in the 9's.  Had been getting ESA and iron as OP in [place]-  iron sat on 12/13 was 29 with ferritin 111-  give iron  4. Bones-  last phos was 4.8 and PTH 116-  only on nutritional vitamin D and no binder   Louis Meckel 09/12/2022, 4:43 PM

## 2022-09-12 NOTE — ED Provider Notes (Signed)
[facility] EMERGENCY DEPARTMENT Provider Note   CSN: [id number] Arrival date & time: 09/12/22  0932     History {Add pertinent medical, surgical, social history, OB history to HPI:1} Chief Complaint  Patient presents with   cough, vomiting and new  dialysis patient    Norma Coleman is a 89 y.o. female.  Patient is a 89 year old female who presents with flulike symptoms.  She states it has been flu at her house over the holidays.  She started having flulike symptoms about 4 to 5 days ago.  She reports that she has had coughing with some posttussive emesis.  She has had decreased appetite and generalized weakness.  She has had some intermittent diarrhea.  She denies any fevers but EMS reported her to have a low-grade fever.  She does have a history of end-stage renal disease and was due to start dialysis today.       Home Medications Prior to Admission medications   Medication Sig Start Date End Date Taking? Authorizing Provider  ALPRAZolam Duanne Moron) 1 MG tablet Take 1 mg by mouth at bedtime as needed for anxiety.    [provider]  amLODipine (NORVASC) 10 MG tablet Take 10 mg by mouth daily. 06/30/22   [provider]  ascorbic acid (VITAMIN C) 500 MG tablet Take 500 mg by mouth daily.    [provider]  atorvastatin (LIPITOR) 40 MG tablet Take 40 mg by mouth daily. 01/09/20   [provider]  Biotin 5 MG CAPS Take 5 mg by mouth daily.    [provider]  carvedilol (COREG) 3.125 MG tablet Take 1 tablet by mouth 2 (two) times daily with a meal. 06/30/22   [provider]  cholecalciferol (VITAMIN D3) 25 MCG (1000 UNIT) tablet Take 1,000 Units by mouth daily.    [provider]  furosemide (LASIX) 40 MG tablet Take 40 mg by mouth. 08/03/21   [provider]  hydrALAZINE (APRESOLINE) 50 MG tablet Take 50 mg by mouth 3 (three) times daily. 04/28/22   [provider]  levothyroxine  (SYNTHROID) 75 MCG tablet Take 75 mcg by mouth daily before breakfast. 06/30/22   [provider]  Multiple Vitamins-Minerals (MULTIVITAMIN WITH MINERALS) tablet Take 1 tablet by mouth daily.    [provider]  sodium bicarbonate 650 MG tablet Take 650 mg by mouth 2 (two) times daily. 07/11/21   [provider]  vitamin B-12 (CYANOCOBALAMIN) 100 MCG tablet Take 100 mcg by mouth daily.    [provider]      Allergies    Ibuprofen and Sulfa antibiotics    Review of Systems   Review of Systems  Constitutional:  Positive for fatigue. Negative for chills, diaphoresis and fever.  HENT:  Positive for congestion and rhinorrhea. Negative for sneezing.   Eyes: Negative.   Respiratory:  Positive for cough. Negative for chest tightness and shortness of breath.   Cardiovascular:  Negative for chest pain and leg swelling.  Gastrointestinal:  Positive for diarrhea and vomiting. Negative for abdominal pain, blood in stool and nausea.  Genitourinary:  Negative for difficulty urinating, flank pain, frequency and hematuria.  Musculoskeletal:  Positive for myalgias. Negative for arthralgias and back pain.  Skin:  Negative for rash.  Neurological:  Positive for weakness. Negative for dizziness, speech difficulty, numbness and headaches.    Physical Exam Updated Vital Signs BP (!) 150/50 (BP Location: Right Arm)   Pulse (!) 54   Temp 97.9 F (  36.6 C) (Oral)   Resp 18   Ht '5\' 3"'$  (1.6 m)   Wt 68 kg   SpO2 95%   BMI 26.57 kg/m  Physical Exam Constitutional:      Appearance: She is well-developed.  HENT:     Head: Normocephalic and atraumatic.  Eyes:     Pupils: Pupils are equal, round, and reactive to light.  Cardiovascular:     Rate and Rhythm: Normal rate and regular rhythm.     Heart sounds: Normal heart sounds.  Pulmonary:     Effort: Pulmonary effort is normal. No respiratory distress.     Breath sounds: Normal breath sounds. No wheezing or rales.   Chest:     Chest wall: No tenderness.  Abdominal:     General: Bowel sounds are normal.     Palpations: Abdomen is soft.     Tenderness: There is no abdominal tenderness. There is no guarding or rebound.  Musculoskeletal:        General: Normal range of motion.     Cervical back: Normal range of motion and neck supple.  Lymphadenopathy:     Cervical: No cervical adenopathy.  Skin:    General: Skin is warm and dry.     Findings: No rash.  Neurological:     Mental Status: She is alert and oriented to person, place, and time.     ED Results / Procedures / Treatments   Labs (all labs ordered are listed, but only abnormal results are displayed) Labs Reviewed  RESP PANEL BY RT-PCR (RSV, FLU A&B, COVID)  [id number]  BASIC METABOLIC PANEL  CBC WITH DIFFERENTIAL/PLATELET  URINALYSIS, ROUTINE W REFLEX MICROSCOPIC    EKG None  Radiology No results found.  Procedures Procedures  {Document cardiac monitor, telemetry assessment procedure when appropriate:1}  Medications Ordered in ED Medications  sodium chloride 0.9 % bolus 500 mL (has no administration in time range)  ondansetron (ZOFRAN) injection 4 mg (has no administration in time range)    ED Course/ Medical Decision Making/ A&P                           Medical Decision Making Amount and/or Complexity of Data Reviewed Labs: ordered. Radiology: ordered.  Risk Prescription drug management.   ***  {Document critical care time when appropriate:1} {Document review of labs and clinical decision tools ie heart score, Chads2Vasc2 etc:1}  {Document your independent review of radiology images, and any outside records:1} {Document your discussion with family members, caretakers, and with consultants:1} {Document social determinants of health affecting pt's care:1} {Document your decision making why or why not admission, treatments were needed:1} Final Clinical Impression(s) / ED Diagnoses Final diagnoses:  None    Rx  / DC Orders ED Discharge Orders     None

## 2022-09-12 NOTE — H&P (Signed)
NAME:  Norma Coleman, MRN:  [id number], DOB:  1933/05/13, LOS: 0 ADMISSION DATE:  09/12/2022, Primary: Patient, No Pcp Per  CHIEF COMPLAINT:  weakness    Medical Service: Internal Medicine Teaching Service         Attending Physician: Dr. Charise Killian, MD    First Contact: Dr. Sanjuana Mae Pager: [phone]  Second Contact: Dr. Howie Ill Pager: [phone]       After Hours (After 5p/  First Contact Pager: [phone]  weekends / holidays): Second Contact Pager: [phone]   HISTORY OF PRESENT ILLNESS   Norma Coleman is 89yo person with end-stage renal disease, hypertension, type II diabetes mellitus, chronic diastolic heart failure presenting to [facility] for weakness and cough. [name] reports she was around multiple family members over the holidays who later tested positive for influenza A. Over the last 4-5 days she has experienced profound weakness, cough, vomiting, and a few episodes of diarrhea. She does report some intermittent chills and decreased appetite during this time as well. Also describes mid-sternal sharp chest pain over the last day only while she is coughing, denies changes with exertion. She also notes mild dyspnea on exertion over these last few days. Denies fevers, vision changes, hematemesis, abdominal pain, dysuria, polyuria, hematochezia. She has been taking her medications regularly. She does have history of ESRD and was initially scheduled to start dialysis today. She does not currently have a primary care physician, but is looking to establish one here in [place] soon.   PCP: Patient, No Pcp Per  ED COURSE   Patient arrived to [facility] afebrile, hemodynamically stable and sating well on room air. Initial lab results revealed stable renal function, no leukocytosis. Influenza A returned positive. IMTS subsequently consulted for admission.   PAST MEDICAL HISTORY   She,  has a past medical history of DVT (deep venous thrombosis) ([place]), GI bleed due to NSAIDs, High cholesterol,  Hypertension, Skin cancer, and Thyroid disease.   HOME MEDICATIONS   Prior to Admission medications   Medication Sig Start Date End Date Taking? Authorizing Provider  ALPRAZolam Duanne Moron) 1 MG tablet Take 1 mg by mouth at bedtime as needed for anxiety.    [provider]  amLODipine (NORVASC) 10 MG tablet Take 10 mg by mouth daily. 06/30/22   [provider]  ascorbic acid (VITAMIN C) 500 MG tablet Take 500 mg by mouth daily.    [provider]  atorvastatin (LIPITOR) 40 MG tablet Take 40 mg by mouth daily. 01/09/20   [provider]  Biotin 5 MG CAPS Take 5 mg by mouth daily.    [provider]  carvedilol (COREG) 3.125 MG tablet Take 1 tablet by mouth 2 (two) times daily with a meal. 06/30/22   [provider]  cholecalciferol (VITAMIN D3) 25 MCG (1000 UNIT) tablet Take 1,000 Units by mouth daily.    [provider]  furosemide (LASIX) 40 MG tablet Take 40 mg by mouth. 08/03/21   [provider]  hydrALAZINE (APRESOLINE) 50 MG tablet Take 50 mg by mouth 3 (three) times daily. 04/28/22   [provider]  levothyroxine (SYNTHROID) 75 MCG tablet Take 75 mcg by mouth daily before breakfast. 06/30/22   [provider]  Multiple Vitamins-Minerals (MULTIVITAMIN WITH MINERALS) tablet Take 1 tablet by mouth daily.    [provider]  sodium bicarbonate 650 MG tablet Take 650 mg by mouth 2 (two) times daily. 07/11/21   [provider]  vitamin B-12 (CYANOCOBALAMIN) 100 MCG tablet Take  100 mcg by mouth daily.    [provider]    ALLERGIES   Allergies as of 09/12/2022 - Review Complete 09/12/2022  Allergen Reaction Noted   Ibuprofen  07/19/2018   Sulfa antibiotics  07/19/2018    SOCIAL HISTORY   [name] currently lives with her daughter and her family. Prior to her last hospitalization a month ago, she was living independently. She can complete her ADL's independently but per her  daughter she needs someone "to keep an eye on her." Denies cigarette, alcohol, or recreational drug use.   FAMILY HISTORY   Her family history is not on file.   REVIEW OF SYSTEMS   ROS per history of present illness.  PHYSICAL EXAMINATION   Blood pressure (!) 147/58, pulse 68, temperature 97.8 F (36.6 C), temperature source Oral, resp. rate 18, height '5\' 3"'$  (1.6 m), weight 68 kg, SpO2 90 %.    Filed Weights   09/12/22 0937  Weight: 68 kg   GENERAL: Elderly appearing, well-developed, laying in bed in no acute distress HENT: Normocephalic, atraumatic. Moist mucous membranes. EYES: Vision grossly in tact. No conjunctival injection or scleral icterus noted. CV: Regular rate, rhythm. 2/6 systolic murmur appreciated. DP pulses 2+ bilaterally. Palpable thrill on LUE fistula.  PULM: Normal work of breathing on room air. Rales appreciated in right lower lung field. No wheezing. GI: Abdomen soft, non-tender, non-distended. Normoactive bowel sounds.  MSK: Normal bulk, tone. No peripheral edema appreciated.  SKIN: Warm, dry. No rashes or lesions noted. NEURO: Awake, alert, conversing appropriately. Grossly non-focal. Strength 4/5 throughout. PSYCH: Normal mood, affect, speech.   SIGNIFICANT DIAGNOSTIC TESTS   ECG: pending  I personally reviewed patient's ECG with my interpretation as above.  CXR: opacity appreciated in RLL  I personally reviewed patient's CXR with my interpretation as above.  LABS      Latest Ref Rng & Units 09/12/2022   11:04 AM 08/09/2022    6:20 AM 08/08/2022    6:00 AM  CBC  WBC 4.0 - 10.5 K/uL 4.3  8.2  7.7   Hemoglobin 12.0 - 15.0 g/dL 9.4  8.5  8.9   Hematocrit 36.0 - 46.0 % 30.7  28.5  29.7   Platelets 150 - 400 K/uL 193  201  207       Latest Ref Rng & Units 09/12/2022   11:04 AM 08/09/2022    6:20 AM 08/08/2022    6:00 AM  BMP  Glucose 70 - 99 mg/dL 105  100  86   BUN 8 - 23 mg/dL 51  57  54   Creatinine 0.44 - 1.00 mg/dL 4.87  4.99  4.75    Sodium 135 - 145 mmol/L 135  142  141   Potassium 3.5 - 5.1 mmol/L 4.9  4.8  4.8   Chloride 98 - 111 mmol/L 109  112  112   CO2 22 - 32 mmol/L '16  23  21   '$ Calcium 8.9 - 10.3 mg/dL 9.0  8.7  8.8     CONSULTS   nephrology  ASSESSMENT   Iyani Dresner is 89yo person with end-stage renal disease, hypertension, type II diabetes mellitus, chronic diastolic heart failure admitted 1/2 for influenza A infection causing generalized weakness.  PLAN   Principal Problem:   Influenza A  #Influenza A pneumonia  Since arrival patient has been hemodynamically stable, sating well on room air. Symptoms have been ongoing for 4-5 days now. She has had decreased PO intake, but does not appear  dehydrated on exam. Given Tamiflu is renally cleared, patient is hemodynamically stable on room air, will hold off. She does have opacity in right lower lobe with rales on exam concerning for possible bacterial etiology. Her symptoms have been persistent for 4-5 days, less likely post-influenza pneumonia. Clinically most consistent with influenza pneumonia, will hold off further antibiotics for now and follow white count. - Supplemental O2 as needed - Daily CBC - PT/OT  #End-stage renal disease Patient is seen by [employer]. ESRD likely 2/2 hypertension, diabetes. Access in LUE. Currently still makes urine, was scheduled to start dialysis today. No acute indications for emergent dialysis. Will touch base with nephrology  - Appreciate nephrology recommendations - Sodium bicarbonate '650mg'$  twice daily - Dialysis per nephro - Daily BMP  #Chronic diastolic heart failure #Hypertension Patient appears euvolemic on exam, sating well on room air. Blood pressure mildly elevated with systolics in 299-371'I. Will continue with home anti-hypertensives, hold home diuretic. - Amlodipine '10mg'$  daily - Carvedilol 3.'125mg'$  twice daily - Hydralazine '50mg'$  three times daily - Hold furosemide  #Type II diabetes  mellitus Last A1c 5.5% in November. Not on current medications at home. - SSI  #History of DVT Patient reports previous DVT "years ago" and is currently not on anticoagulation. Reports previous hematoma after anticoagulation.  - DVT prophylaxis with subcutaneous heparin  #Hyperlipidemia - Atorvastatin '40mg'$  daily  #Hypothyroidism - Levothyroxine 58mg daily  BEST PRACTICE   DIET: CM IVF: na DVT PPX: heparin BOWEL: na CODE: FULL FAM COM: discussed with daughter at bedside  DISPO: Admit patient to Observation with expected length of stay less than 2 midnights.  PSanjuan Dame MD Internal Medicine Resident PGY-3 Pager #[phone]/10/2022 1:33 PM

## 2022-09-12 NOTE — ED Triage Notes (Signed)
Pt BIB [facility] from home. Pt c/o cough, vomiting for the last 4 days. She also have diarrhea when cough. EMS pt have low grade fever. Pt is to start her first dialysis today. She has fistula in left upper arm.

## 2022-09-12 NOTE — Evaluation (Signed)
Physical Therapy Evaluation Patient Details Name: Norma Coleman MRN: [id number] DOB: [birth year] Today's Date: 09/12/2022  History of Present Illness  89 y.o. female presents to [facility] hospital on 09/12/2022 with weakness, vomiting, diarrhea and cough, found to be fluA positive. PMH includes ESRD, HTN, DMII, CHF.  Clinical Impression  Pt presents to PT with deficits in strength, power, gait, balance, endurance. Pt is generally weak, experiencing one lateral loss of balance when ambulating without UE support. Pt will benefit from continued aggressive mobilization and acute PT services in an effort to improve strength and reduce falls risk. PT anticipates the pt will progress well and be able to discharge home with continued use of her rollator and intermittent assistance from family. PT recommends HHPT, pt and family unsure on if they want to utilize HHPT or not.       Recommendations for follow up therapy are one component of a multi-disciplinary discharge planning process, led by the attending physician.  Recommendations may be updated based on patient status, additional functional criteria and insurance authorization.  Follow Up Recommendations Home health PT      Assistance Recommended at Discharge Intermittent Supervision/Assistance  Patient can return home with the following  A little help with walking and/or transfers;A little help with bathing/dressing/bathroom;Assistance with cooking/housework;Assist for transportation;Help with stairs or ramp for entrance    Equipment Recommendations None recommended by PT  Recommendations for Other Services       Functional Status Assessment Patient has had a recent decline in their functional status and demonstrates the ability to make significant improvements in function in a reasonable and predictable amount of time.     Precautions / Restrictions Precautions Precautions: Fall Precaution Comments: Flu A Restrictions Weight Bearing  Restrictions: No      Mobility  Bed Mobility Overal bed mobility: Needs Assistance Bed Mobility: Supine to Sit, Sit to Supine     Supine to sit: Supervision Sit to supine: Supervision        Transfers Overall transfer level: Needs assistance Equipment used: None Transfers: Sit to/from Stand Sit to Stand: Supervision                Ambulation/Gait Ambulation/Gait assistance: Min assist Gait Distance (Feet): 80 Feet (additional 40') Assistive device: None Gait Pattern/deviations: Step-through pattern, Drifts right/left, Staggering left Gait velocity: reduced Gait velocity interpretation: <1.31 ft/sec, indicative of household ambulator   General Gait Details: one lateral loss of balance, otherwise pt demonstrating increased sway and drift when without UE support  Stairs            Wheelchair Mobility    Modified Rankin (Stroke Patients Only)       Balance Overall balance assessment: Needs assistance Sitting-balance support: No upper extremity supported, Feet supported Sitting balance-Leahy Scale: Good     Standing balance support: No upper extremity supported, During functional activity Standing balance-Leahy Scale: Poor Standing balance comment: minG-minA                             Pertinent Vitals/Pain Pain Assessment Pain Assessment: Faces Faces Pain Scale: Hurts little more Pain Location: back Pain Descriptors / Indicators: Aching Pain Intervention(s): Monitored during session    Home Living Family/patient expects to be discharged to:: Private residence Living Arrangements: Other relatives Available Help at Discharge: Family;Available 24 hours/day Type of Home: House Home Access: Stairs to enter Entrance Stairs-Rails: Can reach both Entrance Stairs-Number of Steps: 4   Home Layout: Able to  live on main level with bedroom/bathroom Home Equipment: Rollator (4 wheels);Cane - single point      Prior Function Prior Level of  Function : Independent/Modified Independent             Mobility Comments: pt ambulates with use of rollator, one fall recently when without rollator ADLs Comments: supervision recently for ADLs from family     Hand Dominance   Dominant Hand: Right    Extremity/Trunk Assessment   Upper Extremity Assessment Upper Extremity Assessment: Generalized weakness    Lower Extremity Assessment Lower Extremity Assessment: Generalized weakness    Cervical / Trunk Assessment Cervical / Trunk Assessment: Kyphotic  Communication   Communication: No difficulties  Cognition Arousal/Alertness: Awake/alert Behavior During Therapy: WFL for tasks assessed/performed Overall Cognitive Status: Impaired/Different from baseline Area of Impairment: Safety/judgement, Awareness                         Safety/Judgement: Decreased awareness of safety Awareness: Emergent            General Comments General comments (skin integrity, edema, etc.): VSS on RA, pt reports dizziness, mildly hypoglycemic with recent blood sugar of 89    Exercises     Assessment/Plan    PT Assessment Patient needs continued PT services  PT Problem List Decreased strength;Decreased activity tolerance;Decreased balance;Decreased mobility;Decreased cognition;Decreased safety awareness;Decreased knowledge of use of DME       PT Treatment Interventions DME instruction;Gait training;Functional mobility training;Stair training;Therapeutic activities;Therapeutic exercise;Balance training;Neuromuscular re-education;Patient/family education    PT Goals (Current goals can be found in the Care Plan section)  Acute Rehab PT Goals Patient Stated Goal: to return to prior level of function PT Goal Formulation: With patient Time For Goal Achievement: 09/26/22 Potential to Achieve Goals: Good Additional Goals Additional Goal #1: Pt will score >19/24 on the DGI to indicate a reduced risk for falls    Frequency Min  3X/week     Co-evaluation PT/OT/SLP Co-Evaluation/Treatment: Yes Reason for Co-Treatment: For patient/therapist safety;To address functional/ADL transfers PT goals addressed during session: Mobility/safety with mobility;Balance;Strengthening/ROM         AM-PAC PT "6 Clicks" Mobility  Outcome Measure Help needed turning from your back to your side while in a flat bed without using bedrails?: A Little Help needed moving from lying on your back to sitting on the side of a flat bed without using bedrails?: A Little Help needed moving to and from a bed to a chair (including a wheelchair)?: A Little Help needed standing up from a chair using your arms (e.g., wheelchair or bedside chair)?: A Little Help needed to walk in hospital room?: A Little Help needed climbing 3-5 steps with a railing? : A Lot 6 Click Score: 17    End of Session   Activity Tolerance: Patient tolerated treatment well Patient left: in bed;with family/visitor present Nurse Communication: Mobility status PT Visit Diagnosis: Other abnormalities of gait and mobility (R26.89);Muscle weakness (generalized) (M62.81);History of falling (Z91.81)    Time: 1640-1701 PT Time Calculation (min) (ACUTE ONLY): 21 min   Charges:   PT Evaluation $PT Eval Low Complexity: [place], PT, DPT Acute Rehabilitation Office [place] 09/12/2022, 5:15 PM

## 2022-09-12 NOTE — Evaluation (Signed)
Occupational Therapy Evaluation Patient Details Name: Norma Coleman MRN: [id number] DOB: 14-Jun-1933 Today's Date: 09/12/2022   History of Present Illness 89 y.o. female presents to [facility] hospital on 09/12/2022 with weakness, vomiting, diarrhea and cough, found to be fluA positive. PMH includes ESRD, HTN, DMII, CHF.   Clinical Impression   Pt was evaluated s/p the above admission list, she is typically mod I at baseline with use of rollator. She lives with her daughter who assists as needed. Upon evaluation pt had functional limitations due to generalized weakness, unsteady gait, general malaise, and decreased activity tolerance. Overall she needed up to min A for mobility due to multiple LOBs, report of dizziness and no AD this session. She also requires up to min A for ADLs due to the deficits listed below. OT to continue to follow acutely. Recommend d/c to home with [facility] and support of family for safety.      Recommendations for follow up therapy are one component of a multi-disciplinary discharge planning process, led by the attending physician.  Recommendations may be updated based on patient status, additional functional criteria and insurance authorization.   Follow Up Recommendations  Home health OT (vs. no follow up, pending progression.)     Assistance Recommended at Discharge Frequent or constant Supervision/Assistance  Patient can return home with the following A little help with walking and/or transfers;A little help with bathing/dressing/bathroom    Functional Status Assessment  Patient has had a recent decline in their functional status and demonstrates the ability to make significant improvements in function in a reasonable and predictable amount of time.  Equipment Recommendations  None recommended by OT       Precautions / Restrictions Precautions Precautions: Fall Precaution Comments: Flu A Restrictions Weight Bearing Restrictions: No      Mobility Bed  Mobility Overal bed mobility: Needs Assistance Bed Mobility: Supine to Sit, Sit to Supine     Supine to sit: Supervision Sit to supine: Supervision        Transfers Overall transfer level: Needs assistance Equipment used: None Transfers: Sit to/from Stand Sit to Stand: Supervision                  Balance Overall balance assessment: Needs assistance Sitting-balance support: No upper extremity supported, Feet supported Sitting balance-Leahy Scale: Good     Standing balance support: No upper extremity supported, During functional activity Standing balance-Leahy Scale: Poor Standing balance comment: minG-minA                           ADL either performed or assessed with clinical judgement   ADL Overall ADL's : Needs assistance/impaired Eating/Feeding: Independent;Sitting   Grooming: Min guard;Standing   Upper Body Bathing: Set up;Sitting   Lower Body Bathing: Min guard;Sit to/from stand   Upper Body Dressing : Set up;Sitting   Lower Body Dressing: Min guard;Sit to/from stand   Toilet Transfer: Minimal assistance;Ambulation Toilet Transfer Details (indicate cue type and reason): no AD this date and 1x LOB Toileting- Clothing Manipulation and Hygiene: Supervision/safety;Sitting/lateral lean       Functional mobility during ADLs: Minimal assistance General ADL Comments: unsteady gait, would benefit from AD     Vision Baseline Vision/History: 0 No visual deficits Vision Assessment?: No apparent visual deficits     Perception Perception Perception Tested?: No   Praxis Praxis Praxis tested?: Not tested    Pertinent Vitals/Pain Pain Assessment Pain Assessment: Faces Faces Pain Scale: Hurts little more Pain  Location: back Pain Descriptors / Indicators: Aching Pain Intervention(s): Monitored during session, Limited activity within patient's tolerance     Hand Dominance Right   Extremity/Trunk Assessment Upper Extremity  Assessment Upper Extremity Assessment: Generalized weakness   Lower Extremity Assessment Lower Extremity Assessment: Generalized weakness   Cervical / Trunk Assessment Cervical / Trunk Assessment: Kyphotic   Communication Communication Communication: No difficulties   Cognition Arousal/Alertness: Awake/alert Behavior During Therapy: WFL for tasks assessed/performed Overall Cognitive Status: Impaired/Different from baseline Area of Impairment: Safety/judgement, Awareness                         Safety/Judgement: Decreased awareness of safety Awareness: Emergent   General Comments: limited insight to safety     General Comments  VSS on RA, pt with report of dizziness dueing ambulation. improved with sitting. BP was stable     Home Living Family/patient expects to be discharged to:: Private residence Living Arrangements: Other relatives Available Help at Discharge: Family;Available 24 hours/day Type of Home: House Home Access: Stairs to enter [employer] of Steps: 4 Entrance Stairs-Rails: Can reach both Home Layout: Able to live on main level with bedroom/bathroom         Bathroom Toilet: Standard     Home Equipment: Rollator (4 wheels);Cane - single point          Prior Functioning/Environment Prior Level of Function : Independent/Modified Independent             Mobility Comments: pt ambulates with use of rollator, one fall recently when without rollator ADLs Comments: supervision recently for ADLs from family, indep IADLs, does not drive        OT Problem List: Decreased activity tolerance;Impaired balance (sitting and/or standing);Decreased range of motion;Decreased strength;Decreased safety awareness;Decreased knowledge of use of DME or AE;Decreased knowledge of precautions      OT Treatment/Interventions: Self-care/ADL training;Therapeutic exercise;DME and/or AE instruction;Therapeutic activities;Patient/family education;Balance  training    OT Goals(Current goals can be found in the care plan section) Acute Rehab OT Goals Patient Stated Goal: to get better OT Goal Formulation: With patient Time For Goal Achievement: 09/26/22 Potential to Achieve Goals: Good ADL Goals Pt Will Perform Lower Body Bathing: with modified independence;sit to/from stand Pt Will Perform Lower Body Dressing: with modified independence;sit to/from stand Pt Will Transfer to Toilet: with modified independence;ambulating Pt Will Perform Toileting - Clothing Manipulation and hygiene: with modified independence;sitting/lateral leans  OT Frequency: Min 2X/week    Co-evaluation PT/OT/SLP Co-Evaluation/Treatment: Yes Reason for Co-Treatment: Complexity of the patient's impairments (multi-system involvement);For patient/therapist safety;To address functional/ADL transfers PT goals addressed during session: Mobility/safety with mobility;Balance;Strengthening/ROM OT goals addressed during session: ADL's and self-care      AM-PAC OT "6 Clicks" Daily Activity     Outcome Measure Help from another person eating meals?: None Help from another person taking care of personal grooming?: A Little Help from another person toileting, which includes using toliet, bedpan, or urinal?: A Little Help from another person bathing (including washing, rinsing, drying)?: A Little Help from another person to put on and taking off regular upper body clothing?: None Help from another person to put on and taking off regular lower body clothing?: A Little 6 Click Score: 20   End of Session Nurse Communication: Mobility status  Activity Tolerance: Patient tolerated treatment well Patient left: in bed;with call bell/phone within reach  OT Visit Diagnosis: Unsteadiness on feet (R26.81);Other abnormalities of gait and mobility (R26.89);Muscle weakness (generalized) (M62.81)  Time: 7026-3785 OT Time Calculation (min): 26 min Charges:  OT General  Charges $OT Visit: 1 Visit OT Evaluation $OT Eval Moderate Complexity: 1 Mod   Katlyne Nishida D Causey 09/12/2022, 5:38 PM

## 2022-09-13 ENCOUNTER — Observation Stay (HOSPITAL_COMMUNITY): Payer: Medicare PPO

## 2022-09-13 DIAGNOSIS — J101 Influenza due to other identified influenza virus with other respiratory manifestations: Secondary | ICD-10-CM | POA: Diagnosis not present

## 2022-09-13 DIAGNOSIS — Z992 Dependence on renal dialysis: Secondary | ICD-10-CM | POA: Diagnosis not present

## 2022-09-13 DIAGNOSIS — N186 End stage renal disease: Secondary | ICD-10-CM

## 2022-09-13 DIAGNOSIS — J111 Influenza due to unidentified influenza virus with other respiratory manifestations: Secondary | ICD-10-CM | POA: Diagnosis not present

## 2022-09-13 LAB — CBC
HCT: 26.7 % — ABNORMAL LOW (ref 36.0–46.0)
Hemoglobin: 8 g/dL — ABNORMAL LOW (ref 12.0–15.0)
MCH: 31 pg (ref 26.0–34.0)
MCHC: 30 g/dL (ref 30.0–36.0)
MCV: 103.5 fL — ABNORMAL HIGH (ref 80.0–100.0)
Platelets: 151 10*3/uL (ref 150–400)
RBC: 2.58 MIL/uL — ABNORMAL LOW (ref 3.87–5.11)
RDW: 15.2 % (ref 11.5–15.5)
WBC: 3.3 10*3/uL — ABNORMAL LOW (ref 4.0–10.5)
nRBC: 0 % (ref 0.0–0.2)

## 2022-09-13 LAB — COMPREHENSIVE METABOLIC PANEL
ALT: 15 U/L (ref 0–44)
AST: 22 U/L (ref 15–41)
Albumin: 2.7 g/dL — ABNORMAL LOW (ref 3.5–5.0)
Alkaline Phosphatase: 133 U/L — ABNORMAL HIGH (ref 38–126)
Anion gap: 10 (ref 5–15)
BUN: 51 mg/dL — ABNORMAL HIGH (ref 8–23)
CO2: 17 mmol/L — ABNORMAL LOW (ref 22–32)
Calcium: 8.1 mg/dL — ABNORMAL LOW (ref 8.9–10.3)
Chloride: 111 mmol/L (ref 98–111)
Creatinine, Ser: 4.91 mg/dL — ABNORMAL HIGH (ref 0.44–1.00)
GFR, Estimated: 8 mL/min — ABNORMAL LOW (ref >60)
Glucose, Bld: 80 mg/dL (ref 70–99)
Potassium: 5.1 mmol/L (ref 3.5–5.1)
Sodium: 138 mmol/L (ref 135–145)
Total Bilirubin: 0.4 mg/dL (ref 0.3–1.2)
Total Protein: 5.7 g/dL — ABNORMAL LOW (ref 6.5–8.1)

## 2022-09-13 LAB — GLUCOSE, CAPILLARY
Glucose-Capillary: 84 mg/dL (ref 70–99)
Glucose-Capillary: 89 mg/dL (ref 70–99)

## 2022-09-13 LAB — HEPATITIS B SURFACE ANTIBODY, QUANTITATIVE: Hep B S AB Quant (Post): <3.1 m[IU]/mL — ABNORMAL LOW (ref >9.9)

## 2022-09-13 MED ORDER — CHLORHEXIDINE GLUCONATE CLOTH 2 % EX PADS: 6.0000 | MEDICATED_PAD | Freq: Every day | CUTANEOUS | Status: DC

## 2022-09-13 MED ORDER — SODIUM ZIRCONIUM CYCLOSILICATE 10 G PO PACK
10.0000 g | PACK | Freq: Once | ORAL | Status: AC
  Administered 2022-09-13: 10 g via ORAL
  Filled 2022-09-13: qty 1

## 2022-09-13 MED ORDER — BENZONATATE 100 MG PO CAPS
100.0000 mg | ORAL_CAPSULE | Freq: Three times a day (TID) | ORAL | Status: DC | PRN
  Administered 2022-09-13 – 2022-09-19 (×13): 100 mg via ORAL
  Filled 2022-09-13 (×13): qty 1

## 2022-09-13 NOTE — Progress Notes (Signed)
Subjective:   Summary: Norma [facility] is a 89 y.o. year old female currently admitted on the IMTS HD#0 for influenza related pneumonia.  Overnight Events: Pt coughing, required 3L of O2 and dextromethorphan   Pt was seen this AM bedside. She still has a cough, however was saturating well on room air. She denies any chest pain, current dyspnea, and has been ambulating and using the restroom ok. She states she is tired from the attempted dialysis session because she was down there for a while.   Objective:  Vital signs in last 24 hours: Vitals:   09/13/22 0159 09/13/22 0555 09/13/22 0742 09/13/22 1203  BP:  (!) 135/57 (!) 139/56 (!) 145/61  Pulse:  66 66 65  Resp:  '18 18 18  '$ Temp:  98.4 F (36.9 C) 98.5 F (36.9 C) 98.2 F (36.8 C)  TempSrc:  Oral Oral Oral  SpO2: 93% 97% 98% 97%  Weight:      Height:       Supplemental O2: Room Air 95%   Physical Exam:  Constitutional: Pleasant, elderly female, occasional cough, in no acute distress Cardiovascular: RRR, no murmurs, rubs or gallops Pulmonary/Chest: Bilateral crackles and wheezes heard throughout lung field  Abdominal: soft, non-tender, non-distended Skin: warm and dry, thrill palpated at fistula site, non-erythematous and bandaged   Filed Weights   09/12/22 0937  Weight: 68 kg    No intake or output data in the 24 hours ending 09/13/22 1322 Net IO Since Admission: No IO data has been entered for this period [09/13/22 1322]  Pertinent Labs:    Latest Ref Rng & Units 09/13/2022    2:17 AM 09/12/2022   11:04 AM 08/09/2022    6:20 AM  CBC  WBC 4.0 - 10.5 K/uL 3.3  4.3  8.2   Hemoglobin 12.0 - 15.0 g/dL 8.0  9.4  8.5   Hematocrit 36.0 - 46.0 % 26.7  30.7  28.5   Platelets 150 - 400 K/uL 151  193  201        Latest Ref Rng & Units 09/13/2022    2:17 AM 09/12/2022   11:04 AM 08/09/2022    6:20 AM  CMP  Glucose 70 - 99 mg/dL 80  105  100   BUN 8 - 23 mg/dL 51  51  57   Creatinine 0.44 - 1.00  mg/dL 4.91  4.87  4.99   Sodium 135 - 145 mmol/L 138  135  142   Potassium 3.5 - 5.1 mmol/L 5.1  4.9  4.8   Chloride 98 - 111 mmol/L 111  109  112   CO2 22 - 32 mmol/L '17  16  23   '$ Calcium 8.9 - 10.3 mg/dL 8.1  9.0  8.7   Total Protein 6.5 - 8.1 g/dL 5.7     Total Bilirubin 0.3 - 1.2 mg/dL 0.4     Alkaline Phos 38 - 126 U/L 133     AST 15 - 41 U/L 22     ALT 0 - 44 U/L 15         Assessment/Plan:   Principal Problem:   Influenza A   Patient Summary: Norma [facility] is a 89 y.o. with a pertinent PMH of end-stage renal disease, hypertension, type II diabetes mellitus, chronic diastolic heart failure, who presented with weakness and cough and admitted for influenza A pneumonia.    #Influenza A  Pneumonia Pt was reportedly saturating at 4L nasal cannula, and does not need oxygen at baseline. Initial chest x-ray showed right lower lung airspace disease/consolidation significant for influenza pneumonia. However, at time of admission, she did not require oxygen, and needed more overnight. She still has a non-productive cough, likely secondary to the influenza infection. Unsure if transient increased oxygen requirement are due to fluid overloaded state secondary to not getting her dialysis, Heart failure exacerbation, or superinfection of influenza infection with a bacterial infection.   Plan:  - Repeat Chest X-Ray  - Continue dextromethorphan - Will start tessalon perles for cough   #ESRD on HD  Pt has had vascular access site since 8/31. On attempt to receive dialysis today, vascular access site was infiltrated, and unable to initiate first dialysis session. Per nephro, will attempt tomorrow.   Plan:  - Appreciate nephrology recommendations  - HD tomorrow  #Chronic Diastolic Heart Failure  #Hypertension Unsure if increased oxygen requirement is due to a heart failure exacerbation, however pt did seem euvolemic on exam. Systolic blood pressure has ranged from 130-145. Lasix was  also held on admission in the setting of dialysis initiation. Will continue amlodipine '10mg'$  daily, carvedilol 3.'125mg'$  BID, and Hydralazine '50mg'$  TID.    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 Will continue to monitor Hb with CBC, and continue Aranesp injections.    #Hyperlipidemia Atorvastatin '40mg'$  daily  #Hypothyroidism  Levothyroxine 60mg daily  Diet: Renal IVF: None,None VTE: Heparin Code: Full Family Update: Daughter at bedside   Dispo: Anticipated discharge to Home in 3 days pending medical management. .Drucie Opitz MD PGY-1 Internal Medicine Resident Pager Number [phone] contact the on call pager after 5 pm and on weekends at [phone]

## 2022-09-13 NOTE — Progress Notes (Signed)
New Dialysis Start    Patient identified as new dialysis start. Kidney Education packet assembled and given. Discussed the following items with patient:     Current medications and possible changes once started:  Discussed that patient's medications may change over time.  Ex; hypertension medications and diabetes medication.  Nephrologists will adjust as needed.   Fluid restrictions reviewed:  32 oz daily goal:  All liquids count; soups, ice, jello, fruit.    Phosphorus and potassium: Handout given showing high potassium and phosphorus foods.  Alternative food and drink options given.   Family support:  Daughter at bedside, very supportive.   Outpatient Clinic Resources:  Discussed roles of Outpatient clinic staff and advised to make a list of needs, if any, to talk with outpatient staff if needed.   Care plan schedule: Informed patient of Care Plans in outpatient setting and to participate in the care plan.  An invitation would be given from outpatient clinic.    Dialysis Access Options:  Reviewed access options with patients. Discussed in detail about care at home with new AVG & AVF. Reviewed checking bruit and thrill. If dialysis catheter present, educated that patient could not take showers.  Catheter dressing changes were to be done by outpatient clinic staff only.   Home therapy options:  Educated patient about home therapy options:  PD vs home hemo.     Patient verbalized understanding. Will continue to round on patient during admission.    Aurther Loft Dialysis Nurse Coordinator [phone]

## 2022-09-13 NOTE — Plan of Care (Signed)
Per nursing AVF infiltrated.  Will rest arm and retry tomorrow.  If not able to run tomorrow then anticipate she would need a catheter.    Claudia Desanctis, MD  11:17 AM 09/13/2022

## 2022-09-13 NOTE — Progress Notes (Addendum)
Case discussed with nephrologist this morning. Providers feel it best for pt to be placed in a local clinic for HD at d/c instead of going to [place]. Called pt's daughter to obtain additional info and left a message requesting a return call. Will await response from daughter.   Melven Sartorius Renal Navigator [phone]  Addendum at 11:31 am: Spoke to pt's daughter via phone. Daughter confirms that [place] is the closest to daughter's home. Discussed preference for schedule as well. Clinicals faxed to [facility] admissions this morning for review. Case discussed with [facility] admissions staff as well. Daughter plans to transport pt to HD at d/c. Will assist as needed.

## 2022-09-13 NOTE — Progress Notes (Signed)
[facility] Progress Note  Name: Norma [facility] MRN: [id number] DOB: 27-Nov-1932  Chief Complaint:  cough  Subjective:  She was advised to come to the [facility] to start HD in lieu of outpatient as she felt poorly as below.  Found to be flu A positive.  Hasn't had first HD treatment yet.  She isn't normally on oxygen at home and has most recently been on 3.5 liters here.     Review of systems:  She reports some shortness of breath; no chest pain Denies n/v/d   ---------- Background on consult:  Norma [facility] is a 89 y.o. female with HTN, D7AJ, diastolic heart failure.  She also is noted to have advanced CKD-  was being followed by a nephrologist in [place]-  getting prepared for dialysis -  had AVF placed.   Has recently moved here to [facility] to live with her daughter.  Saw Dr. Joylene Grapes at [facility]-  was c/o fatigue and a poor appetite, AVF was ready so decision was made to go ahead and start her on dialysis-  was to go to the TCU at [facility] kidney center today even though had no intention of doing home dialysis.   Pt and daughter live in [place] so the plan would be to eventually go to [place] for IHD.  Pt had been around ill contacts over the holidays-  over the last 4 days has felt more fatigued and started to develop a cough-  really not feeling like doing anything-  talked to [place] at the kidney center and was advised to come to the [facility]- has tested positive for the flu - is being admitted for supportive care.  We are asked to see for dialysis needs.  Pt denies nausea, vomiting, itching or muscle cramps.  Numbers on blood work are not much worse than she has had in the past   No intake or output data in the 24 hours ending 09/13/22 0728  Vitals:  Vitals:   09/13/22 0045 09/13/22 0046 09/13/22 0159 09/13/22 0555  BP:    (!) 135/57  Pulse: 67   66  Resp: 20   18  Temp:    98.4 F (36.9 C)  TempSrc:    Oral  SpO2: (!) 89% 92% 93% 97%  Weight:       Height:         Physical Exam:  General adult female in bed in no acute distress HEENT normocephalic atraumatic extraocular movements intact sclera anicteric Neck supple trachea midline Lungs clear to auscultation bilaterally normal work of breathing at rest on 3.5 liters oxygen  Heart S1S2 no rub Abdomen soft nontender nondistended Extremities no edema  Psych normal mood and affect Neuro alert and oriented x3 provides history and follows commands Access LUE AVF with bruit and thrill   Medications reviewed   Labs:     Latest Ref Rng & Units 09/13/2022    2:17 AM 09/12/2022   11:04 AM 08/09/2022    6:20 AM  BMP  Glucose 70 - 99 mg/dL 80  105  100   BUN 8 - 23 mg/dL 51  51  57   Creatinine 0.44 - 1.00 mg/dL 4.91  4.87  4.99   Sodium 135 - 145 mmol/L 138  135  142   Potassium 3.5 - 5.1 mmol/L 5.1  4.9  4.8   Chloride 98 - 111 mmol/L 111  109  112   CO2 22 - 32 mmol/L 17  16  23  Calcium 8.9 - 10.3 mg/dL 8.1  9.0  8.7      Assessment/Plan:   89 year old WF advanced CKD and plans to start dialysis as OP-  now has come down with the flu  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   Rather than triage to TCU, she has been recommended for placement outpatient at [facility].  Will discuss with HD SW today.   HD today (first treatment) then likely treatment #2 on Friday given the fact that we are using an AVF 2. Hypertension/volume  - no overt overload and HTN controlled   3. Influenza A - per primary team.  I have requested that the patient have signage and appropriate isolation  4. Anemia of CKD  - Had been getting ESA and iron as OP in [place]-  iron sat on 12/13 was 29 with ferritin 111.  We are giving iron.  Aranesp 100 mcg weekly on Wednesday is ordered   4. Metabolic bone disease-  last phos was 4.8 and PTH 116-  not on activated vitamin D and no binder  Disposition -  starting HD inpatient as above.  We are re-triaging her outpatient HD unit from the TCU if able.    Claudia Desanctis, MD 09/13/2022 8:09 AM

## 2022-09-13 NOTE — Procedures (Signed)
Pt. AVF inflitrated and will come back tomorrow, applied ice pack to arm and notified MD Harrie Jeans of results.

## 2022-09-13 NOTE — Progress Notes (Signed)
Mobility Specialist Progress Note:   09/13/22 0937  Mobility  Activity Ambulated with assistance in room  Level of Assistance Contact guard assist, steadying assist  Assistive Device Front wheel walker  Distance Ambulated (ft) 100 ft  Activity Response Tolerated well  Mobility Referral Yes  $Mobility charge 1 Mobility   Pt received in bed and agreeable. No complaints. Independent with pericare. Pt left in bed with all needs met and call bell in reach.   Andrey Campanile Mobility Specialist Please contact via SecureChat or  Rehab office at [phone]

## 2022-09-14 ENCOUNTER — Encounter (HOSPITAL_COMMUNITY): Payer: Medicare PPO

## 2022-09-14 DIAGNOSIS — J101 Influenza due to other identified influenza virus with other respiratory manifestations: Secondary | ICD-10-CM

## 2022-09-14 DIAGNOSIS — E440 Moderate protein-calorie malnutrition: Secondary | ICD-10-CM | POA: Diagnosis present

## 2022-09-14 DIAGNOSIS — E7800 Pure hypercholesterolemia, unspecified: Secondary | ICD-10-CM | POA: Diagnosis present

## 2022-09-14 DIAGNOSIS — Z888 Allergy status to other drugs, medicaments and biological substances status: Secondary | ICD-10-CM | POA: Diagnosis not present

## 2022-09-14 DIAGNOSIS — Z79899 Other long term (current) drug therapy: Secondary | ICD-10-CM | POA: Diagnosis not present

## 2022-09-14 DIAGNOSIS — J1000 Influenza due to other identified influenza virus with unspecified type of pneumonia: Secondary | ICD-10-CM | POA: Diagnosis not present

## 2022-09-14 DIAGNOSIS — Z85828 Personal history of other malignant neoplasm of skin: Secondary | ICD-10-CM | POA: Diagnosis not present

## 2022-09-14 DIAGNOSIS — Z6826 Body mass index (BMI) 26.0-26.9, adult: Secondary | ICD-10-CM | POA: Diagnosis not present

## 2022-09-14 DIAGNOSIS — J189 Pneumonia, unspecified organism: Secondary | ICD-10-CM

## 2022-09-14 DIAGNOSIS — E1122 Type 2 diabetes mellitus with diabetic chronic kidney disease: Secondary | ICD-10-CM | POA: Diagnosis present

## 2022-09-14 DIAGNOSIS — M898X9 Other specified disorders of bone, unspecified site: Secondary | ICD-10-CM | POA: Diagnosis present

## 2022-09-14 DIAGNOSIS — J1001 Influenza due to other identified influenza virus with the same other identified influenza virus pneumonia: Secondary | ICD-10-CM | POA: Diagnosis present

## 2022-09-14 DIAGNOSIS — D631 Anemia in chronic kidney disease: Secondary | ICD-10-CM | POA: Diagnosis present

## 2022-09-14 DIAGNOSIS — Z86718 Personal history of other venous thrombosis and embolism: Secondary | ICD-10-CM | POA: Diagnosis not present

## 2022-09-14 DIAGNOSIS — I132 Hypertensive heart and chronic kidney disease with heart failure and with stage 5 chronic kidney disease, or end stage renal disease: Secondary | ICD-10-CM | POA: Diagnosis present

## 2022-09-14 DIAGNOSIS — Z882 Allergy status to sulfonamides status: Secondary | ICD-10-CM | POA: Diagnosis not present

## 2022-09-14 DIAGNOSIS — Z1152 Encounter for screening for COVID-19: Secondary | ICD-10-CM | POA: Diagnosis not present

## 2022-09-14 DIAGNOSIS — Z9851 Tubal ligation status: Secondary | ICD-10-CM | POA: Diagnosis not present

## 2022-09-14 DIAGNOSIS — J9601 Acute respiratory failure with hypoxia: Secondary | ICD-10-CM | POA: Diagnosis present

## 2022-09-14 DIAGNOSIS — N186 End stage renal disease: Secondary | ICD-10-CM | POA: Diagnosis present

## 2022-09-14 DIAGNOSIS — Z992 Dependence on renal dialysis: Secondary | ICD-10-CM | POA: Diagnosis not present

## 2022-09-14 DIAGNOSIS — I5032 Chronic diastolic (congestive) heart failure: Secondary | ICD-10-CM | POA: Diagnosis present

## 2022-09-14 DIAGNOSIS — Z79890 Hormone replacement therapy (postmenopausal): Secondary | ICD-10-CM | POA: Diagnosis not present

## 2022-09-14 DIAGNOSIS — E039 Hypothyroidism, unspecified: Secondary | ICD-10-CM | POA: Diagnosis present

## 2022-09-14 LAB — CBC
HCT: 26.5 % — ABNORMAL LOW (ref 36.0–46.0)
Hemoglobin: 7.8 g/dL — ABNORMAL LOW (ref 12.0–15.0)
MCH: 30.8 pg (ref 26.0–34.0)
MCHC: 29.4 g/dL — ABNORMAL LOW (ref 30.0–36.0)
MCV: 104.7 fL — ABNORMAL HIGH (ref 80.0–100.0)
Platelets: 173 10*3/uL (ref 150–400)
RBC: 2.53 MIL/uL — ABNORMAL LOW (ref 3.87–5.11)
RDW: 15.2 % (ref 11.5–15.5)
WBC: 3 10*3/uL — ABNORMAL LOW (ref 4.0–10.5)
nRBC: 0 % (ref 0.0–0.2)

## 2022-09-14 LAB — BASIC METABOLIC PANEL
Anion gap: 11 (ref 5–15)
BUN: 53 mg/dL — ABNORMAL HIGH (ref 8–23)
CO2: 19 mmol/L — ABNORMAL LOW (ref 22–32)
Calcium: 8.2 mg/dL — ABNORMAL LOW (ref 8.9–10.3)
Chloride: 107 mmol/L (ref 98–111)
Creatinine, Ser: 5.22 mg/dL — ABNORMAL HIGH (ref 0.44–1.00)
GFR, Estimated: 8 mL/min — ABNORMAL LOW (ref >60)
Glucose, Bld: 85 mg/dL (ref 70–99)
Potassium: 4.6 mmol/L (ref 3.5–5.1)
Sodium: 137 mmol/L (ref 135–145)

## 2022-09-14 MED ORDER — LIDOCAINE-PRILOCAINE 2.5-2.5 % EX CREA: 1.0000 | TOPICAL_CREAM | CUTANEOUS | Status: DC | PRN

## 2022-09-14 MED ORDER — ACETAMINOPHEN 325 MG PO TABS
650.0000 mg | ORAL_TABLET | Freq: Four times a day (QID) | ORAL | Status: DC | PRN
  Administered 2022-09-14: 650 mg via ORAL
  Filled 2022-09-14: qty 2

## 2022-09-14 MED ORDER — SALINE SPRAY 0.65 % NA SOLN
1.0000 | NASAL | Status: DC | PRN
  Filled 2022-09-14: qty 44

## 2022-09-14 MED ORDER — ONDANSETRON HCL 4 MG/2ML IJ SOLN
4.0000 mg | Freq: Once | INTRAMUSCULAR | Status: AC
  Administered 2022-09-14: 4 mg via INTRAVENOUS
  Filled 2022-09-14: qty 2

## 2022-09-14 MED ORDER — LIDOCAINE HCL (PF) 1 % IJ SOLN: 5.0000 mL | INTRAMUSCULAR | Status: DC | PRN

## 2022-09-14 MED ORDER — PENTAFLUOROPROP-TETRAFLUOROETH EX AERO: 1.0000 | INHALATION_SPRAY | CUTANEOUS | Status: DC | PRN

## 2022-09-14 NOTE — Progress Notes (Signed)
OT Cancellation Note  Patient Details Name: BEYZA BELLINO MRN: [id number] DOB: 10/24/1932   Cancelled Treatment:    Reason Eval/Treat Not Completed: Patient at procedure or test/ unavailable (Patient off unit in HD. Will attempt later today as schedule permits.) Lodema Hong, [place]  Office [phone]  Trixie Dredge 09/14/2022, 11:03 AM

## 2022-09-14 NOTE — Procedures (Signed)
Seen and examined on dialysis.  Blood pressure 151/61 and HR 69.  Right AVF in use.  Tolerating goal.  Procedure supervised.  We were able to get her cannulated.  HD today then on Saturday, 1/6.  Optimize resp status    Per HD SW her new outpatient HD unit is not finalized yet.   Claudia Desanctis, MD 09/14/2022  10:19 AM

## 2022-09-14 NOTE — Progress Notes (Signed)
Occupational Therapy Treatment Patient Details Name: Norma [facility] MRN: [id number] DOB: 09-Jun-1933 Today's Date: 09/14/2022   History of present illness 89 y.o. female presents to [facility] hospital on 09/12/2022 with weakness, vomiting, diarrhea and cough, found to be fluA positive. PMH includes ESRD, HTN, DMII, CHF.   OT comments  Patient complaining of fatigue and headache but wanting to use bathroom. Patient supervision for bed mobility and ambulating to bathroom with RW. Patient progressed with toilet hygiene with MI. Patient able to perform grooming standing at sink and asked to return to supine following. Patient is making good progress but is limited this session due to fatigue. Acute OT to continue to follow to address activity tolerance and safety with ADLs.    Recommendations for follow up therapy are one component of a multi-disciplinary discharge planning process, led by the attending physician.  Recommendations may be updated based on patient status, additional functional criteria and insurance authorization.    Follow Up Recommendations  Home health OT (vs no follow up, pending progress)     Assistance Recommended at Discharge Frequent or constant Supervision/Assistance  Patient can return home with the following  A little help with walking and/or transfers;A little help with bathing/dressing/bathroom   Equipment Recommendations  None recommended by OT    Recommendations for Other Services      Precautions / Restrictions Precautions Precautions: Fall Precaution Comments: Flu A Restrictions Weight Bearing Restrictions: No       Mobility Bed Mobility Overal bed mobility: Needs Assistance Bed Mobility: Supine to Sit, Sit to Supine     Supine to sit: Supervision Sit to supine: Supervision   General bed mobility comments: patient willing to get OOB for toileting    Transfers Overall transfer level: Needs assistance Equipment used: Rolling walker (2  wheels) Transfers: Sit to/from Stand, Bed to chair/wheelchair/BSC Sit to Stand: Supervision           General transfer comment: supervision with RW     Balance Overall balance assessment: Needs assistance Sitting-balance support: No upper extremity supported, Feet supported Sitting balance-Leahy Scale: Good     Standing balance support: No upper extremity supported, During functional activity Standing balance-Leahy Scale: Fair Standing balance comment: stood at sink for grooming tasks with no UE support                           ADL either performed or assessed with clinical judgement   ADL Overall ADL's : Needs assistance/impaired     Grooming: Wash/dry hands;Wash/dry face;Oral care;Brushing hair;Supervision/safety;Standing Grooming Details (indicate cue type and reason): at sink                 Toilet Transfer: Supervision/safety;Ambulation;Regular Toilet;Rolling walker (2 wheels) Toilet Transfer Details (indicate cue type and reason): used RW due to fatique Toileting- Clothing Manipulation and Hygiene: Modified independent;Sitting/lateral lean         General ADL Comments: patient limited due to fatigue    Extremity/Trunk Assessment              Vision       Perception     Praxis      Cognition Arousal/Alertness: Awake/alert Behavior During Therapy: WFL for tasks assessed/performed                                   General Comments: limited participation due to fatigue  Exercises      Shoulder Instructions       General Comments patient fatigue this session following HD. SpO2 on RA after ambulating to bathroom and back to EOB 83.  Instructed on Pursed lip breathing and 1 liter O2 patient able to increase to 93    Pertinent Vitals/ Pain       Pain Assessment Pain Assessment: 0-10 Pain Score: 4  Pain Location: headache Pain Descriptors / Indicators: Headache Pain Intervention(s): Premedicated before  session, Monitored during session  Home Living                                          Prior Functioning/Environment              Frequency  Min 2X/week        Progress Toward Goals  OT Goals(current goals can now be found in the care plan section)  Progress towards OT goals: Progressing toward goals  Acute Rehab OT Goals Patient Stated Goal: get better OT Goal Formulation: With patient Time For Goal Achievement: 09/26/22 Potential to Achieve Goals: Good ADL Goals Pt Will Perform Lower Body Bathing: with modified independence;sit to/from stand Pt Will Perform Lower Body Dressing: with modified independence;sit to/from stand Pt Will Transfer to Toilet: with modified independence;ambulating Pt Will Perform Toileting - Clothing Manipulation and hygiene: with modified independence;sitting/lateral leans  Plan Discharge plan remains appropriate    Co-evaluation                 AM-PAC OT "6 Clicks" Daily Activity     Outcome Measure   Help from another person eating meals?: None Help from another person taking care of personal grooming?: A Little Help from another person toileting, which includes using toliet, bedpan, or urinal?: A Little Help from another person bathing (including washing, rinsing, drying)?: A Little Help from another person to put on and taking off regular upper body clothing?: None Help from another person to put on and taking off regular lower body clothing?: A Little 6 Click Score: 20    End of Session Equipment Utilized During Treatment: Rolling walker (2 wheels)  OT Visit Diagnosis: Unsteadiness on feet (R26.81);Other abnormalities of gait and mobility (R26.89);Muscle weakness (generalized) (M62.81)   Activity Tolerance Patient tolerated treatment well;Patient limited by fatigue   Patient Left in bed;with call bell/phone within reach;with family/visitor present   Nurse Communication Mobility status        Time:  1359-1411 OT Time Calculation (min): 12 min  Charges: OT General Charges $OT Visit: 1 Visit OT Treatments $Self Care/Home Management : 8-22 mins  Lodema Hong, [place]  Office [place] 09/14/2022, 2:23 PM

## 2022-09-14 NOTE — Progress Notes (Addendum)
Case discussed with nephrologist. Contacted [facility] admissions and [facility] SW regarding update on pt's acceptance at clinic. Awaiting a response.   Melven Sartorius Renal Navigator [phone]  Addendum at 3:50 pm: Pt has been accepted at [facility] SW on MWF 11:45 am chair time. Pt can start on Monday and will need to arrive at 11:00 to complete paperwork prior to treatment. Spoke to pt's daughter via phone to provide this information. Will also provide schedule letter to daughter once it is available. Update provided to nephrologist. Will add appts to AVS.

## 2022-09-14 NOTE — Progress Notes (Signed)
[facility] Progress Note  Name: Norma [facility] MRN: [id number] DOB: 06-09-1933  Chief Complaint:  cough  Subjective:  Attempted first HD treatment yesterday and infiltrated.  She was given ice and treatment was post-poned to today.   She hasn't eaten yet today.  On 4 liters oxygen   Review of systems:   She reports some shortness of breath but that breathing is "ok"; no chest pain Denies n/v/d   ---------- Background on consult:  Norma [facility] is a 89 y.o. female with HTN, W0JW, diastolic heart failure.  She also is noted to have advanced CKD-  was being followed by a nephrologist in [place]-  getting prepared for dialysis -  had AVF placed.   Has recently moved here to [facility] to live with her daughter.  Saw Dr. Joylene Grapes at [facility]-  was c/o fatigue and a poor appetite, AVF was ready so decision was made to go ahead and start her on dialysis-  was to go to the TCU at [name] kidney center today even though had no intention of doing home dialysis.   Pt and daughter live in [place] so the plan would be to eventually go to [facility] for IHD.  Pt had been around ill contacts over the holidays-  over the last 4 days has felt more fatigued and started to develop a cough-  really not feeling like doing anything-  talked to [place] at the kidney center and was advised to come to the hospital- has tested positive for the flu - is being admitted for supportive care.  We are asked to see for dialysis needs.  Pt denies nausea, vomiting, itching or muscle cramps.  Numbers on blood work are not much worse than she has had in the past   No intake or output data in the 24 hours ending 09/14/22 0716  Vitals:  Vitals:   09/13/22 1203 09/13/22 1600 09/13/22 2136 09/14/22 0241  BP: (!) 145/61 139/60 131/61 (!) 158/62  Pulse: 65 66 62 69  Resp: '18 18 16 16  '$ Temp: 98.2 F (36.8 C) 98.1 F (36.7 C) 98.9 F (37.2 C) 98.2 F (36.8 C)  TempSrc: Oral Oral Oral Oral  SpO2: 97% 94%  95% 96%  Weight:      Height:         Physical Exam:    General adult female in bed in no acute distress HEENT normocephalic atraumatic extraocular movements intact sclera anicteric Neck supple trachea midline Lungs crackles and occ wheeze; normal work of breathing at rest on 4liters oxygen  Heart S1S2 no rub Abdomen soft nontender nondistended Extremities no edema  Psych normal mood and affect Neuro alert and oriented x3 provides history and follows commands Access LUE AVF with bruit and thrill   Medications reviewed   Labs:     Latest Ref Rng & Units 09/14/2022    2:25 AM 09/13/2022    2:17 AM 09/12/2022   11:04 AM  BMP  Glucose 70 - 99 mg/dL 85  80  105   BUN 8 - 23 mg/dL 53  51  51   Creatinine 0.44 - 1.00 mg/dL 5.22  4.91  4.87   Sodium 135 - 145 mmol/L 137  138  135   Potassium 3.5 - 5.1 mmol/L 4.6  5.1  4.9   Chloride 98 - 111 mmol/L 107  111  109   CO2 22 - 32 mmol/L '19  17  16   '$ Calcium 8.9 - 10.3 mg/dL 8.2  8.1  9.0      Assessment/Plan:   89 year old WF advanced CKD and plans to start dialysis as OP-  now has come down with the flu  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  Now is feeling more fatigued, likely a combination of flu and uremic sxms.   Rather than triage to TCU, she has been recommended for direct placement outpatient at longer term unit.  Discussed with HD SW  HD today (first treatment) then likely treatment #2 on Sat First shift today if able.  I have her NPO (and she didn't eat overnight) so that if we can't get her fistula accessed that we can call IR for a tunneled catheter today if needed  2. Hypertension/volume  - no overt overload and HTN controlled   3. Influenza A - per primary team.  I have requested that the patient have signage and appropriate isolation  4. Acute hypoxic resp failure - on 4 liters oxygen  - secondary to  influenza  4. Anemia of CKD  - Had been getting ESA and iron as OP in [place]-  iron sat on 12/13 was 29 with ferritin 111.  We are giving iron.  Aranesp 100 mcg weekly on Wednesdays while here and will need outpatient at her HD unit  4. Metabolic bone disease-  last phos was 4.8 and PTH 116-  not on activated vitamin D and no binder. Update phos in AM  Disposition - starting HD inpatient as above.  We are re-triaging her outpatient HD unit from the TCU if able.    Claudia Desanctis, MD 09/14/2022 7:39 AM

## 2022-09-14 NOTE — Progress Notes (Signed)
Physical Therapy Treatment Patient Details Name: Norma Coleman MRN: [id number] DOB: 10/13/1932 Today's Date: 09/14/2022   History of Present Illness 89 y.o. female presents to [facility] hospital on 09/12/2022 with weakness, vomiting, diarrhea and cough, found to be fluA positive. PMH includes ESRD, HTN, DMII, CHF.    PT Comments    Received pt semi-reclined in bed with daughter present at bedside. Pt with dry cough and reported not doing well due headache and fatigue from dialysis. Pt politely declined OOB mobility but agreed for therapist to assist with repositioning in bed for improved lung clearance. Flattened bed and pt able to scoot to [facility] with mod A using LEs to push in hooklying position and pulling on headboard with LUE. Briefly reviewed bed level leg exercises (SLR, ankle pumps, and hip abduction) to maintain strength while in bed. Acute PT to cont to follow.    Recommendations for follow up therapy are one component of a multi-disciplinary discharge planning process, led by the attending physician.  Recommendations may be updated based on patient status, additional functional criteria and insurance authorization.  Follow Up Recommendations  Home health PT     Assistance Recommended at Discharge Intermittent Supervision/Assistance  Patient can return home with the following A little help with walking and/or transfers;A little help with bathing/dressing/bathroom;Assistance with cooking/housework;Assist for transportation;Help with stairs or ramp for entrance   Equipment Recommendations  None recommended by PT    Recommendations for Other Services       Precautions / Restrictions Precautions Precautions: Fall Precaution Comments: Flu A Restrictions Weight Bearing Restrictions: No     Mobility  Bed Mobility               General bed mobility comments: pt politely declined OOB mobility Patient Response: Cooperative  Transfers                   General transfer  comment: pt politely declined OOB mobility    Ambulation/Gait               General Gait Details: pt politely declined OOB mobility   Stairs             Wheelchair Mobility    Modified Rankin (Stroke Patients Only)       Balance                                            Cognition Arousal/Alertness: Awake/alert Behavior During Therapy: WFL for tasks assessed/performed                                   General Comments: Pt politely refused OOB mobility, requesting to remain in bed. Pt with continued dry cough and therpaist suggested repositioning in bed for improved lung clearance.        Exercises      General Comments General comments (skin integrity, edema, etc.): Pt also reported fatigue from HD earlier today      Pertinent Vitals/Pain Pain Assessment Pain Assessment: 0-10 Pain Score: 8  Pain Location: headache Pain Descriptors / Indicators: Headache Pain Intervention(s): Limited activity within patient's tolerance, Repositioned, Patient requesting pain meds-RN notified    Home Living  Prior Function            PT Goals (current goals can now be found in the care plan section) Acute Rehab PT Goals Patient Stated Goal: to return to prior level of function PT Goal Formulation: With patient Time For Goal Achievement: 09/26/22 Potential to Achieve Goals: Good    Frequency    Min 3X/week      PT Plan Current plan remains appropriate    Co-evaluation              AM-PAC PT "6 Clicks" Mobility   Outcome Measure                   End of Session   Activity Tolerance: Patient limited by lethargy;Patient limited by pain Patient left: in bed;with family/visitor present Nurse Communication: Mobility status PT Visit Diagnosis: Other abnormalities of gait and mobility (R26.89);Muscle weakness (generalized) (M62.81);History of falling (Z91.81)     Time:  1320-1330 PT Time Calculation (min) (ACUTE ONLY): 10 min  Charges:  $Therapeutic Activity: 8-22 mins                     Becky Sax PT, DPT  Blenda Nicely 09/14/2022, 1:37 PM

## 2022-09-14 NOTE — Procedures (Signed)
HD Note:  Some information was entered later than the data was gathered due to patient care needs. The stated time with the data is accurate.   Received patient in bed to unit.  Alert and oriented.  Informed consent signed and in chart.    Patient tolerated well. No coughing noted,  See FlowSheet  Transported back to the room  Alert, without acute distress.  Hand-off given to patient's nurse.   Access used: Left upper arm AVF Access issues: No issues  The patient was kept even with fluid successfully.    [facility]

## 2022-09-14 NOTE — Progress Notes (Signed)
Subjective:   Summary: Norma Coleman is a 89 y.o. year old female currently admitted on the IMTS HD#1 for influenza related pneumonia.  Overnight Events: NOE   Pt was seen at dialysis this morning. She still endorses a cough, but she states last night it was well controlled with the tesselon pearls. She was not able to get her medications this morning due to dialysis. She states she gets dyspneic only when she coughs, and has used the oxygen intermittently. Seems to be tolerating dialysis well.    Objective:  Vital signs in last 24 hours: Vitals:   09/14/22 1000 09/14/22 1030 09/14/22 1100 09/14/22 1131  BP: (!) 146/67 (!) 141/58 (!) 138/56 (!) 136/45  Pulse:    64  Resp:    16  Temp:      TempSrc:      SpO2:    97%  Weight:      Height:       Supplemental O2: 4L [place] 100%   Physical Exam:  Constitutional: Pleasant, elderly female, receiving dialysis occasional cough, in no acute distress Cardiovascular: RRR, no murmurs, rubs or gallops Pulmonary/Chest: Bilateral crackles and wheezes heard throughout lung field, improved  Abdominal: soft, non-tender, non-distended Skin: warm and dry, hematoma present at fistula site   [facility] Weights   09/12/22 0937 09/14/22 0922  Weight: 68 kg 68.3 kg    No intake or output data in the 24 hours ending 09/14/22 1133 Net IO Since Admission: No IO data has been entered for this period [09/14/22 1133]  Pertinent Labs:    Latest Ref Rng & Units 09/14/2022    2:25 AM 09/13/2022    2:17 AM 09/12/2022   11:04 AM  CBC  WBC 4.0 - 10.5 K/uL 3.0  3.3  4.3   Hemoglobin 12.0 - 15.0 g/dL 7.8  8.0  9.4   Hematocrit 36.0 - 46.0 % 26.5  26.7  30.7   Platelets 150 - 400 K/uL 173  151  193        Latest Ref Rng & Units 09/14/2022    2:25 AM 09/13/2022    2:17 AM 09/12/2022   11:04 AM  CMP  Glucose 70 - 99 mg/dL 85  80  105   BUN 8 - 23 mg/dL 53  51  51   Creatinine 0.44 - 1.00 mg/dL 5.22  4.91  4.87   Sodium 135 - 145 mmol/L 137   138  135   Potassium 3.5 - 5.1 mmol/L 4.6  5.1  4.9   Chloride 98 - 111 mmol/L 107  111  109   CO2 22 - 32 mmol/L '19  17  16   '$ Calcium 8.9 - 10.3 mg/dL 8.2  8.1  9.0   Total Protein 6.5 - 8.1 g/dL  5.7    Total Bilirubin 0.3 - 1.2 mg/dL  0.4    Alkaline Phos 38 - 126 U/L  133    AST 15 - 41 U/L  22    ALT 0 - 44 U/L  15        Assessment/Plan:   Principal Problem:   Influenza A Active Problems:   ESRD (end stage renal disease) (HCC)   Patient Summary: Norma Coleman is a 89 y.o. with a pertinent PMH of end-stage renal disease, hypertension, type II diabetes mellitus, chronic diastolic heart failure, who presented with weakness and cough and admitted for influenza  A pneumonia.    #Influenza A Pneumonia Pt continues to intermittently require around 4L of [place]. She states she only gets dyspneic and requires it when she starts having coughing fits. She does not use oxygen at baseline. Initial chest x-ray showed right lower lung airspace disease/consolidation significant for influenza pneumonia. Repeat chest x-ray showed no progression of pneumonia. She continues to have a non-productive cough, likely secondary to the influenza infection.  Plan:  - Continue dextromethorphan - Continue tessalon perles for cough  - Monitor vitals  #ESRD on HD  Pt has had vascular access site since 8/31. Pt seemed to be tolerating dialysis well, besides hematoma present when attempting to secure fistula site. This is pt's first dialysis session, she has been set up for home dialysis sessions, however those are four times a week. Nephrology is currently working with [facility] to schedule outpatient follow up three times a day.    Plan:  - Appreciate nephrology recommendations  - Next HD session on Saturday  - Will follow up and see if outpatient dialysis is set up at [facility]  #Chronic Diastolic Heart Failure  #Hypertension Pt seen today at dialysis, and did seem euvolemic on exam. Systolic  blood pressure has ranged from  140-150.  Will continue amlodipine '10mg'$  daily, carvedilol 3.'125mg'$  BID, and Hydralazine '50mg'$  TID.    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 Will continue to monitor Hb with CBC, and continue Aranesp injections.    #Hyperlipidemia Atorvastatin '40mg'$  daily  #Hypothyroidism  Levothyroxine 58mg daily  Diet: Renal IVF: None,None VTE: Heparin Code: Full    Dispo: Anticipated discharge to Home in 3 days pending medical management. .Drucie Opitz MD PGY-1 Internal Medicine Resident Pager Number [phone] contact the on call pager after 5 pm and on weekends at [phone]

## 2022-09-15 DIAGNOSIS — E440 Moderate protein-calorie malnutrition: Secondary | ICD-10-CM | POA: Insufficient documentation

## 2022-09-15 DIAGNOSIS — N186 End stage renal disease: Secondary | ICD-10-CM | POA: Diagnosis not present

## 2022-09-15 DIAGNOSIS — Z992 Dependence on renal dialysis: Secondary | ICD-10-CM

## 2022-09-15 DIAGNOSIS — J1000 Influenza due to other identified influenza virus with unspecified type of pneumonia: Secondary | ICD-10-CM

## 2022-09-15 LAB — CBC
HCT: 26.8 % — ABNORMAL LOW (ref 36.0–46.0)
Hemoglobin: 8.4 g/dL — ABNORMAL LOW (ref 12.0–15.0)
MCH: 31.2 pg (ref 26.0–34.0)
MCHC: 31.3 g/dL (ref 30.0–36.0)
MCV: 99.6 fL (ref 80.0–100.0)
Platelets: 159 10*3/uL (ref 150–400)
RBC: 2.69 MIL/uL — ABNORMAL LOW (ref 3.87–5.11)
RDW: 14.8 % (ref 11.5–15.5)
WBC: 3.4 10*3/uL — ABNORMAL LOW (ref 4.0–10.5)
nRBC: 0 % (ref 0.0–0.2)

## 2022-09-15 LAB — BASIC METABOLIC PANEL
Anion gap: 13 (ref 5–15)
BUN: 34 mg/dL — ABNORMAL HIGH (ref 8–23)
CO2: 22 mmol/L (ref 22–32)
Calcium: 8 mg/dL — ABNORMAL LOW (ref 8.9–10.3)
Chloride: 101 mmol/L (ref 98–111)
Creatinine, Ser: 3.65 mg/dL — ABNORMAL HIGH (ref 0.44–1.00)
GFR, Estimated: 12 mL/min — ABNORMAL LOW (ref >60)
Glucose, Bld: 99 mg/dL (ref 70–99)
Potassium: 4.1 mmol/L (ref 3.5–5.1)
Sodium: 136 mmol/L (ref 135–145)

## 2022-09-15 LAB — GLUCOSE, CAPILLARY: Glucose-Capillary: 99 mg/dL (ref 70–99)

## 2022-09-15 LAB — PHOSPHORUS: Phosphorus: 4.3 mg/dL (ref 2.5–4.6)

## 2022-09-15 MED ORDER — RENA-VITE PO TABS
1.0000 | ORAL_TABLET | Freq: Every day | ORAL | Status: DC
  Administered 2022-09-15 – 2022-09-19 (×5): 1 via ORAL
  Filled 2022-09-15 (×5): qty 1

## 2022-09-15 MED ORDER — CHLORHEXIDINE GLUCONATE CLOTH 2 % EX PADS
6.0000 | MEDICATED_PAD | Freq: Every day | CUTANEOUS | Status: DC
  Administered 2022-09-16 – 2022-09-19 (×4): 6 via TOPICAL

## 2022-09-15 MED ORDER — NEPRO/CARBSTEADY PO LIQD
237.0000 mL | Freq: Two times a day (BID) | ORAL | Status: DC
  Administered 2022-09-15 – 2022-09-16 (×2): 237 mL via ORAL

## 2022-09-15 MED ORDER — OXYMETAZOLINE HCL 0.05 % NA SOLN
1.0000 | Freq: Two times a day (BID) | NASAL | Status: AC
  Administered 2022-09-15 – 2022-09-17 (×6): 1 via NASAL
  Filled 2022-09-15: qty 30

## 2022-09-15 MED ORDER — IPRATROPIUM-ALBUTEROL 0.5-2.5 (3) MG/3ML IN SOLN
3.0000 mL | Freq: Four times a day (QID) | RESPIRATORY_TRACT | Status: DC | PRN
  Administered 2022-09-15 – 2022-09-17 (×3): 3 mL via RESPIRATORY_TRACT
  Filled 2022-09-15 (×3): qty 3

## 2022-09-15 MED ORDER — ONDANSETRON HCL 4 MG/2ML IJ SOLN
4.0000 mg | Freq: Four times a day (QID) | INTRAMUSCULAR | Status: DC | PRN
  Administered 2022-09-15 – 2022-09-20 (×2): 4 mg via INTRAVENOUS
  Filled 2022-09-15 (×4): qty 2

## 2022-09-15 NOTE — Progress Notes (Signed)
PT Cancellation Note  Patient Details Name: Norma Coleman MRN: [id number] DOB: 27-May-1933   Cancelled Treatment:    Reason Eval/Treat Not Completed: Other (comment)  Pt politely declined PT due to not feeling well.  When encouraged that it is important to be up and OOB , particularly with PNE, pt reports she has been up to bathroom several times.  Therapy verbalized that was good and can follow up later time since pt not feeling well. Abran Richard, PT Acute Rehab [facility] [phone]  Karlton Lemon 09/15/2022, 5:00 PM

## 2022-09-15 NOTE — Progress Notes (Addendum)
[facility] Progress Note  Name: Norma Coleman MRN: [id number] DOB: October 26, 1932  Chief Complaint:  cough  Subjective:  She was able to get HD yesterday via AVF.  She was kept even.  Yesterday she was accepted to [facility]) on MWF 11:45 am chair time. Pt can start there on Monday, 1/8.  Strict ins/outs not available.  One unmeasured urine void over 1/4.  She states that she had a rough night.  Her oxygen was listed at 2 liters but was off (on floor) on my arrival this am   Review of systems:   Coughing and short of breath  Didn't sleep well 2/2 cough  Nausea overnight - she attributes to the cough    ---------- Background on consult:  Norma Coleman is a 89 y.o. female with HTN, K2HC, diastolic heart failure.  She also is noted to have advanced CKD-  was being followed by a nephrologist in [place]-  getting prepared for dialysis -  had AVF placed.   Has recently moved here to [facility] to live with her daughter.  Saw Dr. Joylene Grapes at [facility]-  was c/o fatigue and a poor appetite, AVF was ready so decision was made to go ahead and start her on dialysis-  was to go to the TCU at [facility] kidney center today even though had no intention of doing home dialysis.   Pt and daughter live in [place] so the plan would be to eventually go to [facility] for IHD.  Pt had been around ill contacts over the holidays-  over the last 4 days has felt more fatigued and started to develop a cough-  really not feeling like doing anything-  talked to [place] at the kidney center and was advised to come to the hospital- has tested positive for the flu - is being admitted for supportive care.  We are asked to see for dialysis needs.  Pt denies nausea, vomiting, itching or muscle cramps.  Numbers on blood work are not much worse than she has had in the past    Intake/Output Summary (Last 24 hours) at 09/15/2022 0646 Last data filed at 09/15/2022 0600 Gross per 24 hour  Intake 110 ml  Output  0 ml  Net 110 ml    Vitals:  Vitals:   09/14/22 1152 09/14/22 1550 09/14/22 2010 09/15/22 0525  BP: (!) 156/68 139/78 (!) 150/61 (!) 158/66  Pulse: 72 73 69 90  Resp: '20 18 18 16  '$ Temp:  98.5 F (36.9 C) 98.6 F (37 C) 98.2 F (36.8 C)  TempSrc:  Oral Oral Oral  SpO2: 95% 95% 96% 100%  Weight:      Height:         Physical Exam:     General adult female in bed in no acute distress HEENT normocephalic atraumatic extraocular movements intact sclera anicteric Neck supple trachea midline Lungs crackles and occ cough; normal work of breathing at rest on room air on my arrival (oxygen is flowing but cannula on floor) Heart S1S2 no rub Abdomen soft nontender nondistended Extremities no edema  Psych normal mood and affect Neuro alert and oriented x3 provides history and follows commands Access LUE AVF with bruit and thrill; hematoma noted   Medications reviewed   Labs:     Latest Ref Rng & Units 09/15/2022    4:30 AM 09/14/2022    2:25 AM 09/13/2022    2:17 AM  BMP  Glucose 70 - 99 mg/dL 99  85  80   BUN 8 - 23 mg/dL 34  53  51   Creatinine 0.44 - 1.00 mg/dL 3.65  5.22  4.91   Sodium 135 - 145 mmol/L 136  137  138   Potassium 3.5 - 5.1 mmol/L 4.1  4.6  5.1   Chloride 98 - 111 mmol/L 101  107  111   CO2 22 - 32 mmol/L '22  19  17   '$ Calcium 8.9 - 10.3 mg/dL 8.0  8.2  8.1      Assessment/Plan:   89 year old WF advanced CKD and plans to start dialysis as OP-  now has come down with the flu  CKD stage V with 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  Now is feeling more fatigued, likely a combination of flu and uremic sxms.   She has been accepted to [facility] ([facility] unit) for MWF seat and can start there on Monday, 1/8 Next HD here 1/6 then stable for discharge from a strictly renal standpoint  Changed to renal diet   2. Hypertension/volume  - no overt overload and HTN  controlled   3. Influenza A - per primary team.    4. Acute hypoxic resp failure - oxygen requirement improving - from 4 to 2 liters  - secondary to influenza  4. Anemia of CKD  - Had been getting ESA and iron as OP in [place]-  iron sat on 12/13 was 29 with ferritin 111.  We are giving iron.  Aranesp 100 mcg weekly on Wednesdays (given 1/3) while here and will need ESA outpatient at her HD unit  4. Metabolic bone disease-  phos acceptable not on binder; PTH 116-  not on activated vitamin D  Disposition - Next HD here 1/6 then stable for discharge from a strictly renal standpoint   Claudia Desanctis, MD 09/15/2022 7:06 AM

## 2022-09-15 NOTE — Hospital Course (Signed)
1/8 Eating ok, not good. Urinating without problems. No nausea, vomiting. She has diarrhea intermittently. Walking to bathroom without problems, no SOB. Cough and congestion present.   1/6 Cough better than yesterday. Getting up and walking around this morning. Received breathing treatment and it is helping. She was wheezing. It has improved. Saw kidney doctors today. No history of smoking or lung problems. Slept well.  1/5 Feels really sick. Throwing up. Not hungry. Says she doesn't know how breathing is doing. Congested from both ends. Says she probably needs the oxygen. Feeling bad all night.

## 2022-09-15 NOTE — Progress Notes (Signed)
Initial Nutrition Assessment  DOCUMENTATION CODES:   Non-severe (moderate) malnutrition in context of chronic illness  INTERVENTION:  - Liberalize to Reg diet  - Add Nepro Shake po BID, each supplement provides 425 kcal and 19 grams protein   - Add Renal MVI   NUTRITION DIAGNOSIS:   Moderate Malnutrition related to chronic illness as evidenced by energy intake < or equal to 75% for > or equal to 1 month, percent weight loss.  GOAL:   Patient will meet greater than or equal to 90% of their needs  MONITOR:   PO intake, Supplement acceptance  REASON FOR ASSESSMENT:   Consult Diet education  ASSESSMENT:   89 y.o. female admits related to weakness and cough. PMH includes: ESRD, HTN, T2DM, CHF. Pt is currently receiving medical management related to influenza A.  Meds reviewed: lipitor, synthroid. Labs reviewed.   The pt was sleeping at time of assessment. Daughter was present at bedside. Daughter reports that her mother has not been eating well since Thanksgiving. Per record, pt has experienced an 8% wt loss in one month. The pt is not meeting her needs. RD will modify to Regular diet to allow pt to have more options. Will also add Nepro BID for now. Will continue to monitor PO intakes  NUTRITION - FOCUSED PHYSICAL EXAM:  Unable to assess at this time, attempt at f/u.   Diet Order:   Diet Order             Diet regular Room service appropriate? Yes; Fluid consistency: Thin; Fluid restriction: 1500 mL Fluid  Diet effective now                   EDUCATION NEEDS:   Not appropriate for education at this time  Skin:  Skin Assessment: Reviewed RN Assessment  Last BM:  09/14/22  Height:   Ht Readings from Last 1 Encounters:  09/12/22 '5\' 3"'$  (1.6 m)    Weight:   Wt Readings from Last 1 Encounters:  09/14/22 68.3 kg    Ideal Body Weight:     BMI:  Body mass index is 26.67 kg/m.  Estimated Nutritional Needs:   Kcal:  1705-2050 kcals  Protein:   85-100  Fluid:  >/= 1.7  Ellamay Fors Graciela Husbands, RD, LDN, CNSC.

## 2022-09-15 NOTE — Progress Notes (Addendum)
Subjective:   Summary: Norma Coleman is a 89 y.o. year old female currently admitted on the IMTS HD#3 for influenza related pneumonia.  Overnight Events: NOE   Pt was seen at bedside this morning. She states that she feels really sick with nausea and vomiting. She does not have an appetite. And is congested from both ends of her nose. Feels like she needs the oxygen, and has been feeling bad all night.    Objective:  Vital signs in last 24 hours: Vitals:   09/14/22 2010 09/15/22 0525 09/15/22 0824 09/15/22 1100  BP: (!) 150/61 (!) 158/66 (!) 172/70 (!) 131/51  Pulse: 69 90 76   Resp: '18 16 15   '$ Temp: 98.6 F (37 C) 98.2 F (36.8 C) 98.3 F (36.8 C)   TempSrc: Oral Oral Oral   SpO2: 96% 100% 95%   Weight:      Height:       Supplemental O2: 4L [place] 100%   Physical Exam:  Constitutional: Ill appearing, elderly female,  occasional cough, in no acute distress Cardiovascular: RRR, no murmurs, rubs or gallops Pulmonary/Chest: Bilateral crackles and wheezes heard throughout lung field Abdominal: soft, non-tender, non-distended Skin: warm and dry, hematoma present at fistula site, thrill palpated   Filed Weights   09/12/22 0937 09/14/22 0922  Weight: 68 kg 68.3 kg     Intake/Output Summary (Last 24 hours) at 09/15/2022 1526 Last data filed at 09/15/2022 1407 Gross per 24 hour  Intake 410 ml  Output 150 ml  Net 260 ml   Net IO Since Admission: 260 mL [09/15/22 1526]  Pertinent Labs:    Latest Ref Rng & Units 09/15/2022    4:30 AM 09/14/2022    2:25 AM 09/13/2022    2:17 AM  CBC  WBC 4.0 - 10.5 K/uL 3.4  3.0  3.3   Hemoglobin 12.0 - 15.0 g/dL 8.4  7.8  8.0   Hematocrit 36.0 - 46.0 % 26.8  26.5  26.7   Platelets 150 - 400 K/uL 159  173  151        Latest Ref Rng & Units 09/15/2022    4:30 AM 09/14/2022    2:25 AM 09/13/2022    2:17 AM  CMP  Glucose 70 - 99 mg/dL 99  85  80   BUN 8 - 23 mg/dL 34  53  51   Creatinine 0.44 - 1.00 mg/dL 3.65  5.22   4.91   Sodium 135 - 145 mmol/L 136  137  138   Potassium 3.5 - 5.1 mmol/L 4.1  4.6  5.1   Chloride 98 - 111 mmol/L 101  107  111   CO2 22 - 32 mmol/L '22  19  17   '$ Calcium 8.9 - 10.3 mg/dL 8.0  8.2  8.1   Total Protein 6.5 - 8.1 g/dL   5.7   Total Bilirubin 0.3 - 1.2 mg/dL   0.4   Alkaline Phos 38 - 126 U/L   133   AST 15 - 41 U/L   22   ALT 0 - 44 U/L   15       Assessment/Plan:   Principal Problem:   Influenza A Active Problems:   ESRD (end stage renal disease) (HCC)   Patient Summary: Norma Coleman is a 89 y.o. with a pertinent PMH of end-stage renal disease, hypertension, type II diabetes mellitus, chronic diastolic  heart failure, who presented with weakness and cough and admitted for influenza A pneumonia.    #Influenza A Pneumonia Pt continues to intermittently require around 4L of [place], when at home she does not use any. She is dyspneic when she coughs, and today she states that she feels a lot worse, likely secondary to the dialysis. She continues to have a non-productive cough, likely secondary to the influenza infection. Lung auscultation revealed rhonchi and some wheezes, likely in the setting of influenza pneumonia. Will continue with supportive care, and due to pt's co morbidities and age may take a longer time for pt to heal.  Plan:  - Continue dextromethorphan - Using Afrin as a decongestant - Continue tessalon perles for cough  - Monitor vitals  #ESRD on HD  Pt has had vascular access site since 8/31. After pt's first dialysis session yesterday, she is not feeling very well. She has had nausea, vomiting, and general profound weakness. Pt seemed to be tolerating dialysis well, besides hematoma present when attempting to secure fistula site. This is pt's first dialysis session, she has been set up for home dialysis sessions, however those are four times a week. Nephrology is currently working with [facility] to schedule outpatient follow up three times a day.  Pt  has follow up per renal navigator for outpatient dialysis.   Plan:  - Appreciate nephrology recommendations  - Next HD session on Saturday  - Will follow up and see if outpatient dialysis is set up at [name]  #Chronic Diastolic Heart Failure  #Hypertension Pt seen today at dialysis, and did seem euvolemic on exam. Systolic blood pressure has ranged from  140-150.  Will continue amlodipine '10mg'$  daily, carvedilol 3.'125mg'$  BID, and Hydralazine '50mg'$  TID.    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 Will continue to monitor Hb with CBC, and continue Aranesp injections.    #Hyperlipidemia Atorvastatin '40mg'$  daily  #Hypothyroidism  Levothyroxine 46mg daily  Diet: Renal IVF: None,None VTE: Heparin Code: Full    Dispo: Anticipated discharge to Home in 3 days pending medical management. .Drucie Opitz MD PGY-1 Internal Medicine Resident Pager Number [phone] contact the on call pager after 5 pm and on weekends at [phone]

## 2022-09-15 NOTE — Progress Notes (Incomplete)
Initial Nutrition Assessment  DOCUMENTATION CODES:      INTERVENTION:  ***   NUTRITION DIAGNOSIS:     related to   as evidenced by  .  ***  GOAL:      ***  MONITOR:      REASON FOR ASSESSMENT:   Consult Diet education  ASSESSMENT:   89 y.o. female admits related to weakness and cough. PMH includes: ESRD, HTN, T2DM, CHF. Pt is currently receiving medical management related to influenza A.  ***   NUTRITION - FOCUSED PHYSICAL EXAM:  {RD Focused Exam List:21252}  Diet Order:   Diet Order             Diet regular Room service appropriate? Yes; Fluid consistency: Thin; Fluid restriction: 1500 mL Fluid  Diet effective now                   EDUCATION NEEDS:      Skin:     Last BM:     Height:   Ht Readings from Last 1 Encounters:  09/12/22 '5\' 3"'$  (1.6 m)    Weight:   Wt Readings from Last 1 Encounters:  09/14/22 68.3 kg    Ideal Body Weight:     BMI:  Body mass index is 26.67 kg/m.  Estimated Nutritional Needs:   Kcal:     Protein:     Fluid:     Thalia Bloodgood, RD, LDN, CNSC.

## 2022-09-15 NOTE — Progress Notes (Signed)
OT Cancellation Note  Patient Details Name: Norma Coleman MRN: [id number] DOB: 03-21-1933   Cancelled Treatment:    Reason Eval/Treat Not Completed: Patient declined, no reason specified (Patient states she is not feeling well and want to rest. Will attempt later today as schedule permits) Lodema Hong, Short 09/15/2022, 10:17 AM

## 2022-09-15 NOTE — Progress Notes (Signed)
Schedule letter sent to pt's daughter via text this morning with details regarding out-pt HD at d/c. Spoke to daughter via phone as well. Daughter aware that if pt is d/c over the weekend then pt will need to go to the clinic on Monday at 11:00 to complete paperwork prior to 11:45 chair time. Daughter voices understanding. Details on AVS as well. Contacted renal PA regarding clinic's need for orders if pt is d/c over the weekend as well. Clinic advised pt for possible d/c this weekend with start date of Monday as planned.   Melven Sartorius Renal Navigator [phone]

## 2022-09-16 LAB — CBC
HCT: 26.9 % — ABNORMAL LOW (ref 36.0–46.0)
Hemoglobin: 8.2 g/dL — ABNORMAL LOW (ref 12.0–15.0)
MCH: 31.1 pg (ref 26.0–34.0)
MCHC: 30.5 g/dL (ref 30.0–36.0)
MCV: 101.9 fL — ABNORMAL HIGH (ref 80.0–100.0)
Platelets: 194 10*3/uL (ref 150–400)
RBC: 2.64 MIL/uL — ABNORMAL LOW (ref 3.87–5.11)
RDW: 14.9 % (ref 11.5–15.5)
WBC: 3.1 10*3/uL — ABNORMAL LOW (ref 4.0–10.5)
nRBC: 0 % (ref 0.0–0.2)

## 2022-09-16 LAB — BASIC METABOLIC PANEL
Anion gap: 13 (ref 5–15)
BUN: 37 mg/dL — ABNORMAL HIGH (ref 8–23)
CO2: 21 mmol/L — ABNORMAL LOW (ref 22–32)
Calcium: 8.2 mg/dL — ABNORMAL LOW (ref 8.9–10.3)
Chloride: 102 mmol/L (ref 98–111)
Creatinine, Ser: 4.34 mg/dL — ABNORMAL HIGH (ref 0.44–1.00)
GFR, Estimated: 9 mL/min — ABNORMAL LOW (ref >60)
Glucose, Bld: 116 mg/dL — ABNORMAL HIGH (ref 70–99)
Potassium: 4 mmol/L (ref 3.5–5.1)
Sodium: 136 mmol/L (ref 135–145)

## 2022-09-16 NOTE — Discharge Summary (Incomplete)
Name: Norma Coleman MRN: [id number] DOB: 07/19/1933 89 y.o. PCP: Patient, No Pcp Per  Date of Admission: 09/12/2022  9:27 AM Date of Discharge: 09/16/2022 Attending Physician: Velna Ochs, MD  Discharge Diagnosis: 1. Principal Problem:   Influenza A Active Problems:   ESRD (end stage renal disease) (HCC)   Malnutrition of moderate degree  ***  Discharge Medications: Allergies as of 09/16/2022       Reactions   Ibuprofen Other (See Comments)   Stomach bleed   Sulfa Antibiotics      Med Rec must be completed prior to using this [place]***       Disposition and follow-up:   NormaNorma Coleman was discharged from [facility] in Good condition.  At the hospital follow up visit please address:  1.  ***  2.  Labs / imaging needed at time of follow-up: BMP and CBC  3.  Pending labs/ test needing follow-up: N/A  Follow-up Appointments:  [facility]. Go on 09/18/2022.   Why: Schedule is Monday/Wednesday/Friday with 11:45 chair time.  On Monday, please arrive at 11:00 to complete paperwork prior to treatment. Contact information: [place] [address] [phone]                 Hospital Course by problem list:  #Influenza A Pneumonia Pt initially presented to the emergency department with 4-5 days of profound weakness, cough, vomiting, and diarrhea. She was found to be flu positive, with X-Ray findings positive for influenza related pneumonia. Pt initially required 4L of supplemental oxygen, however has been on room air intermittently, most recently requiring 2L of oxygen when she has coughing spells. This was treated symptomatically, with dextromethorphan and benzotate as cough suppressants, and afrin being used for decongestants.   #ESRD on HD Pt was in the middle of arranging outpatient dialysis sessions for her end stage renal disease before becoming ill with influenza  infection. She has had her fistula placed since 8/31. Initially, when taken to dialysis here, line was infiltrated and had to wait until the next day. Dialysis was done the following day for the first time. Pt felt ill and nauseous the next day, secondary to dialysis effects. Pt is cleared for outpatient dialysis at [facility] starting on Monday 09/18/2022.   #Chronic Diastolic Heart Failure  #Hypertension  Pt was kept on her home regimen of amlodipine '10mg'$  daily, carvedilol 3.'125mg'$  BID, and Hydralazine '50mg'$  TID  #Anemia of Chronic Disease 2/2 ESRD  Pt has known anemia of chronic disease in the setting of ESRD. Hb remained stable, and Aranesp injections were continued.   #Hyperlipidemia  Continued Atorvastatin '40mg'$  daily   #Hypothyroidism Continued levothyroxine 34mg daily   Discharge Exam:   BP (!) 146/64 (BP Location: Right Arm)   Pulse 89   Temp 97.9 F (36.6 C) (Oral)   Resp 18   Ht '5\' 3"'$  (1.6 m)   Wt 68.3 kg   SpO2 96%   BMI 26.67 kg/m  Discharge exam:   Constitutional: Elderly female,  occasional cough, in no acute distress Cardiovascular: RRR, no murmurs, rubs or gallops Pulmonary/Chest: Bilateral crackles and wheezes heard throughout lung field Abdominal: soft, non-tender, non-distended Skin: warm and dry, hematoma present at fistula site, thrill palpated  Pertinent Labs, Studies, and Procedures:     Latest Ref Rng & Units 09/16/2022    7:23 AM 09/15/2022    4:30 AM 09/14/2022    2:25 AM  BMP  Glucose 70 - 99 mg/dL 116  99  85   BUN 8 - 23 mg/dL 37  34  53   Creatinine 0.44 - 1.00 mg/dL 4.34  3.65  5.22   Sodium 135 - 145 mmol/L 136  136  137   Potassium 3.5 - 5.1 mmol/L 4.0  4.1  4.6   Chloride 98 - 111 mmol/L 102  101  107   CO2 22 - 32 mmol/L '21  22  19   '$ Calcium 8.9 - 10.3 mg/dL 8.2  8.0  8.2        Latest Ref Rng & Units 09/16/2022    7:23 AM 09/15/2022    4:30 AM 09/14/2022    2:25 AM  CBC  WBC 4.0 - 10.5 K/uL 3.1  3.4  3.0   Hemoglobin 12.0 - 15.0 g/dL 8.2   8.4  7.8   Hematocrit 36.0 - 46.0 % 26.9  26.8  26.5   Platelets 150 - 400 K/uL 194  159  173      Discharge Instructions:   Signed: Drucie Opitz, MD 09/16/2022, 12:40 PM   Pager: [phone]

## 2022-09-16 NOTE — Progress Notes (Signed)
Mobility Specialist Progress Note:   09/16/22 1146  Mobility  Activity Ambulated with assistance in room  Level of Assistance Standby assist, set-up cues, supervision of patient - no hands on  Assistive Device Front wheel walker  Distance Ambulated (ft) 200 ft  Activity Response Tolerated well  Mobility Referral Yes  $Mobility charge 1 Mobility   Pt received in bed and agreeable. No complaints. Independent with pericare. Pt left in bed with all needs met and call bell in reach.   Andrey Campanile Mobility Specialist Please contact via SecureChat or  Rehab office at [phone]

## 2022-09-16 NOTE — Progress Notes (Signed)
[facility] Progress Note  Chief Complaint:  cough  Assessment/Plan:   89 year old WF advanced CKD and plans to start dialysis as OP-  now has come down with the flu  CKD stage V with 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  Now is feeling more fatigued, likely a combination of flu and uremic sxms.   She has been accepted to [facility] ([facility] unit) for MWF seat and can start there on Monday, 1/8 Next HD here 1/6 then stable for discharge from a strictly renal standpoint  Changed to renal diet   2. Hypertension/volume  - no overt overload and HTN controlled   3. Influenza A - per primary team.    4. Acute hypoxic resp failure - oxygen requirement improving - from 4 to 2 liters, still with consistent cough  - secondary to influenza  4. Anemia of CKD  - Had been getting ESA and iron as OP in [place]-  iron sat on 12/13 was 29 with ferritin 111.  We are giving iron.  Aranesp 100 mcg weekly on Wednesdays (given 1/3) while here and will need ESA outpatient at her HD unit  4. Metabolic bone disease-  phos acceptable not on binder; PTH 116-  not on activated vitamin D  Disposition - Next HD here 1/6 then stable for discharge from a strictly renal standpoint   Subjective:  She was able to get HD 1/4 via AVF.  She was kept even.  Accepted to [facility]) on MWF 11:45 am chair time. Pt can start there on Monday, 1/8.  Strict ins/outs not available.  One unmeasured urine void over 1/4.  Coughing consistently with a little intermittent nausea.  Review of systems:   Coughing and short of breath  Didn't sleep well 2/2 cough  Nausea overnight - she attributes to the cough    ---------- Background on consult:  Norma Coleman is a 89 y.o. female with HTN, W4XL, diastolic heart failure.  She also is noted to have advanced CKD-  was being followed by a nephrologist  in [place]-  getting prepared for dialysis -  had AVF placed.   Has recently moved here to [facility] [place] [facility] to live with her daughter.  Saw Dr. Joylene Grapes at [facility]-  was c/o fatigue and a poor appetite, AVF was ready so decision was made to go ahead and start her on dialysis-  was to go to the TCU at [facility] center today even though had no intention of doing home dialysis.   Pt and daughter live in [place] so the plan would be to eventually go to [facility] for IHD.  Pt had been around ill contacts over the holidays-  over the last 4 days has felt more fatigued and started to develop a cough-  really not feeling like doing anything-  talked to [place] at the kidney center and was advised to come to the hospital- has tested positive for the flu - is being admitted for supportive care.    Intake/Output Summary (Last 24 hours) at 09/16/2022 0810 Last data filed at 09/15/2022 1657 Gross per 24 hour  Intake 410 ml  Output 150 ml  Net 260 ml    Vitals:  Vitals:   09/15/22 1954 09/16/22 0438 09/16/22 0631 09/16/22 0647  BP: (!) 142/54 (!) 147/59    Pulse: 68 77  84  Resp: '16 16  18  '$ Temp: 97.9 F (36.6 C) 98.4 F (36.9 C)    TempSrc: Oral Oral    SpO2: 94% (!) 89% 95% 95%  Weight:      Height:         Physical Exam:     General adult female in bed in no acute distress on O2 [place] 2L HEENT normocephalic atraumatic extraocular movements intact sclera anicteric Neck supple trachea midline Lungs crackles and occ cough; normal work of breathing at rest on room air  Heart S1S2 no rub Abdomen soft nontender nondistended Extremities no edema  Psych normal mood and affect Neuro alert and oriented x3 provides history and follows commands Access LUE AVF with bruit and thrill; bandages still in place  Medications reviewed   Labs:     Latest Ref Rng & Units 09/15/2022    4:30 AM 09/14/2022    2:25 AM 09/13/2022    2:17 AM  BMP  Glucose 70 - 99 mg/dL 99  85  80   BUN 8 - 23 mg/dL 34  53  51    Creatinine 0.44 - 1.00 mg/dL 3.65  5.22  4.91   Sodium 135 - 145 mmol/L 136  137  138   Potassium 3.5 - 5.1 mmol/L 4.1  4.6  5.1   Chloride 98 - 111 mmol/L 101  107  111   CO2 22 - 32 mmol/L '22  19  17   '$ Calcium 8.9 - 10.3 mg/dL 8.0  8.2  8.1        Rachelle Edwards, Hunt Oris, MD 09/16/2022 8:10 AM

## 2022-09-16 NOTE — Plan of Care (Signed)
  Problem: Education: Goal: Ability to describe self-care measures that may prevent or decrease complications (Diabetes Survival Skills Education) will improve Outcome: Progressing Goal: Individualized Educational Video(s) Outcome: Progressing   Problem: Coping: Goal: Ability to adjust to condition or change in health will improve Outcome: Progressing   Problem: Fluid Volume: Goal: Ability to maintain a balanced intake and output will improve Outcome: Progressing   Problem: Health Behavior/Discharge Planning: Goal: Ability to identify and utilize available resources and services will improve Outcome: Progressing Goal: Ability to manage health-related needs will improve Outcome: Progressing   Problem: Metabolic: Goal: Ability to maintain appropriate glucose levels will improve Outcome: Progressing   Problem: Nutritional: Goal: Maintenance of adequate nutrition will improve Outcome: Progressing Goal: Progress toward achieving an optimal weight will improve Outcome: Progressing   Problem: Skin Integrity: Goal: Risk for impaired skin integrity will decrease Outcome: Progressing   Problem: Tissue Perfusion: Goal: Adequacy of tissue perfusion will improve Outcome: Progressing   Problem: Education: Goal: Knowledge of General Education information will improve Description: Including pain rating scale, medication(s)/side effects and non-pharmacologic comfort measures Outcome: Progressing   Problem: Health Behavior/Discharge Planning: Goal: Ability to manage health-related needs will improve Outcome: Progressing   Problem: Clinical Measurements: Goal: Ability to maintain clinical measurements within normal limits will improve Outcome: Progressing Goal: Will remain free from infection Outcome: Progressing Goal: Diagnostic test results will improve Outcome: Progressing Goal: Respiratory complications will improve Outcome: Progressing Goal: Cardiovascular complication will  be avoided Outcome: Progressing   Problem: Activity: Goal: Risk for activity intolerance will decrease Outcome: Progressing   Problem: Nutrition: Goal: Adequate nutrition will be maintained Outcome: Progressing   Problem: Coping: Goal: Level of anxiety will decrease Outcome: Progressing   Problem: Elimination: Goal: Will not experience complications related to bowel motility Outcome: Progressing Goal: Will not experience complications related to urinary retention Outcome: Progressing   Problem: Pain Managment: Goal: General experience of comfort will improve Outcome: Progressing   Problem: Safety: Goal: Ability to remain free from injury will improve Outcome: Progressing   Problem: Skin Integrity: Goal: Risk for impaired skin integrity will decrease Outcome: Progressing   Problem: Education: Goal: Knowledge of disease and its progression will improve Outcome: Progressing Goal: Individualized Educational Video(s) Outcome: Progressing   Problem: Fluid Volume: Goal: Compliance with measures to maintain balanced fluid volume will improve Outcome: Progressing   Problem: Health Behavior/Discharge Planning: Goal: Ability to manage health-related needs will improve Outcome: Progressing   Problem: Nutritional: Goal: Ability to make healthy dietary choices will improve Outcome: Progressing   Problem: Clinical Measurements: Goal: Complications related to the disease process, condition or treatment will be avoided or minimized Outcome: Progressing

## 2022-09-16 NOTE — Procedures (Signed)
HD Note:  Some information was entered later than the data was gathered due to patient care needs. The stated time with the data is accurate.   Received patient in bed to unit.  Alert and oriented.  Informed consent signed and in chart.   Patient arterial line needle began oozing blood around the needle after treatment began. Recannulation higher in the fistula, above any previous bruising, had the same result. Treatment had to be aborted.   Patient offered support and encouragement that this would get better over time.   Transported back to the room  Alert, without acute distress.  Hand-off given to patient's nurse.   [facility]

## 2022-09-16 NOTE — Progress Notes (Signed)
Subjective:   Summary: Norma Coleman is a 89 y.o. year old female currently admitted on the IMTS HD#4 for influenza related pneumonia.  Overnight Events: NOE   Pt was seen at bedside this morning. She is feeling much better today, although she was not able to get her dialysis because her fistula infiltrated again. Her cough is improving, and is resting on 2L of [place] however feels like she can go without it.     Objective:  Vital signs in last 24 hours: Vitals:   09/16/22 0647 09/16/22 0858 09/16/22 1330 09/16/22 1530  BP:  (!) 146/64 131/69 (!) 152/69  Pulse: 84 89 69 64  Resp: '18 18 14   '$ Temp:  97.9 F (36.6 C) (!) 97.3 F (36.3 C)   TempSrc:  Oral Oral   SpO2: 95% 96% 100%   Weight:   68.6 kg   Height:       Supplemental O2: 4L [place] 100%   Physical Exam:  Constitutional: Pleasant, elderly female,  occasional cough, in no acute distress Cardiovascular: RRR, no murmurs, rubs or gallops Pulmonary/Chest: Bilateral crackles and wheezes heard throughout lung field Abdominal: soft, non-tender, non-distended Skin: warm and dry, hematoma present at fistula site, thrill palpated   Filed Weights   09/12/22 0937 09/14/22 0922 09/16/22 1330  Weight: 68 kg 68.3 kg 68.6 kg     Intake/Output Summary (Last 24 hours) at 09/16/2022 1618 Last data filed at 09/16/2022 1530 Gross per 24 hour  Intake 110 ml  Output 0 ml  Net 110 ml    Net IO Since Admission: 370 mL [09/16/22 1618]  Pertinent Labs:    Latest Ref Rng & Units 09/16/2022    7:23 AM 09/15/2022    4:30 AM 09/14/2022    2:25 AM  CBC  WBC 4.0 - 10.5 K/uL 3.1  3.4  3.0   Hemoglobin 12.0 - 15.0 g/dL 8.2  8.4  7.8   Hematocrit 36.0 - 46.0 % 26.9  26.8  26.5   Platelets 150 - 400 K/uL 194  159  173        Latest Ref Rng & Units 09/16/2022    7:23 AM 09/15/2022    4:30 AM 09/14/2022    2:25 AM  CMP  Glucose 70 - 99 mg/dL 116  99  85   BUN 8 - 23 mg/dL 37  34  53   Creatinine 0.44 - 1.00 mg/dL 4.34  3.65   5.22   Sodium 135 - 145 mmol/L 136  136  137   Potassium 3.5 - 5.1 mmol/L 4.0  4.1  4.6   Chloride 98 - 111 mmol/L 102  101  107   CO2 22 - 32 mmol/L '21  22  19   '$ Calcium 8.9 - 10.3 mg/dL 8.2  8.0  8.2       Assessment/Plan:   Principal Problem:   Influenza A Active Problems:   ESRD (end stage renal disease) (HCC)   Malnutrition of moderate degree   Patient Summary: Norma Coleman is a 89 y.o. with a pertinent PMH of end-stage renal disease, hypertension, type II diabetes mellitus, chronic diastolic heart failure, who presented with weakness and cough and admitted for influenza A pneumonia.    #Influenza A Pneumonia Oxygen requirement has improved to 2L, when at home she does not use any. She is dyspneic when she coughs, and today she states she  is feeling a little better. She continues to have a non-productive cough, likely secondary to the influenza infection, but has been improving.  Lung auscultation revealed rhonchi and some wheezes, likely in the setting of influenza pneumonia. Will continue with supportive care, and due to pt's co morbidities and age may take a longer time for pt to heal.  Plan:  - Continue dextromethorphan - Using Afrin as a decongestant - Continue tessalon perles for cough  - Monitor vitals  #ESRD on HD  Pt has had vascular access site since 8/31. Pt attempted to get dialysis today, however fistula was infiltrated and hematoma formed. Discussed with nephrology, and would like to keep her here for attempt to tomorrow or sending her to vascular lab for repair/new site. She is set up for outpatient dialysis with [facility] for Monday 09/18/22, but fistual may not be permissible anymore, and may need new access. Nephrology to follow tomorrow.   Plan:  - Appreciate nephrology recommendations  - F/U Nephro recommendations tomorrow   #Chronic Diastolic Heart Failure  #Hypertension Pt seen today at dialysis, and did seem euvolemic on exam. Systolic blood  pressure has ranged from  140-150.  Will continue amlodipine '10mg'$  daily, carvedilol 3.'125mg'$  BID, and Hydralazine '50mg'$  TID.    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 Will continue to monitor Hb with CBC, and continue Aranesp injections.    #Hyperlipidemia Atorvastatin '40mg'$  daily  #Hypothyroidism  Levothyroxine 61mg daily  Diet: Renal IVF: None,None VTE: Heparin Code: Full    Dispo: Anticipated discharge to Home in 3 days pending medical management. .Drucie Opitz MD PGY-1 Internal Medicine Resident Pager Number [phone] contact the on call pager after 5 pm and on weekends at [phone]

## 2022-09-17 ENCOUNTER — Encounter (HOSPITAL_COMMUNITY): Payer: Self-pay | Admitting: Internal Medicine

## 2022-09-17 LAB — CBC
HCT: 24.5 % — ABNORMAL LOW (ref 36.0–46.0)
Hemoglobin: 7.7 g/dL — ABNORMAL LOW (ref 12.0–15.0)
MCH: 31.4 pg (ref 26.0–34.0)
MCHC: 31.4 g/dL (ref 30.0–36.0)
MCV: 100 fL (ref 80.0–100.0)
Platelets: 190 10*3/uL (ref 150–400)
RBC: 2.45 MIL/uL — ABNORMAL LOW (ref 3.87–5.11)
RDW: 14.7 % (ref 11.5–15.5)
WBC: 3.6 10*3/uL — ABNORMAL LOW (ref 4.0–10.5)
nRBC: 0 % (ref 0.0–0.2)

## 2022-09-17 LAB — BASIC METABOLIC PANEL
Anion gap: 10 (ref 5–15)
BUN: 42 mg/dL — ABNORMAL HIGH (ref 8–23)
CO2: 23 mmol/L (ref 22–32)
Calcium: 8.1 mg/dL — ABNORMAL LOW (ref 8.9–10.3)
Chloride: 102 mmol/L (ref 98–111)
Creatinine, Ser: 4.52 mg/dL — ABNORMAL HIGH (ref 0.44–1.00)
GFR, Estimated: 9 mL/min — ABNORMAL LOW (ref >60)
Glucose, Bld: 96 mg/dL (ref 70–99)
Potassium: 3.9 mmol/L (ref 3.5–5.1)
Sodium: 135 mmol/L (ref 135–145)

## 2022-09-17 NOTE — Plan of Care (Signed)
  Problem: Education: Goal: Ability to describe self-care measures that may prevent or decrease complications (Diabetes Survival Skills Education) will improve Outcome: Progressing Goal: Individualized Educational Video(s) Outcome: Progressing   Problem: Coping: Goal: Ability to adjust to condition or change in health will improve Outcome: Progressing   Problem: Fluid Volume: Goal: Ability to maintain a balanced intake and output will improve Outcome: Progressing   Problem: Health Behavior/Discharge Planning: Goal: Ability to identify and utilize available resources and services will improve Outcome: Progressing Goal: Ability to manage health-related needs will improve Outcome: Progressing   Problem: Metabolic: Goal: Ability to maintain appropriate glucose levels will improve Outcome: Progressing   Problem: Nutritional: Goal: Maintenance of adequate nutrition will improve Outcome: Progressing Goal: Progress toward achieving an optimal weight will improve Outcome: Progressing   Problem: Skin Integrity: Goal: Risk for impaired skin integrity will decrease Outcome: Progressing   Problem: Tissue Perfusion: Goal: Adequacy of tissue perfusion will improve Outcome: Progressing   Problem: Education: Goal: Knowledge of General Education information will improve Description: Including pain rating scale, medication(s)/side effects and non-pharmacologic comfort measures Outcome: Progressing   Problem: Health Behavior/Discharge Planning: Goal: Ability to manage health-related needs will improve Outcome: Progressing   Problem: Clinical Measurements: Goal: Ability to maintain clinical measurements within normal limits will improve Outcome: Progressing Goal: Will remain free from infection Outcome: Progressing Goal: Diagnostic test results will improve Outcome: Progressing Goal: Respiratory complications will improve Outcome: Progressing Goal: Cardiovascular complication will  be avoided Outcome: Progressing   Problem: Activity: Goal: Risk for activity intolerance will decrease Outcome: Progressing   Problem: Nutrition: Goal: Adequate nutrition will be maintained Outcome: Progressing   Problem: Coping: Goal: Level of anxiety will decrease Outcome: Progressing   Problem: Elimination: Goal: Will not experience complications related to bowel motility Outcome: Progressing Goal: Will not experience complications related to urinary retention Outcome: Progressing   Problem: Pain Managment: Goal: General experience of comfort will improve Outcome: Progressing   Problem: Safety: Goal: Ability to remain free from injury will improve Outcome: Progressing   Problem: Skin Integrity: Goal: Risk for impaired skin integrity will decrease Outcome: Progressing   Problem: Education: Goal: Knowledge of disease and its progression will improve Outcome: Progressing Goal: Individualized Educational Video(s) Outcome: Progressing   Problem: Fluid Volume: Goal: Compliance with measures to maintain balanced fluid volume will improve Outcome: Progressing   Problem: Health Behavior/Discharge Planning: Goal: Ability to manage health-related needs will improve Outcome: Progressing   Problem: Nutritional: Goal: Ability to make healthy dietary choices will improve Outcome: Progressing   Problem: Clinical Measurements: Goal: Complications related to the disease process, condition or treatment will be avoided or minimized Outcome: Progressing

## 2022-09-17 NOTE — Progress Notes (Signed)
Subjective:   Summary: Norma Coleman is a 89 y.o. year old female currently admitted on the IMTS HD#5 for influenza related pneumonia.  Overnight Events: NOE   Pt was seen at bedside this morning. She is upset about decisions being made about dialysis treatment constantly moving back and forth without letting her have input. Was able to empathize and re-explain decision process. She states her breathing is improving, and feels like she does not need the oxygen anymore. Cough is improving, and pt feels much better overall.    Objective:  Vital signs in last 24 hours: Vitals:   09/16/22 1825 09/16/22 2007 09/17/22 0532 09/17/22 0753  BP: (!) 142/57 139/69 (!) 140/60 (!) 156/59  Pulse: 60 62 (!) 53 68  Resp: '16 17 18 19  '$ Temp: 98 F (36.7 C) 98.3 F (36.8 C) 97.7 F (36.5 C) 98.1 F (36.7 C)  TempSrc: Oral  Oral Oral  SpO2: 91% 97% 93% 92%  Weight:      Height:       Supplemental O2: Room Air 100%   Physical Exam:  Constitutional: Pleasant, elderly female,  occasional cough, in no acute distress Cardiovascular: RRR, no murmurs, rubs or gallops Pulmonary/Chest: Bilateral wheezes heard throughout airfield, improving Abdominal: soft, non-tender, non-distended Skin: warm and dry, hematoma present at fistula site, thrill palpated   Filed Weights   09/12/22 0937 09/14/22 0922 09/16/22 1330  Weight: 68 kg 68.3 kg 68.6 kg     Intake/Output Summary (Last 24 hours) at 09/17/2022 1109 Last data filed at 09/16/2022 1530 Gross per 24 hour  Intake --  Output 0 ml  Net 0 ml    Net IO Since Admission: 370 mL [09/17/22 1109]  Pertinent Labs:    Latest Ref Rng & Units 09/17/2022    2:04 AM 09/16/2022    7:23 AM 09/15/2022    4:30 AM  CBC  WBC 4.0 - 10.5 K/uL 3.6  3.1  3.4   Hemoglobin 12.0 - 15.0 g/dL 7.7  8.2  8.4   Hematocrit 36.0 - 46.0 % 24.5  26.9  26.8   Platelets 150 - 400 K/uL 190  194  159        Latest Ref Rng & Units 09/17/2022    2:04 AM  09/16/2022    7:23 AM 09/15/2022    4:30 AM  CMP  Glucose 70 - 99 mg/dL 96  116  99   BUN 8 - 23 mg/dL 42  37  34   Creatinine 0.44 - 1.00 mg/dL 4.52  4.34  3.65   Sodium 135 - 145 mmol/L 135  136  136   Potassium 3.5 - 5.1 mmol/L 3.9  4.0  4.1   Chloride 98 - 111 mmol/L 102  102  101   CO2 22 - 32 mmol/L '23  21  22   '$ Calcium 8.9 - 10.3 mg/dL 8.1  8.2  8.0       Assessment/Plan:   Principal Problem:   Influenza A Active Problems:   ESRD (end stage renal disease) (HCC)   Malnutrition of moderate degree   Patient Summary: Norma Coleman is a 89 y.o. with a pertinent PMH of end-stage renal disease, hypertension, type II diabetes mellitus, chronic diastolic heart failure, who presented with weakness and cough and admitted for influenza A pneumonia.    #Influenza A Pneumonia Pt is currently saturating well on room air, and  all in all feels much better than how she felt initially. Appears to be back at baseline. She still endorses a non-productive cough, however she states it has been improving significantly. Mild wheezes heard on lung exam today, much improved from previous exams. Will continue supportive care.   Plan:  - Continue dextromethorphan - Using Afrin as a decongestant - Continue tessalon perles for cough  - Monitor vitals - Duonebs as needed  #ESRD on HD  Pt has had vascular access site since 8/31. Discussion with nephrology yesterday yielded that she would be able to go home, and everything could be managed outpatient at her appointment at [facility] on 1/8. However, today, per note, nephrology would like to keep pt hear and requested a tunnel catheter by VIR in the AM, and immediate HD afterwards. This is due to her line becoming infiltrated again and bruising/hematoma present.    Plan:  - Appreciate nephrology recommendations  - F/U Nephro recommendations tomorrow - F/U Vascular recommendations   #Chronic Diastolic Heart Failure  #Hypertension Pt euvolemic  on exam. Systolic blood pressure has ranged from  140-150.  Will continue amlodipine '10mg'$  daily, carvedilol 3.'125mg'$  BID, and Hydralazine '50mg'$  TID.    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 Will continue to monitor Hb with CBC, and continue Aranesp injections.    #Hyperlipidemia Atorvastatin '40mg'$  daily  #Hypothyroidism  Levothyroxine 62mg daily  Diet: Renal IVF: None,None VTE: Heparin Code: Full    Dispo: Anticipated discharge to Home in 3 days pending medical management. .Drucie Opitz MD PGY-1 Internal Medicine Resident Pager Number [phone] contact the on call pager after 5 pm and on weekends at [phone]

## 2022-09-17 NOTE — Progress Notes (Addendum)
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  Now is feeling more fatigued, likely a combination of flu and uremic sxms.   She has been accepted to [facility] ([place] HD unit) for MWF seat and can start there on Monday, 1/8 Unfortunately infiltrated on 1/6 and on exam will need to rest the fistula for at least 2 weeks. Will request a TC by VIR in the AM and hd afterwards; she appears very fragile. I don't think holding HD for 2 weeks will be in her best interests; likely would end up coming back through the ED in a few days. If she gets TC sometime during the daytime can always reassess and possibly receive next HD as outpt on Wednesday AF HD unit. Changed to renal diet   2. Hypertension/volume  - no overt overload and HTN controlled   3. Influenza A - per primary team.    4. Acute hypoxic resp failure - oxygen requirement improving - from 4 to 2 liters, still with consistent cough  - secondary to influenza  4. Anemia of CKD  - Had been getting ESA and iron as OP in [place]-  iron sat on 12/13 was 29 with ferritin 111.  We are giving iron.  Aranesp 100 mcg weekly on Wednesdays (given 1/3) while here and will need ESA outpatient at her HD unit  4. Metabolic bone disease-  phos acceptable not on binder; PTH 116-  not on activated vitamin D  Disposition - Next HD here 1/6 then stable for discharge from a strictly renal standpoint   Subjective:  She was able to get HD 1/4 via AVF.  She was kept even.  Accepted to [facility]) on MWF 11:45 am chair time. Pt can start there on Monday, 1/8.  Strict ins/outs not available.  One unmeasured urine void over 1/4.  Coughing consistently with a little  intermittent nausea. Unfortunately infiltrated on 1/6 with multiple sticks.  Review of systems:   Coughing and short of breath  Didn't sleep well 2/2 cough  Nausea overnight - she attributes to the cough    ---------- Background on consult:  BRAELIN BROSCH is a 89 y.o. female with HTN, N4OE, diastolic heart failure.  She also is noted to have advanced CKD-  was being followed by a nephrologist in [place]-  getting prepared for dialysis -  had AVF placed.   Has recently moved here to [facility] [facility] to live with her daughter.  Saw Dr. Joylene Grapes at [facility]-  was c/o fatigue and a poor appetite, AVF was ready so decision was made to go ahead and start her on dialysis-  was to go to the TCU at [facility] kidney center even though had no intention of doing home dialysis.   Pt and daughter live in [place] so the plan would be to eventually go to [facility] for IHD.  Pt had been around ill contacts over the holidays -> Flu.    Intake/Output Summary (Last 24 hours) at 09/16/2022 0810 Last data filed at 09/15/2022 1657 Gross per 24 hour  Intake 410 ml  Output 150 ml  Net 260 ml    Vitals:  Vitals:   09/15/22 1954 09/16/22 0438 09/16/22 0631 09/16/22 0647  BP: (!) 142/54 (!) 147/59    Pulse: 68 77  84  Resp: '16 16  18  '$ Temp: 97.9 F (36.6 C) 98.4 F (36.9 C)    TempSrc: Oral Oral    SpO2: 94% (!) 89% 95% 95%  Weight:      Height:         Physical Exam:     General adult female in bed in no acute distress on O2 [place] 2L HEENT NCAT Neck supple trachea midline Lungs crackles and occ cough; normal work of breathing at rest on room air  Heart S1S2 no rub Abdomen soft nontender nondistended Extremities no edema  Psych normal mood and affect Neuro alert and oriented x3 provides history and follows commands Access LUE BCF bandages removed and no bleeding but infltration present and dense hematoma. Fistula difficult to palpate but good bruit and thrill  Medications reviewed   Labs:     Latest  Ref Rng & Units 09/15/2022    4:30 AM 09/14/2022    2:25 AM 09/13/2022    2:17 AM  BMP  Glucose 70 - 99 mg/dL 99  85  80   BUN 8 - 23 mg/dL 34  53  51   Creatinine 0.44 - 1.00 mg/dL 3.65  5.22  4.91   Sodium 135 - 145 mmol/L 136  137  138   Potassium 3.5 - 5.1 mmol/L 4.1  4.6  5.1   Chloride 98 - 111 mmol/L 101  107  111   CO2 22 - 32 mmol/L '22  19  17   '$ Calcium 8.9 - 10.3 mg/dL 8.0  8.2  8.1        Markel Mergenthaler, Hunt Oris, MD 09/16/2022 8:10 AM

## 2022-09-17 NOTE — Consult Note (Addendum)
Chief Complaint: Renal failure with fistula hematoma  Referring Physician(s): Otelia Santee  Supervising Physician: Michaelle Birks  Patient Status: [facility] - In-pt  History of Present Illness: Norma Coleman is a 89 y.o. female with ESRD on hemodialysis via a left brachiocephalic fistula.  She was admitted with weakness and cough secondary to influenza A.  Her left arm brachiocephalic fistula was accessed for hemodialysis yesterday but infiltrated and developed a hematoma.  We are asked to place a tunneled hemodialysis catheter so her fistula can be rested until hematoma resolves.  Past Medical History:  Diagnosis Date   DVT (deep venous thrombosis) (HCC)    GI bleed due to NSAIDs    High cholesterol    Hypertension    Hyperthyroidism    Pneumonia    Skin cancer    Thyroid disease     Past Surgical History:  Procedure Laterality Date   KNEE SURGERY     TONSILLECTOMY     TUBAL LIGATION      Allergies: Ibuprofen and Sulfa antibiotics  Medications: Prior to Admission medications   Medication Sig Start Date End Date Taking? Authorizing Provider  ALPRAZolam Duanne Moron) 1 MG tablet Take 1 mg by mouth at bedtime as needed for anxiety.   Yes [provider]  amLODipine (NORVASC) 10 MG tablet Take 10 mg by mouth daily. 06/30/22  Yes [provider]  ascorbic acid (VITAMIN C) 500 MG tablet Take 500 mg by mouth daily.   Yes [provider]  atorvastatin (LIPITOR) 40 MG tablet Take 40 mg by mouth daily. 01/09/20  Yes [provider]  Biotin 5 MG CAPS Take 5 mg by mouth daily.   Yes [provider]  carvedilol (COREG) 3.125 MG tablet Take 1 tablet by mouth 2 (two) times daily with a meal. 06/30/22  Yes [provider]  cholecalciferol (VITAMIN D3) 25 MCG (1000 UNIT) tablet Take 1,000 Units by mouth daily.   Yes [provider]  DM-Doxylamine-Acetaminophen (NYQUIL COLD & FLU PO) Take 5 mLs by mouth daily as needed  (cough).   Yes [provider]  furosemide (LASIX) 40 MG tablet Take 40 mg by mouth. 08/03/21  Yes [provider]  hydrALAZINE (APRESOLINE) 50 MG tablet Take 50 mg by mouth 3 (three) times daily. 04/28/22  Yes [provider]  levothyroxine (SYNTHROID) 75 MCG tablet Take 75 mcg by mouth daily before breakfast. 06/30/22  Yes [provider]  Multiple Vitamins-Minerals (MULTIVITAMIN WITH MINERALS) tablet Take 1 tablet by mouth daily.   Yes [provider]  oxyCODONE (OXY IR/ROXICODONE) 5 MG immediate release tablet Take 5 mg by mouth daily as needed for moderate pain or severe pain. 05/11/22  Yes [provider]  sodium bicarbonate 650 MG tablet Take 650 mg by mouth 2 (two) times daily. 07/11/21  Yes [provider]  vitamin B-12 (CYANOCOBALAMIN) 100 MCG tablet Take 100 mcg by mouth daily.   Yes [provider]     History reviewed. No pertinent family history.  Social History   Socioeconomic History   Marital status: Widowed    Spouse name: Not on file   Number of children: Not on file   Years of education: Not on file   Highest education level: Not on file  Occupational History   Not on file  Tobacco Use   Smoking status: Never   Smokeless tobacco: Never  Substance and Sexual Activity   Alcohol use: Never   Drug use: Never  Sexual activity: Not Currently  Other Topics Concern   Not on file  Social History Narrative   Not on file   Social Determinants of Health   Financial Resource Strain: Not on file  Food Insecurity: No Food Insecurity (09/12/2022)   Hunger Vital Sign    Worried About Running Out of Food in the Last Year: Never true    Ran Out of Food in the Last Year: Never true  Transportation Needs: No Transportation Needs (09/12/2022)   PRAPARE - Hydrologist (Medical): No    Lack of Transportation (Non-Medical): No  Physical Activity: Not on file  Stress: Not on file   Social Connections: Not on file     Review of Systems: A 12 point ROS discussed and pertinent positives are indicated in the HPI above.  All other systems are negative.  Review of Systems  Constitutional:  Positive for activity change.  Respiratory:  Positive for cough.   Neurological:  Positive for weakness.    Vital Signs: BP (!) 156/59 (BP Location: Right Arm)   Pulse 68   Temp 98.1 F (36.7 C) (Oral)   Resp 19   Ht '5\' 3"'$  (1.6 m)   Wt 151 lb 3.8 oz (68.6 kg)   SpO2 92%   BMI 26.79 kg/m   Physical Exam Vitals reviewed.  Constitutional:      Appearance: Normal appearance.  HENT:     Head: Normocephalic and atraumatic.  Eyes:     Extraocular Movements: Extraocular movements intact.  Cardiovascular:     Rate and Rhythm: Normal rate and regular rhythm.  Pulmonary:     Effort: Pulmonary effort is normal. No respiratory distress.     Breath sounds: Normal breath sounds.  Abdominal:     Palpations: Abdomen is soft.  Musculoskeletal:        General: Normal range of motion.     Cervical back: Normal range of motion.     Comments: Left upper arm fistula with palpable thrill, extensive ecchymosis.  Skin:    General: Skin is warm and dry.  Neurological:     General: No focal deficit present.     Mental Status: She is alert and oriented to person, place, and time.  Psychiatric:        Mood and Affect: Mood normal.        Behavior: Behavior normal.        Thought Content: Thought content normal.        Judgment: Judgment normal.     Imaging: DG Chest 2 View  Result Date: 09/13/2022 CLINICAL DATA:  89 year old female presents for evaluation of pneumonia. EXAM: CHEST - 2 VIEW COMPARISON:  September 12, 2022. FINDINGS: Cardiomediastinal contours remain markedly enlarged. Hilar structures are stable. Persistent RIGHT lateral chest airspace disease is unchanged. No new area of focal airspace disease. Lungs are hyperinflated with flattening of the LEFT and RIGHT hemidiaphragm  as on previous imaging. No pleural effusion. Coarse basilar interstitial prominence is also present. On limited assessment no acute skeletal findings. IMPRESSION: Persistent RIGHT lateral chest airspace disease. Findings remain suspicious for pneumonia without change. In the setting of vomiting would also correlate with any risk factors for or history of aspiration. Suggest follow-up to ensure resolution. Electronically Signed   By: Zetta Bills M.D.   On: 09/13/2022 17:43   DG Chest 2 View  Result Date: 09/12/2022 CLINICAL DATA:  Cough and vomiting. EXAM: CHEST - 2 VIEW COMPARISON:  08/05/2022 FINDINGS: Cardiomegaly again  noted. New RIGHT LOWER lung airspace disease/consolidation likely represents pneumonia. There may be a trace RIGHT pleural effusion present. The LEFT lung is clear. There is no evidence of pneumothorax or acute bony abnormality. IMPRESSION: RIGHT LOWER lung airspace disease/consolidation likely representing pneumonia. Probable trace RIGHT pleural effusion. Radiographic follow-up to resolution recommended. Electronically Signed   By: Margarette Canada M.D.   On: 09/12/2022 10:16    Labs:  CBC: Recent Labs    09/14/22 0225 09/15/22 0430 09/16/22 0723 09/17/22 0204  WBC 3.0* 3.4* 3.1* 3.6*  HGB 7.8* 8.4* 8.2* 7.7*  HCT 26.5* 26.8* 26.9* 24.5*  PLT 173 159 194 190    COAGS: Recent Labs    08/05/22 1945  INR 1.1    BMP: Recent Labs    09/14/22 0225 09/15/22 0430 09/16/22 0723 09/17/22 0204  NA 137 136 136 135  K 4.6 4.1 4.0 3.9  CL 107 101 102 102  CO2 19* 22 21* 23  GLUCOSE 85 99 116* 96  BUN 53* 34* 37* 42*  CALCIUM 8.2* 8.0* 8.2* 8.1*  CREATININE 5.22* 3.65* 4.34* 4.52*  GFRNONAA 8* 12* 9* 9*    LIVER FUNCTION TESTS: Recent Labs    08/05/22 1411 08/06/22 0958 09/13/22 0217  BILITOT 0.7  --  0.4  AST 24  --  22  ALT 18  --  15  ALKPHOS 170*  --  133*  PROT 7.0  --  5.7*  ALBUMIN 3.6 3.3* 2.7*    TUMOR MARKERS: No results for input(s): "AFPTM",  "CEA", "CA199", "CHROMGRNA" in the last 8760 hours.  Assessment and Plan:  ESRD on hemodialysis now with infiltrated left brachiocephalic fistula.  Risks and benefits discussed with the patient including, but not limited to bleeding, infection, vascular injury, pneumothorax which may require chest tube placement, air embolism or even death  All of the patient's questions were answered, patient is NOT agreeable to proceed until after she speaks with other providers involved in her care.  Consent still needs to be signed once patient is agreeable.  Will tentatively plan for placement of HD catheter tomorrow in IR as schedule allows.  NPO after MN order in place.  Thank you for allowing our service to participate in Norma Coleman 's care.  Electronically Signed: Murrell Redden, PA-C   09/17/2022, 10:17 AM      I spent a total of 20 Minutes  in face to face in clinical consultation, greater than 50% of which was counseling/coordinating care for HD cath placement.

## 2022-09-18 ENCOUNTER — Inpatient Hospital Stay (HOSPITAL_COMMUNITY): Payer: Medicare PPO

## 2022-09-18 DIAGNOSIS — J1000 Influenza due to other identified influenza virus with unspecified type of pneumonia: Secondary | ICD-10-CM | POA: Diagnosis not present

## 2022-09-18 HISTORY — PX: IR US GUIDE VASC ACCESS RIGHT: IMG2390

## 2022-09-18 HISTORY — PX: IR FLUORO GUIDE CV LINE RIGHT: IMG2283

## 2022-09-18 LAB — BASIC METABOLIC PANEL
Anion gap: 6 (ref 5–15)
BUN: 42 mg/dL — ABNORMAL HIGH (ref 8–23)
CO2: 23 mmol/L (ref 22–32)
Calcium: 8.1 mg/dL — ABNORMAL LOW (ref 8.9–10.3)
Chloride: 105 mmol/L (ref 98–111)
Creatinine, Ser: 4.51 mg/dL — ABNORMAL HIGH (ref 0.44–1.00)
GFR, Estimated: 9 mL/min — ABNORMAL LOW (ref >60)
Glucose, Bld: 95 mg/dL (ref 70–99)
Potassium: 3.9 mmol/L (ref 3.5–5.1)
Sodium: 134 mmol/L — ABNORMAL LOW (ref 135–145)

## 2022-09-18 LAB — CBC
HCT: 25.1 % — ABNORMAL LOW (ref 36.0–46.0)
Hemoglobin: 7.8 g/dL — ABNORMAL LOW (ref 12.0–15.0)
MCH: 30.8 pg (ref 26.0–34.0)
MCHC: 31.1 g/dL (ref 30.0–36.0)
MCV: 99.2 fL (ref 80.0–100.0)
Platelets: 234 10*3/uL (ref 150–400)
RBC: 2.53 MIL/uL — ABNORMAL LOW (ref 3.87–5.11)
RDW: 14.6 % (ref 11.5–15.5)
WBC: 3.9 10*3/uL — ABNORMAL LOW (ref 4.0–10.5)
nRBC: 1 % — ABNORMAL HIGH (ref 0.0–0.2)

## 2022-09-18 MED ORDER — FENTANYL CITRATE (PF) 100 MCG/2ML IJ SOLN
INTRAMUSCULAR | Status: AC
  Filled 2022-09-18: qty 4

## 2022-09-18 MED ORDER — GELATIN ABSORBABLE 12-7 MM EX MISC
CUTANEOUS | Status: AC
  Filled 2022-09-18: qty 1

## 2022-09-18 MED ORDER — HYDROMORPHONE HCL 2 MG PO TABS
1.0000 mg | ORAL_TABLET | Freq: Once | ORAL | Status: AC
  Administered 2022-09-18: 1 mg via ORAL
  Filled 2022-09-18: qty 1

## 2022-09-18 MED ORDER — FENTANYL CITRATE (PF) 100 MCG/2ML IJ SOLN
INTRAMUSCULAR | Status: AC | PRN
  Administered 2022-09-18: 25 ug via INTRAVENOUS

## 2022-09-18 MED ORDER — MIDAZOLAM HCL 2 MG/2ML IJ SOLN
INTRAMUSCULAR | Status: AC | PRN
  Administered 2022-09-18: 1 mg via INTRAVENOUS

## 2022-09-18 MED ORDER — CEFAZOLIN SODIUM-DEXTROSE 2-4 GM/100ML-% IV SOLN
INTRAVENOUS | Status: AC | PRN
  Administered 2022-09-18: 2 g via INTRAVENOUS

## 2022-09-18 MED ORDER — CEFAZOLIN SODIUM-DEXTROSE 2-4 GM/100ML-% IV SOLN
INTRAVENOUS | Status: AC
  Filled 2022-09-18: qty 100

## 2022-09-18 MED ORDER — MIDAZOLAM HCL 2 MG/2ML IJ SOLN
INTRAMUSCULAR | Status: AC
  Filled 2022-09-18: qty 4

## 2022-09-18 MED ORDER — LIDOCAINE HCL 1 % IJ SOLN
INTRAMUSCULAR | Status: AC
  Administered 2022-09-18: 10 mL
  Filled 2022-09-18: qty 20

## 2022-09-18 MED ORDER — ACETAMINOPHEN 500 MG PO TABS
1000.0000 mg | ORAL_TABLET | Freq: Three times a day (TID) | ORAL | Status: DC
  Administered 2022-09-19 – 2022-09-20 (×5): 1000 mg via ORAL
  Filled 2022-09-18 (×6): qty 2

## 2022-09-18 MED ORDER — HEPARIN SODIUM (PORCINE) 1000 UNIT/ML IJ SOLN
INTRAMUSCULAR | Status: AC
  Administered 2022-09-18: 3.2 mL
  Filled 2022-09-18: qty 10

## 2022-09-18 MED ORDER — LOPERAMIDE HCL 2 MG PO CAPS
2.0000 mg | ORAL_CAPSULE | ORAL | Status: DC | PRN
  Administered 2022-09-18 – 2022-09-19 (×2): 2 mg via ORAL
  Filled 2022-09-18 (×2): qty 1

## 2022-09-18 NOTE — Procedures (Addendum)
Interventional Radiology Procedure Note  Procedure: Placement of a right IJ approach tunneled HD cath.  Tip is positioned at the superior cavoatrial junction and catheter is ready for immediate use.  Complications: None Recommendations:  - Ok to use - Do not submerge - Routine line care  - ok to restart any AC as needed  Signed,  Dulcy Fanny. Earleen Newport, DO

## 2022-09-18 NOTE — Progress Notes (Signed)
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  Now is feeling more fatigued, likely a combination of flu and uremic sxms.   She has been accepted to [facility] ([place] HD unit) for MWF seat and can start there this week 1/08.  Unfortunately infiltrated on 1/6 and will need to rest the fistula for at least 2 weeks. IR is seeing for [facility] placement today.  Plan HD later tonight after [facility] placed or possibly in the am tomorrow. After HD should be ready for dc.  Changed to renal diet   2. Hypertension/volume  - no overt overload and HTN controlled   3. Influenza A - per primary team.    4. Acute hypoxic resp failure - oxygen requirement improving - from 4 to 2 liters, still with consistent cough  - secondary to influenza  4. Anemia of CKD  - Had been getting ESA and iron as OP in [place]-  iron sat on 12/13 was 29 with ferritin 111.  We are giving iron.  Aranesp 100 mcg weekly on Wednesdays (given 1/3) while here and will need ESA outpatient at her HD unit  4. Metabolic bone disease-  phos acceptable not on binder; PTH 116-  not on activated vitamin D  Disposition - Next HD here 1/6 then stable for discharge from a strictly renal standpoint    Sol Blazing, MD 09/18/2022 5:08 PM    Subjective: no c/o's today ---------- Background on consult:  Norma Coleman is a 89 y.o. female with HTN, T0VX, diastolic heart failure.  She also is noted to have advanced CKD-  was being followed by a nephrologist in [place]-  getting prepared for dialysis -  had AVF placed.   Has recently moved here to [place] to live with her daughter.  Saw Dr. Joylene Grapes at [facility]-  was c/o fatigue and a poor appetite, AVF  was ready so decision was made to go ahead and start her on dialysis-  was to go to the TCU at [facility] kidney center even though had no intention of doing home dialysis.   Pt and daughter live in [place] so the plan would be to eventually go to [facility] for IHD.  Pt had been around ill contacts over the holidays -> Flu.    Intake/Output Summary (Last 24 hours) at 09/18/2022 1708 Last data filed at 09/18/2022 1543 Gross per 24 hour  Intake 110 ml  Output --  Net 110 ml     Vitals:  Vitals:   09/18/22 1545 09/18/22 1550 09/18/22 1555 09/18/22 1600  BP: 132/64 (!) 140/63 (!) 148/62 136/70  Pulse: 66 67 66 70  Resp: '18 16 18 20  '$ Temp:      TempSrc:      SpO2: 96% 96% 95% 94%  Weight:      Height:         Physical Exam:     General adult female in bed in no acute distress on O2 [place] 2L HEENT NCAT Neck supple trachea midline Lungs crackles and occ cough; normal work of breathing at rest on room air  Heart S1S2 no rub Abdomen soft nontender nondistended Extremities no  edema  Psych normal mood and affect Neuro alert and oriented x3 provides history and follows commands Access LUE BCF bandages removed and no bleeding but infltration present and dense hematoma. Fistula difficult to palpate but good bruit and thrill  Medications reviewed   Labs:     Latest Ref Rng & Units 09/18/2022    5:53 AM 09/17/2022    2:04 AM 09/16/2022    7:23 AM  BMP  Glucose 70 - 99 mg/dL 95  96  116   BUN 8 - 23 mg/dL 42  42  37   Creatinine 0.44 - 1.00 mg/dL 4.51  4.52  4.34   Sodium 135 - 145 mmol/L 134  135  136   Potassium 3.5 - 5.1 mmol/L 3.9  3.9  4.0   Chloride 98 - 111 mmol/L 105  102  102   CO2 22 - 32 mmol/L '23  23  21   '$ Calcium 8.9 - 10.3 mg/dL 8.1  8.1  8.2

## 2022-09-18 NOTE — Progress Notes (Addendum)
Subjective:   Summary: Norma Coleman is a 89 y.o. year old female currently admitted on the IMTS HD#6 for influenza related pneumonia.  Overnight Events: NOE  Pt was seen at bedside this AM. She says she is eating well, and urinating with no difficulties. She denies and nausea or vomiting. Able to ambulate to the restroom without becoming dyspneic. She still has a lingering cough and congestion.  States she signed consent form for tunnel catheter placement today.  States her nose is dry, and occasionally bleeding. She has been using Afrin for decongestant, able to educate on using different nasal spray.    Objective:  Vital signs in last 24 hours: Vitals:   09/17/22 1609 09/17/22 2214 09/18/22 0555 09/18/22 0834  BP: (!) 132/48 (!) 129/53 (!) 147/57 (!) 136/59  Pulse: (!) 57 61 65 66  Resp: '18 16 17 16  '$ Temp: 97.7 F (36.5 C) (!) 97.4 F (36.3 C) 98.3 F (36.8 C) 97.6 F (36.4 C)  TempSrc: Oral Oral Oral Oral  SpO2: 92% 90% 90% 90%  Weight:      Height:       Supplemental O2: Room Air 100%   Physical Exam:  Constitutional: Pleasant, elderly female,  occasional cough, in no acute distress Cardiovascular: RRR, no murmurs, rubs or gallops Pulmonary/Chest: Bilateral wheezes heard throughout airfield, improving Abdominal: soft, non-tender, non-distended Skin: warm and dry, hematoma present at fistula site, thrill palpated, large bruising of right upper extremity on forearm.    Filed Weights   09/12/22 0937 09/14/22 0922 09/16/22 1330  Weight: 68 kg 68.3 kg 68.6 kg    No intake or output data in the 24 hours ending 09/18/22 1124  Net IO Since Admission: 370 mL [09/18/22 1124]  Pertinent Labs:    Latest Ref Rng & Units 09/18/2022    5:53 AM 09/17/2022    2:04 AM 09/16/2022    7:23 AM  CBC  WBC 4.0 - 10.5 K/uL 3.9  3.6  3.1   Hemoglobin 12.0 - 15.0 g/dL 7.8  7.7  8.2   Hematocrit 36.0 - 46.0 % 25.1  24.5  26.9   Platelets 150 - 400 K/uL 234  190   194        Latest Ref Rng & Units 09/18/2022    5:53 AM 09/17/2022    2:04 AM 09/16/2022    7:23 AM  CMP  Glucose 70 - 99 mg/dL 95  96  116   BUN 8 - 23 mg/dL 42  42  37   Creatinine 0.44 - 1.00 mg/dL 4.51  4.52  4.34   Sodium 135 - 145 mmol/L 134  135  136   Potassium 3.5 - 5.1 mmol/L 3.9  3.9  4.0   Chloride 98 - 111 mmol/L 105  102  102   CO2 22 - 32 mmol/L '23  23  21   '$ Calcium 8.9 - 10.3 mg/dL 8.1  8.1  8.2       Assessment/Plan:   Principal Problem:   Influenza A Active Problems:   ESRD (end stage renal disease) (HCC)   Malnutrition of moderate degree   Patient Summary: Norma Coleman is a 89 y.o. with a pertinent PMH of end-stage renal disease, hypertension, type II diabetes mellitus, chronic diastolic heart failure, who presented with weakness and cough and admitted for influenza A pneumonia.    #Influenza A Pneumonia Pt is currently  saturating well on room air, and all in all feels much better than how she felt initially. Appears to be back at baseline. She still endorses a non-productive cough, however she states it has been improving significantly. Very mild wheezes heard on lung exam today, much improved from previous exams.  Patient complaining of dry nose, likely from use of Afrin consistently.  Will continue supportive care.  She is not requiring any supplemental oxygen at rest or while ambulating  Plan:  - Continue dextromethorphan prn - Stop Afrin - Continue tessalon perles prn for cough  - Monitor vitals - Duonebs as needed  #ESRD on HD  Pt has had vascular access site since 8/31. Discussion with nephrology yesterday yielded that she would be able to go home, and everything could be managed outpatient at her appointment at [facility] on 1/8.  Patient signed consent form for tunnel catheter placement today, plan is to receive dialysis immediately after.  She has a chair at [place] for the outpatient setting   Plan:  - Appreciate nephrology  recommendations  - Dialysis after catheter is placed - F/U vascular recommendations post-procedure   #Chronic Diastolic Heart Failure  #Hypertension Pt euvolemic on exam. Systolic blood pressure has ranged from  130-150.  Will continue amlodipine '10mg'$  daily, carvedilol 3.'125mg'$  BID, and Hydralazine '50mg'$  TID. She remains asymptomatic with no chest pain, shortness of breath, or dizziness.   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 Will continue to monitor Hb with CBC, and continue Aranesp injections.    #Hyperlipidemia Atorvastatin '40mg'$  daily  #Hypothyroidism  Levothyroxine 87mg daily  Diet: Renal IVF: None,None VTE: Heparin Code: Full    Dispo: Anticipated discharge to Home in 1 days pending medical management. .Drucie Opitz MD PGY-1 Internal Medicine Resident Pager Number [phone] contact the on call pager after 5 pm and on weekends at [phone]

## 2022-09-18 NOTE — Progress Notes (Signed)
Update provided to [facility]. Will assist as needed.   Melven Sartorius Renal Navigator [phone]

## 2022-09-18 NOTE — Progress Notes (Signed)
PT Cancellation Note  Patient Details Name: Norma Coleman MRN: [id number] DOB: [birth year]   Cancelled Treatment:    Reason Eval/Treat Not Completed: Patient declined, no reason specified;Other (comment) (Pt is scheduled for [place] placement for HD today. Pt declined stating she has been up today despite encouragement and education. Pt encouraged to get up with PT and discussed that going to bathroom and back is good but she needs to mobilize more to build up strength and improve respiratory status for home. Daughter was in room and encouraging as well but pt continued to decline. Will continue to follow up as able and appropriate.)   Tomma Rakers, DPT, [place] Office: [phone] (Secure chat preferred)   Ander Purpura 09/18/2022, 2:41 PM

## 2022-09-19 DIAGNOSIS — J1000 Influenza due to other identified influenza virus with unspecified type of pneumonia: Secondary | ICD-10-CM | POA: Diagnosis not present

## 2022-09-19 LAB — BASIC METABOLIC PANEL
Anion gap: 9 (ref 5–15)
BUN: 14 mg/dL (ref 8–23)
CO2: 27 mmol/L (ref 22–32)
Calcium: 8.3 mg/dL — ABNORMAL LOW (ref 8.9–10.3)
Chloride: 97 mmol/L — ABNORMAL LOW (ref 98–111)
Creatinine, Ser: 2.27 mg/dL — ABNORMAL HIGH (ref 0.44–1.00)
GFR, Estimated: 20 mL/min — ABNORMAL LOW (ref >60)
Glucose, Bld: 92 mg/dL (ref 70–99)
Potassium: 3.9 mmol/L (ref 3.5–5.1)
Sodium: 133 mmol/L — ABNORMAL LOW (ref 135–145)

## 2022-09-19 LAB — CBC
HCT: 25.8 % — ABNORMAL LOW (ref 36.0–46.0)
Hemoglobin: 8.1 g/dL — ABNORMAL LOW (ref 12.0–15.0)
MCH: 31.4 pg (ref 26.0–34.0)
MCHC: 31.4 g/dL (ref 30.0–36.0)
MCV: 100 fL (ref 80.0–100.0)
Platelets: 209 10*3/uL (ref 150–400)
RBC: 2.58 MIL/uL — ABNORMAL LOW (ref 3.87–5.11)
RDW: 14.6 % (ref 11.5–15.5)
WBC: 5 10*3/uL (ref 4.0–10.5)
nRBC: 1 % — ABNORMAL HIGH (ref 0.0–0.2)

## 2022-09-19 MED ORDER — BOOST / RESOURCE BREEZE PO LIQD CUSTOM
1.0000 | Freq: Two times a day (BID) | ORAL | Status: DC
  Filled 2022-09-19: qty 1

## 2022-09-19 MED ORDER — CHLORHEXIDINE GLUCONATE CLOTH 2 % EX PADS
6.0000 | MEDICATED_PAD | Freq: Every day | CUTANEOUS | Status: DC
  Administered 2022-09-20: 6 via TOPICAL

## 2022-09-19 MED ORDER — HEPARIN SODIUM (PORCINE) 1000 UNIT/ML IJ SOLN
INTRAMUSCULAR | Status: AC
  Administered 2022-09-19: 1000 [IU]
  Filled 2022-09-19: qty 4

## 2022-09-19 NOTE — Progress Notes (Signed)
Pt arrived back from Dialysis.  Giving evening meds

## 2022-09-19 NOTE — Progress Notes (Signed)
Physical Therapy Treatment Patient Details Name: Norma Coleman MRN: [id number] DOB: 03/21/1933 Today's Date: 09/19/2022   History of Present Illness 89 y.o. female presents to [facility] hospital on 09/12/2022 with weakness, vomiting, diarrhea and cough, found to be fluA positive. PMH includes ESRD, HTN, DMII, CHF.    PT Comments    Pt admitted with above diagnosis. Pt was able to ambulate in hallway with min guard to supervision with pt reporting still feeling weak but she did progress.  Will continue PT.  Pt currently with functional limitations due to endurance deficits. Pt will benefit from skilled PT to increase their independence and safety with mobility to allow discharge to the venue listed below.      Recommendations for follow up therapy are one component of a multi-disciplinary discharge planning process, led by the attending physician.  Recommendations may be updated based on patient status, additional functional criteria and insurance authorization.  Follow Up Recommendations  Home health PT     Assistance Recommended at Discharge Intermittent Supervision/Assistance  Patient can return home with the following A little help with walking and/or transfers;A little help with bathing/dressing/bathroom;Assistance with cooking/housework;Assist for transportation;Help with stairs or ramp for entrance   Equipment Recommendations  None recommended by PT    Recommendations for Other Services       Precautions / Restrictions Precautions Precautions: Fall Precaution Comments: Flu A Restrictions Weight Bearing Restrictions: No     Mobility  Bed Mobility Overal bed mobility: Needs Assistance Bed Mobility: Supine to Sit, Sit to Supine     Supine to sit: Supervision Sit to supine: Supervision   General bed mobility comments: patient willing to get OOB for toileting    Transfers Overall transfer level: Needs assistance Equipment used: Rolling walker (2 wheels) Transfers: Sit  to/from Stand, Bed to chair/wheelchair/BSC Sit to Stand: Supervision           General transfer comment: supervision with RW, pt went to bathroom first and was able to manage clothes and cleaning herself on her own.    Ambulation/Gait Ambulation/Gait assistance: Supervision, Min guard Gait Distance (Feet): 210 Feet Assistive device: Rolling walker (2 wheels) Gait Pattern/deviations: Step-through pattern, Drifts right/left, Staggering left, Decreased stride length Gait velocity: reduced Gait velocity interpretation: 1.31 - 2.62 ft/sec, indicative of limited community ambulator   General Gait Details: Pt needed occasional cues for proximity to RW.  Pt overall safe with use of RW and needs encouragement to push self.   Stairs             Wheelchair Mobility    Modified Rankin (Stroke Patients Only)       Balance Overall balance assessment: Needs assistance Sitting-balance support: No upper extremity supported, Feet supported Sitting balance-Leahy Scale: Good     Standing balance support: No upper extremity supported, During functional activity Standing balance-Leahy Scale: Fair Standing balance comment: stood at sink for grooming tasks with no UE support                            Cognition Arousal/Alertness: Awake/alert Behavior During Therapy: WFL for tasks assessed/performed Overall Cognitive Status: Impaired/Different from baseline Area of Impairment: Safety/judgement, Awareness                         Safety/Judgement: Decreased awareness of safety Awareness: Emergent   General Comments: limited participation due to fatigue        Exercises  General Comments        Pertinent Vitals/Pain Pain Assessment Pain Assessment: No/denies pain    Home Living                          Prior Function            PT Goals (current goals can now be found in the care plan section) Acute Rehab PT Goals Patient Stated  Goal: to return to prior level of function Progress towards PT goals: Progressing toward goals    Frequency    Min 3X/week      PT Plan Current plan remains appropriate    Co-evaluation              AM-PAC PT "6 Clicks" Mobility   Outcome Measure  Help needed turning from your back to your side while in a flat bed without using bedrails?: A Little Help needed moving from lying on your back to sitting on the side of a flat bed without using bedrails?: A Little Help needed moving to and from a bed to a chair (including a wheelchair)?: A Little Help needed standing up from a chair using your arms (e.g., wheelchair or bedside chair)?: A Little Help needed to walk in hospital room?: A Little Help needed climbing 3-5 steps with a railing? : A Lot 6 Click Score: 17    End of Session Equipment Utilized During Treatment: Gait belt Activity Tolerance: Patient tolerated treatment well Patient left: in bed;with family/visitor present;with call bell/phone within reach Nurse Communication: Mobility status PT Visit Diagnosis: Other abnormalities of gait and mobility (R26.89);Muscle weakness (generalized) (M62.81);History of falling (Z91.81)     Time: 0677-0340 PT Time Calculation (min) (ACUTE ONLY): 25 min  Charges:  $Gait Training: 23-37 mins                     Tranika Scholler M,PT Acute Rehab Services [place] 09/19/2022, 11:52 AM

## 2022-09-19 NOTE — Progress Notes (Signed)
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 Pt ready for dc from renal standpoint.  Unfortunately her AVF infiltrated on 1/6 and will need to rest the fistula for at least 2 weeks. IR placed [facility] 1/08, appreciate assistance.  HD tomorrow.   2. Hypertension/volume  - no overload and HTN controlled   3. Influenza A - per primary team.    4. Acute hypoxic resp failure - oxygen requirement better, using 2L or RA now - secondary to influenza  4. Anemia of CKD  - Had been getting ESA and iron as OP in [place]-  iron sat on 12/13 was 29 with ferritin 111.  We are giving iron.  Aranesp 100 mcg weekly on Wednesdays (given 1/3) while here and will need ESA outpatient at her HD unit  4. Metabolic bone disease-  phos acceptable not on binder; PTH 116-  not on activated vitamin D    Sol Blazing, MD 09/18/2022 5:08 PM    Subjective: c/o no appetite and some loose stools.  ---------- Background on consult:  Norma Coleman is a 89 y.o. female with HTN, F0YD, diastolic heart failure.  She also is noted to have advanced CKD-  was being followed by a nephrologist in [place]-  getting prepared for dialysis -  had AVF placed.   Has recently moved here to [facility] to live with her daughter.  Saw Dr. Joylene Grapes at [facility]-  was c/o fatigue and a poor appetite, AVF was ready so decision was made to go ahead and start her on dialysis-  was to go to the TCU at [facility] kidney center even though had no intention of doing home dialysis.   Pt and daughter live in [place] so the plan would be to eventually go to [facility] for IHD.  Pt had been around ill contacts over the holidays -> Flu.    Intake/Output Summary  (Last 24 hours) at 09/19/2022 1129 Last data filed at 09/19/2022 0900 Gross per 24 hour  Intake 350 ml  Output 1100 ml  Net -750 ml     Vitals:  Vitals:   09/19/22 0135 09/19/22 0459 09/19/22 0744 09/19/22 0942  BP: 137/65 (!) 116/51 (!) 134/51   Pulse: 62 (!) 55 (!) 59 61  Resp: (!) '23 17 15   '$ Temp: 98.7 F (37.1 C) 97.9 F (36.6 C) 98.2 F (36.8 C)   TempSrc:  Oral Oral   SpO2: 100% 97% 98%   Weight: 65.6 kg     Height:         Physical Exam:     General adult female in bed in no acute distress on O2 [place] 2L HEENT NCAT Neck supple trachea midline Lungs crackles and occ cough; normal work of breathing at rest on room air  Heart S1S2 no rub Abdomen soft nontender nondistended Extremities no edema  Psych normal mood and affect Neuro alert and oriented x3 provides history and follows commands Access LUE BCF bandages removed and no bleeding but infltration present and dense hematoma. Fistula difficult to palpate but good bruit and thrill  Medications reviewed   Labs:     Latest Ref Rng & Units 09/19/2022  7:32 AM 09/18/2022    5:53 AM 09/17/2022    2:04 AM  BMP  Glucose 70 - 99 mg/dL 92  95  96   BUN 8 - 23 mg/dL 14  42  42   Creatinine 0.44 - 1.00 mg/dL 2.27  4.51  4.52   Sodium 135 - 145 mmol/L 133  134  135   Potassium 3.5 - 5.1 mmol/L 3.9  3.9  3.9   Chloride 98 - 111 mmol/L 97  105  102   CO2 22 - 32 mmol/L '27  23  23   '$ Calcium 8.9 - 10.3 mg/dL 8.3  8.1  8.1

## 2022-09-19 NOTE — Progress Notes (Signed)
   09/19/22 0135  Vitals  Temp 98.7 F (37.1 C)  BP 137/65  MAP (mmHg) 83  Pulse Rate 62  ECG Heart Rate 75  Resp (!) 23  Post Treatment  Dialyzer Clearance Lightly streaked  Duration of HD Treatment -hour(s) 3 hour(s)  Liters Processed 69.4  Fluid Removed (mL) 1100 mL  Tolerated HD Treatment Yes   TX fin. W/o difficulty.

## 2022-09-19 NOTE — Progress Notes (Signed)
Nutrition Follow-up  DOCUMENTATION CODES:   Non-severe (moderate) malnutrition in context of chronic illness  INTERVENTION:  - Liberalize to Reg diet.   - Modify supplements to Boost Breeze po BID, each supplement provides 250 kcal and 9 grams of protein  NUTRITION DIAGNOSIS:   Moderate Malnutrition related to chronic illness as evidenced by energy intake < or equal to 75% for > or equal to 1 month, percent weight loss.  GOAL:   Patient will meet greater than or equal to 90% of their needs  MONITOR:   PO intake, Supplement acceptance  REASON FOR ASSESSMENT:   Consult Diet education  ASSESSMENT:   89 y.o. female admits related to weakness and cough. PMH includes: ESRD, HTN, T2DM, CHF. Pt is currently receiving medical management related to influenza A.  Meds reviewed: lipitor, rena-vit. Labs reviewed: Na low.   Pt continues with poor PO intakes. Pt reports that she was able to eat some pancakes and a muffin this am. Pt's diet had been changed back to Renal restriction. RD will change back to Regular diet with 1200 mL fluid restriction. Pt also reports that she does not like Nepro shakes, will change them to [facility] for now. Pt is planned for possible discharge tomorrow. RD will continue to monitor PO intakes.   Diet Order:   Diet Order             Diet regular Room service appropriate? Yes with Assist; Fluid consistency: Thin; Fluid restriction: 1200 mL Fluid  Diet effective now                   EDUCATION NEEDS:   Not appropriate for education at this time  Skin:  Skin Assessment: Reviewed RN Assessment  Last BM:  09/18/22  Height:   Ht Readings from Last 1 Encounters:  09/12/22 '5\' 3"'$  (1.6 m)    Weight:   Wt Readings from Last 1 Encounters:  09/19/22 65.6 kg    Ideal Body Weight:     BMI:  Body mass index is 25.62 kg/m.  Estimated Nutritional Needs:   Kcal:  1705-2050 kcals  Protein:  85-100  Fluid:  >/= 1.7  Charmelle Soh Graciela Husbands, RD,  LDN, CNSC.

## 2022-09-19 NOTE — Progress Notes (Addendum)
Contacted attending to inquire about possible d/c date so update can be given to out-pt clinic regarding whether pt will start at clinic tomorrow or not. Will await determination.   Melven Sartorius Renal Navigator [phone]  Addendum at 3:45 pm: Pt not stable for d/c today. Contacted [place] [facility] to advise clinic pt will not start at clinic tomorrow as planned.

## 2022-09-19 NOTE — Progress Notes (Signed)
Subjective:   Summary: Norma Coleman is a 89 y.o. year old female currently admitted on the IMTS HD#7 for influenza related pneumonia.  Overnight Events: NOE  Pt was seen at bedside this AM.  Her diet has improved, however still complaining of diarrhea.  Daughter at bedside as well.  She patient was seen ambulating in the hallway, she said she had no trouble or did not become dyspneic when ambulating either.  She states she feels weaker today than she did yesterday.   Objective:  Vital signs in last 24 hours: Vitals:   09/19/22 0135 09/19/22 0459 09/19/22 0744 09/19/22 0942  BP: 137/65 (!) 116/51 (!) 134/51   Pulse: 62 (!) 55 (!) 59 61  Resp: (!) '23 17 15   '$ Temp: 98.7 F (37.1 C) 97.9 F (36.6 C) 98.2 F (36.8 C)   TempSrc:  Oral Oral   SpO2: 100% 97% 98%   Weight: 65.6 kg     Height:       Supplemental O2: Room Air 100%   Physical Exam:  Constitutional: Pleasant, elderly female,  occasional cough, in no acute distress Cardiovascular: RRR, no murmurs, rubs or gallops Pulmonary/Chest: Lungs clear to auscultation  Abdominal: soft, non-tender, non-distended Skin: warm and dry, hematoma present at fistula site, thrill palpated, large bruising of left upper extremity on forearm.    Filed Weights   09/14/22 0922 09/16/22 1330 09/19/22 0135  Weight: 68.3 kg 68.6 kg 65.6 kg     Intake/Output Summary (Last 24 hours) at 09/19/2022 1520 Last data filed at 09/19/2022 0900 Gross per 24 hour  Intake 350 ml  Output 1100 ml  Net -750 ml    Net IO Since Admission: -380 mL [09/19/22 1520]  Pertinent Labs:    Latest Ref Rng & Units 09/19/2022    7:32 AM 09/18/2022    5:53 AM 09/17/2022    2:04 AM  CBC  WBC 4.0 - 10.5 K/uL 5.0  3.9  3.6   Hemoglobin 12.0 - 15.0 g/dL 8.1  7.8  7.7   Hematocrit 36.0 - 46.0 % 25.8  25.1  24.5   Platelets 150 - 400 K/uL 209  234  190        Latest Ref Rng & Units 09/19/2022    7:32 AM 09/18/2022    5:53 AM 09/17/2022    2:04 AM   CMP  Glucose 70 - 99 mg/dL 92  95  96   BUN 8 - 23 mg/dL 14  42  42   Creatinine 0.44 - 1.00 mg/dL 2.27  4.51  4.52   Sodium 135 - 145 mmol/L 133  134  135   Potassium 3.5 - 5.1 mmol/L 3.9  3.9  3.9   Chloride 98 - 111 mmol/L 97  105  102   CO2 22 - 32 mmol/L '27  23  23   '$ Calcium 8.9 - 10.3 mg/dL 8.3  8.1  8.1       Assessment/Plan:   Principal Problem:   Influenza A Active Problems:   ESRD (end stage renal disease) (HCC)   Malnutrition of moderate degree   Patient Summary: Norma Coleman is a 89 y.o. with a pertinent PMH of end-stage renal disease, hypertension, type II diabetes mellitus, chronic diastolic heart failure, who presented with weakness and cough and admitted for influenza A pneumonia.    #Influenza A Pneumonia Pt is currently saturating well on  room air, and all in all feels much better than how she felt initially. Appears to be back at baseline. She still endorses a non-productive cough, however she states it has been improving significantly.  Wheezes have improved, lungs are clear to auscultation today.  Patient was ambulating in the hallway today, with no issues.  Will continue supportive care.  She is not requiring any supplemental oxygen at rest or while ambulating. Patient was checked in the p.m., she was actively vomiting, could be a mixture of flu symptoms and initiation of dialysis.  Plan:  - Continue dextromethorphan prn - Continue tessalon perles prn for cough  - Monitor vitals - Duonebs as needed -Zofran for nausea/vomiting  #ESRD on HD  Pt has had vascular access site since 8/31.  Tunneled catheter was placed yesterday, and patient received dialysis immediately after.  Patient endorses weakness today.  She has outpatient follow-up with [facility] however given current symptoms we will have dialysis tomorrow inpatient. Plan:  - Appreciate nephrology recommendations  - Dialysis tomorrow   #Chronic Diastolic Heart Failure  #Hypertension Pt  euvolemic on exam. Systolic blood pressure has ranged from  130-150.  Will continue amlodipine '10mg'$  daily, carvedilol 3.'125mg'$  BID, and Hydralazine '50mg'$  TID. She remains asymptomatic with no chest pain, shortness of breath, or dizziness.   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 Will continue to monitor Hb with CBC, and continue Aranesp injections with dialysis.    #Hyperlipidemia Atorvastatin '40mg'$  daily  #Hypothyroidism  Levothyroxine 60mg daily  Diet: Renal IVF: None,None VTE: Heparin Code: Full    Dispo: Anticipated discharge to Home in 1 days pending medical management. .Drucie Opitz MD PGY-1 Internal Medicine Resident Pager Number [phone] contact the on call pager after 5 pm and on weekends at [phone]

## 2022-09-19 NOTE — Progress Notes (Signed)
Mobility Specialist Progress Note:   09/19/22 1559  Mobility  Activity Dangled on edge of bed  Level of Assistance Independent  Assistive Device None  Activity Response Tolerated fair  Mobility Referral Yes  $Mobility charge 1 Mobility   Pt received in bed and agreeable. C/o nausea, worsened with sitting EOB. Pt left in bed with all needs met, call bell in reach, and family in room.   Andrey Campanile Mobility Specialist Please contact via SecureChat or  Rehab office at [phone]

## 2022-09-20 DIAGNOSIS — J101 Influenza due to other identified influenza virus with other respiratory manifestations: Secondary | ICD-10-CM | POA: Diagnosis not present

## 2022-09-20 DIAGNOSIS — N186 End stage renal disease: Secondary | ICD-10-CM | POA: Diagnosis not present

## 2022-09-20 DIAGNOSIS — Z992 Dependence on renal dialysis: Secondary | ICD-10-CM | POA: Diagnosis not present

## 2022-09-20 LAB — CBC
HCT: 24.9 % — ABNORMAL LOW (ref 36.0–46.0)
Hemoglobin: 8.1 g/dL — ABNORMAL LOW (ref 12.0–15.0)
MCH: 32.3 pg (ref 26.0–34.0)
MCHC: 32.5 g/dL (ref 30.0–36.0)
MCV: 99.2 fL (ref 80.0–100.0)
Platelets: 199 10*3/uL (ref 150–400)
RBC: 2.51 MIL/uL — ABNORMAL LOW (ref 3.87–5.11)
RDW: 14.8 % (ref 11.5–15.5)
WBC: 5 10*3/uL (ref 4.0–10.5)
nRBC: 1.4 % — ABNORMAL HIGH (ref 0.0–0.2)

## 2022-09-20 LAB — BASIC METABOLIC PANEL
Anion gap: 10 (ref 5–15)
BUN: 18 mg/dL (ref 8–23)
CO2: 24 mmol/L (ref 22–32)
Calcium: 8.2 mg/dL — ABNORMAL LOW (ref 8.9–10.3)
Chloride: 99 mmol/L (ref 98–111)
Creatinine, Ser: 3.15 mg/dL — ABNORMAL HIGH (ref 0.44–1.00)
GFR, Estimated: 14 mL/min — ABNORMAL LOW (ref >60)
Glucose, Bld: 90 mg/dL (ref 70–99)
Potassium: 3.8 mmol/L (ref 3.5–5.1)
Sodium: 133 mmol/L — ABNORMAL LOW (ref 135–145)

## 2022-09-20 MED ORDER — ONDANSETRON HCL 4 MG PO TABS: 4.0000 mg | ORAL_TABLET | Freq: Every day | ORAL | 1 refills | Status: AC | PRN

## 2022-09-20 MED ORDER — HEPARIN SODIUM (PORCINE) 1000 UNIT/ML IJ SOLN
INTRAMUSCULAR | Status: AC
  Filled 2022-09-20: qty 4

## 2022-09-20 MED ORDER — BENZONATATE 100 MG PO CAPS: 100.0000 mg | ORAL_CAPSULE | Freq: Three times a day (TID) | ORAL | 0 refills | Status: DC | PRN

## 2022-09-20 NOTE — Progress Notes (Signed)
   09/20/22 1239  Vitals  Temp 98.2 F (36.8 C)  Pulse Rate 73  Resp 17  BP 136/61  SpO2 90 %  O2 Device Room Air  Weight 62.5 kg  Type of Weight Post-Dialysis  Oxygen Therapy  Patient Activity (if Appropriate) In bed  Post Treatment  Dialyzer Clearance Lightly streaked  Duration of HD Treatment -hour(s) 3.4 hour(s)  Hemodialysis Intake (mL) 0 mL  Liters Processed 66.5  Fluid Removed (mL) 900 mL  Tolerated HD Treatment Yes   Received patient in bed to unit.  Alert and oriented.  Informed consent signed and in chart.   Treatment initiated: 0900 Treatment completed: 1230  Patient tolerated well.  Transported back to the room  Alert, without acute distress.  Hand-off given to patient's nurse.   Access used: [facility] Access issues: none  Total UF removed: 917m Medication(s) given: none   Na'Shaminy T [facility]

## 2022-09-20 NOTE — Progress Notes (Signed)
CSW received notification from MD stating patient could benefit from [facility] PT.  CSW spoke with patient's daughter Claiborne Billings who is agreeable to [facility] reports the patient will be living at [address] [place], [place] [address]. Claiborne Billings states she does not have a preference for home health agency.  CSW spoke with Tommi Rumps of [facility] who states the agency can accept patient for [facility] PT services. Alvis Lemmings will contact the patient and her daughter directly to initiate services.  Madilyn Fireman, MSW, LCSW Transitions of Care  Clinical Social Worker II [phone]

## 2022-09-20 NOTE — Discharge Instructions (Signed)
It was a pleasure taking care of you in the hospital! I am sending some Zofran and Tesselon Perles to your pharmacy. Please make an appointment with your primary care provider as soon as possible for follow up. If you'd like, we have a clinic in the basement of the hospital called the internal medicine center; and the number is [phone]. Take care!

## 2022-09-20 NOTE — Progress Notes (Addendum)
D/C order noted. Contacted [facility] to advise clinic of pt's d/c today and that pt will start on Friday. Updated AVS. Contacted renal PA regarding clinic's need for orders.   Melven Sartorius Renal Navigator [phone]   Addendum at 3:43 pm: Spoke to pt's daughter via phone to confirm pt will start at clinic on Friday and to make sure daughter aware of arrival time. Daughter aware and agreeable to plan.

## 2022-09-20 NOTE — Progress Notes (Signed)
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 Pt ready for dc from renal standpoint.  Unfortunately her AVF infiltrated on 1/6 and will need to rest the fistula for at least 2 weeks so IR placed [facility] 1/08 HD today   2. Hypertension/volume  - no overload and HTN controlled   3. Influenza A - per primary team.    4. Acute hypoxic resp failure - oxygen requirement better, using 2L or RA now - secondary to influenza  4. Anemia of CKD  - Had been getting ESA and iron as OP in [place]-  iron sat on 12/13 was 29 with ferritin 111.  We are giving iron.  Aranesp 100 mcg weekly on Wednesdays (given 1/3) while here and will need ESA outpatient at her HD unit  4. Metabolic bone disease-  phos acceptable not on binder; PTH 116-  not on activated vitamin D    Sol Blazing, MD 09/18/2022 5:08 PM    Subjective: c/o no appetite and some loose stools.  ---------- Background on consult:  Norma Coleman is a 89 y.o. female with HTN, Q4ON, diastolic heart failure.  She also is noted to have advanced CKD-  was being followed by a nephrologist in [place]-  getting prepared for dialysis -  had AVF placed.   Has recently moved here to [facility] to live with her daughter.  Saw Dr. Joylene Grapes at [facility]-  was c/o fatigue and a poor appetite, AVF was ready so decision was made to go ahead and start her on dialysis-  was to go to the TCU at [facility] kidney center even though had no intention of doing home dialysis.   Pt and daughter live in [place] so the plan would be to eventually go to [facility] for IHD.  Pt had been around ill contacts over the holidays -> Flu.    Intake/Output Summary (Last 24 hours) at  09/20/2022 1355 Last data filed at 09/20/2022 1239 Gross per 24 hour  Intake --  Output 900 ml  Net -900 ml     Vitals:  Vitals:   09/20/22 1130 09/20/22 1200 09/20/22 1230 09/20/22 1239  BP: (!) 124/53 (!) 142/60 (!) 149/47 136/61  Pulse: 63 68 70 73  Resp: '14 15 14 17  '$ Temp:    98.2 F (36.8 C)  TempSrc:      SpO2:    90%  Weight:    62.5 kg  Height:         Physical Exam:     General adult female in bed in no acute distress on O2 [place] 2L HEENT NCAT Neck supple trachea midline Lungs crackles and occ cough; normal work of breathing at rest on room air  Heart S1S2 no rub Abdomen soft nontender nondistended Extremities no edema  Psych normal mood and affect Neuro alert and oriented x3 provides history and follows commands Access LUE BCF bandages removed and no bleeding but infltration present and dense hematoma. Fistula difficult to palpate but good bruit and thrill  Medications reviewed   Labs:     Latest Ref Rng & Units 09/20/2022    2:38 AM 09/19/2022    7:32 AM  09/18/2022    5:53 AM  BMP  Glucose 70 - 99 mg/dL 90  92  95   BUN 8 - 23 mg/dL 18  14  42   Creatinine 0.44 - 1.00 mg/dL 3.15  2.27  4.51   Sodium 135 - 145 mmol/L 133  133  134   Potassium 3.5 - 5.1 mmol/L 3.8  3.9  3.9   Chloride 98 - 111 mmol/L 99  97  105   CO2 22 - 32 mmol/L '24  27  23   '$ Calcium 8.9 - 10.3 mg/dL 8.2  8.3  8.1

## 2022-09-20 NOTE — Progress Notes (Addendum)
OT Cancellation Note  Patient Details Name: Norma Coleman MRN: [id number] DOB: May 24, 1933   Cancelled Treatment:    Reason Eval/Treat Not Completed: Patient at procedure or test/ unavailable;Other (comment) (HD) pt out of room at HD, will check back as time allows.   Returned at 1501 with pt reporting fatigue from HD and reports being DC'ed soon, pt politely declined working with therapy at this time, will follow acutely.  Harley Alto., COTA/L Acute Rehabilitation Services [phone]   Precious Haws 09/20/2022, 9:13 AM

## 2022-09-20 NOTE — Progress Notes (Signed)
Pt received in bed no c/os and no distress noted stable for HD via [facility] LUA AVF +/+ UFG 1L

## 2022-09-21 ENCOUNTER — Telehealth: Payer: Self-pay | Admitting: Nephrology

## 2022-09-21 NOTE — Telephone Encounter (Signed)
Transition of care contact from inpatient facility  Date of discharge: 09/20/22 Date of contact: 09/21/22 Method: Phone Spoke to: Patient  Patient contacted to discuss transition of care from recent inpatient hospitalization. Patient was admitted to [facility] from 09/12/22 to 09/30/22... with discharge diagnosis of ESRD progressed to ESRD HD Start, Influenza A, HTN  Chr D HF.  Medication changes were reviewed.hold bp meds pre hd , dc sod Bicarb   Patient will follow up with his/her outpatient HD unit on: 09/22/22

## 2022-09-26 ENCOUNTER — Encounter (HOSPITAL_COMMUNITY): Payer: Medicare PPO

## 2022-10-02 ENCOUNTER — Other Ambulatory Visit: Payer: Self-pay | Admitting: Student

## 2023-08-13 DIAGNOSIS — N186 End stage renal disease: Secondary | ICD-10-CM | POA: Diagnosis not present

## 2023-08-13 DIAGNOSIS — N2581 Secondary hyperparathyroidism of renal origin: Secondary | ICD-10-CM | POA: Diagnosis not present

## 2023-08-13 DIAGNOSIS — Z992 Dependence on renal dialysis: Secondary | ICD-10-CM | POA: Diagnosis not present

## 2023-08-14 ENCOUNTER — Other Ambulatory Visit: Payer: Self-pay

## 2023-08-14 ENCOUNTER — Encounter: Payer: Self-pay | Admitting: Physical Therapy

## 2023-08-14 ENCOUNTER — Ambulatory Visit: Payer: Medicare PPO | Attending: Specialist | Admitting: Physical Therapy

## 2023-08-14 DIAGNOSIS — M25561 Pain in right knee: Secondary | ICD-10-CM | POA: Insufficient documentation

## 2023-08-14 DIAGNOSIS — R2681 Unsteadiness on feet: Secondary | ICD-10-CM

## 2023-08-14 DIAGNOSIS — M25551 Pain in right hip: Secondary | ICD-10-CM

## 2023-08-14 DIAGNOSIS — R262 Difficulty in walking, not elsewhere classified: Secondary | ICD-10-CM

## 2023-08-14 DIAGNOSIS — G8929 Other chronic pain: Secondary | ICD-10-CM

## 2023-08-14 NOTE — Therapy (Signed)
OUTPATIENT PHYSICAL THERAPY LOWER EXTREMITY EVALUATION   Patient Name: Norma Coleman MRN: [id number] DOB:07/28/1933, 89 y.o., female Today's Date: 08/14/2023  END OF SESSION:  PT End of Session - 08/14/23 1443     Visit Number 1    Number of Visits 17    Date for PT Re-Evaluation 10/09/23    Authorization Type [employer] MCR    Authorization Time Period 08/14/23 to 10/09/23    Progress Note Due on Visit 10    PT Start Time 1347    PT Stop Time 1427    PT Time Calculation (min) 40 min    Activity Tolerance Patient tolerated treatment well    Behavior During Therapy WFL for tasks assessed/performed             Past Medical History:  Diagnosis Date   DVT (deep venous thrombosis) (HCC)    GI bleed due to NSAIDs    High cholesterol    Hypertension    Hyperthyroidism    Pneumonia    Skin cancer    Thyroid disease    Past Surgical History:  Procedure Laterality Date   IR FLUORO GUIDE CV LINE RIGHT  09/18/2022   IR US GUIDE VASC ACCESS RIGHT  09/18/2022   KNEE SURGERY     TONSILLECTOMY     TUBAL LIGATION     Patient Active Problem List   Diagnosis Date Noted   Malnutrition of moderate degree 09/15/2022   ESRD (end stage renal disease) (HCC) 09/13/2022   Influenza A 09/12/2022   Acute renal failure superimposed on stage 5 chronic kidney disease, not on chronic dialysis (HCC) 08/07/2022   Constipation 08/07/2022   Paget's disease of bone 08/06/2022   Lower GI bleed 08/05/2022   CKD (chronic kidney disease) stage 5, GFR less than 15 ml/min (HCC) 08/05/2022   Essential hypertension 08/05/2022   Hyperlipidemia 08/05/2022   History of DVT (deep vein thrombosis) 08/05/2022   Hypothyroidism 08/05/2022   DM2 (diabetes mellitus, type 2) (HCC) 08/05/2022   Diarrhea 08/05/2022   Chronic diastolic CHF (congestive heart failure) (HCC) 08/05/2022   Cardiomegaly 08/05/2022    PCP: no PCP   REFERRING PROVIDER: Warren Lacy, MD  REFERRING DIAG:  Diagnosis  M25.551  (ICD-10-CM) - Pain in right hip  M25.561 (ICD-10-CM) - Pain in right knee    THERAPY DIAG:  Pain in right hip  Chronic pain of right knee  Difficulty in walking, not elsewhere classified  Unsteadiness on feet  Rationale for Evaluation and Treatment: Rehabilitation  ONSET DATE: September this year   SUBJECTIVE:   SUBJECTIVE STATEMENT:  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 I've always had OA. I have a cane and a rollator, walked 1300 steps with the walker and it made my knee hurt. Knees hurt a lot with transfers after standing up. Have a tendency to favor the right side.   Per family: she won't use the walker regularly and "is very stubborn". She gets off balance a lot. Furniture walks a lot and has issues with unsteadiness especially when turning quickly.   PERTINENT HISTORY: See above  PAIN:  Are you having pain? No 0/10 now, at absolute worst can get to 8/10   PRECAUTIONS: Fall and Other: HD port L UE, hx of Paget's disease    RED FLAGS: None   WEIGHT BEARING  RESTRICTIONS: No  FALLS:  Has patient fallen in last 6 months? No; lots of close calls per family, no FOF from pt   LIVING ENVIRONMENT: Lives with: lives with their family Lives in: House/apartment Stairs: "a few to get in from garage with U rail" Has following equipment at home: Single point cane, Walker - 4 wheeled, and shower chair  OCCUPATION: retired   PLOF: Independent, Independent with basic ADLs, Independent with gait, and Independent with transfers  PATIENT GOALS: less pain overall, improve mobility   NEXT MD VISIT: Referring PRN   OBJECTIVE:  Note: Objective measures were completed at Evaluation unless otherwise noted.    PATIENT SURVEYS:  FOTO 33, predicted 51 in 13 visits   COGNITION: Overall cognitive status: Within functional limits for tasks  assessed       LOWER EXTREMITY ROM:  Active ROM Right eval Left eval  Hip flexion    Hip extension    Hip abduction    Hip adduction    Hip internal rotation    Hip external rotation    Knee flexion 6*   Knee extension 105*   Ankle dorsiflexion    Ankle plantarflexion    Ankle inversion    Ankle eversion     (Blank rows = not tested)  LOWER EXTREMITY MMT:  MMT Right eval Left eval  Hip flexion 3 3+  Hip extension    Hip abduction 3- 3-  Hip adduction    Hip internal rotation    Hip external rotation    Knee flexion 4- 5  Knee extension 4+ 5  Ankle dorsiflexion 5 5  Ankle plantarflexion    Ankle inversion    Ankle eversion     (Blank rows = not tested)   FUNCTIONAL TESTS:  5 times sit to stand: 12.9 seconds with BUEs  Timed up and go (TUG): 14.5 seconds no device, MinA from PT on turn   GAIT: Distance walked: in clinic distances  Assistive device utilized: None Level of assistance: Complete Independence Comments: scissoring pattern, favors R LE with antalgic pattern, limited ROM R knee with swing/stance phases, R foot flat    TODAY'S TREATMENT:                                                                                                                              DATE:   Eval- exam, POC, HEP   LAQs yellow TB x5 B HS curls yellow TB x5 B Seated clams yellow TB x5  Seated marches yellow TB x5 B       PATIENT EDUCATION:  Education details: exam findings, POC, HEP, importance of strengthening and exercise in managing pain, recommended use of AD preferably RW  Person educated: Patient and Child(ren) Education method: Medical illustrator Education comprehension: verbalized understanding, returned demonstration, and tactile cues required  HOME EXERCISE PROGRAM: Access Code: [email or web address] URL: [email or web address] Date: 08/14/2023 Prepared by: Nedra Hai  Exercises - Seated Knee Extension with Resistance  -  1 x daily -  7 x weekly - 2 sets - 10 reps - Seated Hamstring Curls with Resistance  - 1 x daily - 7 x weekly - 2 sets - 10 reps - Seated Hip Abduction with Resistance  - 1 x daily - 7 x weekly - 2 sets - 10 reps - Seated Knee Lifts with Resistance  - 1 x daily - 7 x weekly - 2 sets - 10 reps  ASSESSMENT:  CLINICAL IMPRESSION: Patient is a 89 y.o. F who was seen today for physical therapy evaluation and treatment for  Diagnosis  M25.551 (ICD-10-CM) - Pain in right hip  M25.561 (ICD-10-CM) - Pain in right knee  . Objective findings as above. Will really benefit from skilled PT services to address functional impairments and reduce fall risk/optimize mobility moving forward.   OBJECTIVE IMPAIRMENTS: Abnormal gait, decreased activity tolerance, decreased balance, decreased knowledge of use of DME, decreased mobility, difficulty walking, decreased ROM, decreased strength, decreased safety awareness, and pain.   ACTIVITY LIMITATIONS: sitting, standing, squatting, sleeping, stairs, transfers, bed mobility, hygiene/grooming, and locomotion level  PARTICIPATION LIMITATIONS: driving, shopping, community activity, yard work, and church  PERSONAL FACTORS: Age, Behavior pattern, Education, Fitness, Past/current experiences, Social background, and Time since onset of injury/illness/exacerbation are also affecting patient's functional outcome.   REHAB POTENTIAL: Fair chronicity of pain/ buy in to PT/exercise   CLINICAL DECISION MAKING: Stable/uncomplicated  EVALUATION COMPLEXITY: Low   GOALS: Goals reviewed with patient? No  SHORT TERM GOALS: Target date: 09/11/2023   Will be compliant with appropriate progressive HEP with no more than MinA from family Baseline: Goal status: INITIAL  2.  Will consistently use appropriate AD with good technique to reduce fall risk and pressure/stress on painful R LE  Baseline:  Goal status: INITIAL  3.  Pain R hip and knee to be no more than 6/10 at worst  Baseline:   Goal status: INITIAL  4.  Gait pattern to have normalized with resolution of "favoring" pattern R LE  Baseline:  Goal status: INITIAL    LONG TERM GOALS: Target date: 10/09/2023    MMT to have improved by at least 1 grade in all weak groups  Baseline:  Goal status: INITIAL  2.  Will be able to complete TUG test in 12 seconds or less no AD, distant S  Baseline:  Goal status: INITIAL  3.  Will be able to ambulate community distances with LRAD and no increase from resting pain levels  Baseline:  Goal status: INITIAL  4.  Pain R LE to be no more than 4/10 at worst  Baseline:  Goal status: INITIAL  5.  Will be able ascend/descend steps with U rail on a Mod(I) basis with no unsteadiness or increased pain R LE  Baseline:  Goal status: INITIAL  6.  FOTO score to be within 5 points of predicted by time of DC  Baseline:  Goal status: INITIAL   PLAN:  PT FREQUENCY: 1-2x/week  PT DURATION: 8 weeks  PLANNED INTERVENTIONS: 97110-Therapeutic exercises, 97530- Therapeutic activity, [id number]- Neuromuscular re-education, 97535- Self Care, [id number]- Manual therapy, and 97116- Gait training  PLAN FOR NEXT SESSION: gentle progression of strength and balance as able and tolerated  Nedra Hai, PT, DPT 08/14/23 2:44 PM

## 2023-08-15 DIAGNOSIS — N186 End stage renal disease: Secondary | ICD-10-CM | POA: Diagnosis not present

## 2023-08-15 DIAGNOSIS — N2581 Secondary hyperparathyroidism of renal origin: Secondary | ICD-10-CM | POA: Diagnosis not present

## 2023-08-15 DIAGNOSIS — Z992 Dependence on renal dialysis: Secondary | ICD-10-CM | POA: Diagnosis not present

## 2023-08-15 NOTE — Therapy (Signed)
OUTPATIENT PHYSICAL THERAPY LOWER EXTREMITY TREATMENT   Patient Name: Norma Coleman MRN: [id number] DOB:04-14-1933, 89 y.o., female Today's Date: 08/16/2023  END OF SESSION:  PT End of Session - 08/16/23 1548     Visit Number 2    Number of Visits 17    Date for PT Re-Evaluation 10/09/23    Authorization Type [employer] MCR    Authorization Time Period 08/14/23 to 10/09/23    Progress Note Due on Visit 10    PT Start Time 1545    PT Stop Time 1630    PT Time Calculation (min) 45 min    Activity Tolerance Patient tolerated treatment well    Behavior During Therapy WFL for tasks assessed/performed              Past Medical History:  Diagnosis Date   DVT (deep venous thrombosis) (HCC)    GI bleed due to NSAIDs    High cholesterol    Hypertension    Hyperthyroidism    Pneumonia    Skin cancer    Thyroid disease    Past Surgical History:  Procedure Laterality Date   IR FLUORO GUIDE CV LINE RIGHT  09/18/2022   IR US GUIDE VASC ACCESS RIGHT  09/18/2022   KNEE SURGERY     TONSILLECTOMY     TUBAL LIGATION     Patient Active Problem List   Diagnosis Date Noted   Malnutrition of moderate degree 09/15/2022   ESRD (end stage renal disease) (HCC) 09/13/2022   Influenza A 09/12/2022   Acute renal failure superimposed on stage 5 chronic kidney disease, not on chronic dialysis (HCC) 08/07/2022   Constipation 08/07/2022   Paget's disease of bone 08/06/2022   Lower GI bleed 08/05/2022   CKD (chronic kidney disease) stage 5, GFR less than 15 ml/min (HCC) 08/05/2022   Essential hypertension 08/05/2022   Hyperlipidemia 08/05/2022   History of DVT (deep vein thrombosis) 08/05/2022   Hypothyroidism 08/05/2022   DM2 (diabetes mellitus, type 2) (HCC) 08/05/2022   Diarrhea 08/05/2022   Chronic diastolic CHF (congestive heart failure) (HCC) 08/05/2022   Cardiomegaly 08/05/2022    PCP: no PCP   REFERRING PROVIDER: Warren Lacy, MD  REFERRING DIAG:  Diagnosis  M25.551  (ICD-10-CM) - Pain in right hip  M25.561 (ICD-10-CM) - Pain in right knee    THERAPY DIAG:  Pain in right hip  Chronic pain of right knee  Difficulty in walking, not elsewhere classified  Unsteadiness on feet  Rationale for Evaluation and Treatment: Rehabilitation  ONSET DATE: September this year   SUBJECTIVE:   SUBJECTIVE STATEMENT:  The pain wakes me up sometimes at night. Earlier today my knee was a 6/10 but now it is better.    Per family: she won't use the walker regularly and "is very stubborn". She gets off balance a lot. Furniture walks a lot and has issues with unsteadiness especially when turning quickly.   PERTINENT HISTORY: See above  PAIN:  Are you having pain? No 6 earlier today, it is fine now/10   PRECAUTIONS: Fall and Other: HD port L UE, hx of Paget's disease    RED FLAGS: None   WEIGHT BEARING RESTRICTIONS: No  FALLS:  Has patient fallen in last 6 months? No; lots of close calls per family, no FOF from pt   LIVING ENVIRONMENT: Lives with: lives with their family Lives in: House/apartment Stairs: "a few to get in from garage with U rail" Has following equipment at home: Single point cane, Walker -  4 wheeled, and shower chair  OCCUPATION: retired   PLOF: Independent, Independent with basic ADLs, Independent with gait, and Independent with transfers  PATIENT GOALS: less pain overall, improve mobility   NEXT MD VISIT: Referring PRN   OBJECTIVE:  Note: Objective measures were completed at Evaluation unless otherwise noted.    PATIENT SURVEYS:  FOTO 33, predicted 51 in 13 visits   COGNITION: Overall cognitive status: Within functional limits for tasks assessed       LOWER EXTREMITY ROM:  Active ROM Right eval Left eval  Hip flexion    Hip extension    Hip abduction    Hip adduction    Hip internal rotation    Hip external rotation    Knee flexion 6*   Knee extension 105*   Ankle dorsiflexion    Ankle plantarflexion     Ankle inversion    Ankle eversion     (Blank rows = not tested)  LOWER EXTREMITY MMT:  MMT Right eval Left eval  Hip flexion 3 3+  Hip extension    Hip abduction 3- 3-  Hip adduction    Hip internal rotation    Hip external rotation    Knee flexion 4- 5  Knee extension 4+ 5  Ankle dorsiflexion 5 5  Ankle plantarflexion    Ankle inversion    Ankle eversion     (Blank rows = not tested)   FUNCTIONAL TESTS:  5 times sit to stand: 12.9 seconds with BUEs  Timed up and go (TUG): 14.5 seconds no device, MinA from PT on turn   GAIT: Distance walked: in clinic distances  Assistive device utilized: None Level of assistance: Complete Independence Comments: scissoring pattern, favors R LE with antalgic pattern, limited ROM R knee with swing/stance phases, R foot flat    TODAY'S TREATMENT:                                                                                                                              DATE:  08/16/23 NuStep L5x52mins  Seated marches 20 reps alternating  STS 2x10 LAQ 2# 2x10  Standing hip abduction 2# 2x10 HS curls [place] 2x10 Ball squeezes 2x10   Eval- exam, POC, HEP   LAQs yellow TB x5 B HS curls yellow TB x5 B Seated clams yellow TB x5  Seated marches yellow TB x5 B       PATIENT EDUCATION:  Education details: exam findings, POC, HEP, importance of strengthening and exercise in managing pain, recommended use of AD preferably RW  Person educated: Patient and Child(ren) Education method: Medical illustrator Education comprehension: verbalized understanding, returned demonstration, and tactile cues required  HOME EXERCISE PROGRAM: Access Code: [email or web address] URL: [email or web address] Date: 08/14/2023 Prepared by: Nedra Hai  Exercises - Seated Knee Extension with Resistance  - 1 x daily - 7 x weekly - 2 sets - 10 reps - Seated Hamstring Curls with Resistance  - 1 x daily - 7  x weekly - 2 sets - 10 reps - Seated  Hip Abduction with Resistance  - 1 x daily - 7 x weekly - 2 sets - 10 reps - Seated Knee Lifts with Resistance  - 1 x daily - 7 x weekly - 2 sets - 10 reps  ASSESSMENT:  CLINICAL IMPRESSION: Patient is a 89 y.o. F who was seen today for physical therapy treatment for pain in R hip and knee. Started with some light gym activities to work on gentle strengthening for knees and hips. Reports standing hip abduction was her least favorite as it caused some pain especially when having to put all her weight on the RLE. Will really benefit from skilled PT services to address functional impairments and reduce fall risk/optimize mobility moving forward.   OBJECTIVE IMPAIRMENTS: Abnormal gait, decreased activity tolerance, decreased balance, decreased knowledge of use of DME, decreased mobility, difficulty walking, decreased ROM, decreased strength, decreased safety awareness, and pain.   ACTIVITY LIMITATIONS: sitting, standing, squatting, sleeping, stairs, transfers, bed mobility, hygiene/grooming, and locomotion level  PARTICIPATION LIMITATIONS: driving, shopping, community activity, yard work, and church  PERSONAL FACTORS: Age, Behavior pattern, Education, Fitness, Past/current experiences, Social background, and Time since onset of injury/illness/exacerbation are also affecting patient's functional outcome.   REHAB POTENTIAL: Fair chronicity of pain/ buy in to PT/exercise   CLINICAL DECISION MAKING: Stable/uncomplicated  EVALUATION COMPLEXITY: Low   GOALS: Goals reviewed with patient? No  SHORT TERM GOALS: Target date: 09/11/2023   Will be compliant with appropriate progressive HEP with no more than MinA from family Baseline: Goal status: INITIAL  2.  Will consistently use appropriate AD with good technique to reduce fall risk and pressure/stress on painful R LE  Baseline:  Goal status: INITIAL  3.  Pain R hip and knee to be no more than 6/10 at worst  Baseline:  Goal status:  INITIAL  4.  Gait pattern to have normalized with resolution of "favoring" pattern R LE  Baseline:  Goal status: INITIAL    LONG TERM GOALS: Target date: 10/09/2023    MMT to have improved by at least 1 grade in all weak groups  Baseline:  Goal status: INITIAL  2.  Will be able to complete TUG test in 12 seconds or less no AD, distant S  Baseline:  Goal status: INITIAL  3.  Will be able to ambulate community distances with LRAD and no increase from resting pain levels  Baseline:  Goal status: INITIAL  4.  Pain R LE to be no more than 4/10 at worst  Baseline:  Goal status: INITIAL  5.  Will be able ascend/descend steps with U rail on a Mod(I) basis with no unsteadiness or increased pain R LE  Baseline:  Goal status: INITIAL  6.  FOTO score to be within 5 points of predicted by time of DC  Baseline:  Goal status: INITIAL   PLAN:  PT FREQUENCY: 1-2x/week  PT DURATION: 8 weeks  PLANNED INTERVENTIONS: 97110-Therapeutic exercises, 97530- Therapeutic activity, [id number]- Neuromuscular re-education, 97535- Self Care, 16109- Manual therapy, and 97116- Gait training  PLAN FOR NEXT SESSION: gentle progression of strength and balance as able and tolerated  Cassie Freer, PT, DPT 08/16/23 4:28 PM

## 2023-08-16 ENCOUNTER — Ambulatory Visit: Payer: Medicare PPO

## 2023-08-16 DIAGNOSIS — R262 Difficulty in walking, not elsewhere classified: Secondary | ICD-10-CM

## 2023-08-16 DIAGNOSIS — R2681 Unsteadiness on feet: Secondary | ICD-10-CM

## 2023-08-16 DIAGNOSIS — M25561 Pain in right knee: Secondary | ICD-10-CM | POA: Diagnosis not present

## 2023-08-16 DIAGNOSIS — G8929 Other chronic pain: Secondary | ICD-10-CM | POA: Diagnosis not present

## 2023-08-16 DIAGNOSIS — M25551 Pain in right hip: Secondary | ICD-10-CM | POA: Diagnosis not present

## 2023-08-17 DIAGNOSIS — N2581 Secondary hyperparathyroidism of renal origin: Secondary | ICD-10-CM | POA: Diagnosis not present

## 2023-08-17 DIAGNOSIS — Z992 Dependence on renal dialysis: Secondary | ICD-10-CM | POA: Diagnosis not present

## 2023-08-17 DIAGNOSIS — N186 End stage renal disease: Secondary | ICD-10-CM | POA: Diagnosis not present

## 2023-08-20 DIAGNOSIS — N2581 Secondary hyperparathyroidism of renal origin: Secondary | ICD-10-CM | POA: Diagnosis not present

## 2023-08-20 DIAGNOSIS — N186 End stage renal disease: Secondary | ICD-10-CM | POA: Diagnosis not present

## 2023-08-20 DIAGNOSIS — Z992 Dependence on renal dialysis: Secondary | ICD-10-CM | POA: Diagnosis not present

## 2023-08-22 DIAGNOSIS — N2581 Secondary hyperparathyroidism of renal origin: Secondary | ICD-10-CM | POA: Diagnosis not present

## 2023-08-22 DIAGNOSIS — N186 End stage renal disease: Secondary | ICD-10-CM | POA: Diagnosis not present

## 2023-08-22 DIAGNOSIS — Z992 Dependence on renal dialysis: Secondary | ICD-10-CM | POA: Diagnosis not present

## 2023-08-24 DIAGNOSIS — N2581 Secondary hyperparathyroidism of renal origin: Secondary | ICD-10-CM | POA: Diagnosis not present

## 2023-08-24 DIAGNOSIS — N186 End stage renal disease: Secondary | ICD-10-CM | POA: Diagnosis not present

## 2023-08-24 DIAGNOSIS — Z992 Dependence on renal dialysis: Secondary | ICD-10-CM | POA: Diagnosis not present

## 2023-08-28 ENCOUNTER — Ambulatory Visit: Payer: Medicare PPO | Admitting: Physical Therapy

## 2023-08-29 DIAGNOSIS — N186 End stage renal disease: Secondary | ICD-10-CM | POA: Diagnosis not present

## 2023-08-29 DIAGNOSIS — N2581 Secondary hyperparathyroidism of renal origin: Secondary | ICD-10-CM | POA: Diagnosis not present

## 2023-08-29 DIAGNOSIS — Z992 Dependence on renal dialysis: Secondary | ICD-10-CM | POA: Diagnosis not present

## 2023-08-30 ENCOUNTER — Ambulatory Visit: Payer: Medicare PPO | Admitting: Physical Therapy

## 2023-08-31 DIAGNOSIS — N2581 Secondary hyperparathyroidism of renal origin: Secondary | ICD-10-CM | POA: Diagnosis not present

## 2023-08-31 DIAGNOSIS — N186 End stage renal disease: Secondary | ICD-10-CM | POA: Diagnosis not present

## 2023-08-31 DIAGNOSIS — Z992 Dependence on renal dialysis: Secondary | ICD-10-CM | POA: Diagnosis not present

## 2023-09-02 DIAGNOSIS — Z992 Dependence on renal dialysis: Secondary | ICD-10-CM | POA: Diagnosis not present

## 2023-09-02 DIAGNOSIS — N2581 Secondary hyperparathyroidism of renal origin: Secondary | ICD-10-CM | POA: Diagnosis not present

## 2023-09-02 DIAGNOSIS — N186 End stage renal disease: Secondary | ICD-10-CM | POA: Diagnosis not present

## 2023-09-03 DIAGNOSIS — Z992 Dependence on renal dialysis: Secondary | ICD-10-CM | POA: Diagnosis not present

## 2023-09-03 DIAGNOSIS — N186 End stage renal disease: Secondary | ICD-10-CM | POA: Diagnosis not present

## 2023-09-03 DIAGNOSIS — N2581 Secondary hyperparathyroidism of renal origin: Secondary | ICD-10-CM | POA: Diagnosis not present

## 2023-09-07 DIAGNOSIS — Z992 Dependence on renal dialysis: Secondary | ICD-10-CM | POA: Diagnosis not present

## 2023-09-07 DIAGNOSIS — N2581 Secondary hyperparathyroidism of renal origin: Secondary | ICD-10-CM | POA: Diagnosis not present

## 2023-09-07 DIAGNOSIS — N186 End stage renal disease: Secondary | ICD-10-CM | POA: Diagnosis not present

## 2023-09-09 DIAGNOSIS — N186 End stage renal disease: Secondary | ICD-10-CM | POA: Diagnosis not present

## 2023-09-09 DIAGNOSIS — Z992 Dependence on renal dialysis: Secondary | ICD-10-CM | POA: Diagnosis not present

## 2023-09-09 DIAGNOSIS — N2581 Secondary hyperparathyroidism of renal origin: Secondary | ICD-10-CM | POA: Diagnosis not present

## 2023-09-10 ENCOUNTER — Other Ambulatory Visit: Payer: Self-pay | Admitting: Student

## 2023-09-10 NOTE — Telephone Encounter (Signed)
NOT [facility]

## 2023-09-11 DIAGNOSIS — E1122 Type 2 diabetes mellitus with diabetic chronic kidney disease: Secondary | ICD-10-CM | POA: Diagnosis not present

## 2023-09-11 DIAGNOSIS — Z992 Dependence on renal dialysis: Secondary | ICD-10-CM | POA: Diagnosis not present

## 2023-09-11 DIAGNOSIS — N186 End stage renal disease: Secondary | ICD-10-CM | POA: Diagnosis not present

## 2023-09-11 DIAGNOSIS — N2581 Secondary hyperparathyroidism of renal origin: Secondary | ICD-10-CM | POA: Diagnosis not present

## 2023-09-14 DIAGNOSIS — N186 End stage renal disease: Secondary | ICD-10-CM | POA: Diagnosis not present

## 2023-09-14 DIAGNOSIS — N2581 Secondary hyperparathyroidism of renal origin: Secondary | ICD-10-CM | POA: Diagnosis not present

## 2023-09-14 DIAGNOSIS — Z992 Dependence on renal dialysis: Secondary | ICD-10-CM | POA: Diagnosis not present

## 2023-09-17 DIAGNOSIS — N2581 Secondary hyperparathyroidism of renal origin: Secondary | ICD-10-CM | POA: Diagnosis not present

## 2023-09-17 DIAGNOSIS — Z992 Dependence on renal dialysis: Secondary | ICD-10-CM | POA: Diagnosis not present

## 2023-09-17 DIAGNOSIS — N186 End stage renal disease: Secondary | ICD-10-CM | POA: Diagnosis not present

## 2023-09-19 DIAGNOSIS — N186 End stage renal disease: Secondary | ICD-10-CM | POA: Diagnosis not present

## 2023-09-19 DIAGNOSIS — N2581 Secondary hyperparathyroidism of renal origin: Secondary | ICD-10-CM | POA: Diagnosis not present

## 2023-09-19 DIAGNOSIS — Z992 Dependence on renal dialysis: Secondary | ICD-10-CM | POA: Diagnosis not present

## 2023-09-21 DIAGNOSIS — N186 End stage renal disease: Secondary | ICD-10-CM | POA: Diagnosis not present

## 2023-09-21 DIAGNOSIS — N2581 Secondary hyperparathyroidism of renal origin: Secondary | ICD-10-CM | POA: Diagnosis not present

## 2023-09-21 DIAGNOSIS — Z992 Dependence on renal dialysis: Secondary | ICD-10-CM | POA: Diagnosis not present

## 2023-09-24 DIAGNOSIS — N2581 Secondary hyperparathyroidism of renal origin: Secondary | ICD-10-CM | POA: Diagnosis not present

## 2023-09-24 DIAGNOSIS — Z992 Dependence on renal dialysis: Secondary | ICD-10-CM | POA: Diagnosis not present

## 2023-09-24 DIAGNOSIS — N186 End stage renal disease: Secondary | ICD-10-CM | POA: Diagnosis not present

## 2023-09-26 DIAGNOSIS — N186 End stage renal disease: Secondary | ICD-10-CM | POA: Diagnosis not present

## 2023-09-26 DIAGNOSIS — N2581 Secondary hyperparathyroidism of renal origin: Secondary | ICD-10-CM | POA: Diagnosis not present

## 2023-09-26 DIAGNOSIS — Z992 Dependence on renal dialysis: Secondary | ICD-10-CM | POA: Diagnosis not present

## 2023-09-28 DIAGNOSIS — N2581 Secondary hyperparathyroidism of renal origin: Secondary | ICD-10-CM | POA: Diagnosis not present

## 2023-09-28 DIAGNOSIS — N186 End stage renal disease: Secondary | ICD-10-CM | POA: Diagnosis not present

## 2023-09-28 DIAGNOSIS — Z992 Dependence on renal dialysis: Secondary | ICD-10-CM | POA: Diagnosis not present

## 2023-10-01 DIAGNOSIS — Z992 Dependence on renal dialysis: Secondary | ICD-10-CM | POA: Diagnosis not present

## 2023-10-01 DIAGNOSIS — N2581 Secondary hyperparathyroidism of renal origin: Secondary | ICD-10-CM | POA: Diagnosis not present

## 2023-10-01 DIAGNOSIS — N186 End stage renal disease: Secondary | ICD-10-CM | POA: Diagnosis not present

## 2023-10-03 DIAGNOSIS — N2581 Secondary hyperparathyroidism of renal origin: Secondary | ICD-10-CM | POA: Diagnosis not present

## 2023-10-03 DIAGNOSIS — Z992 Dependence on renal dialysis: Secondary | ICD-10-CM | POA: Diagnosis not present

## 2023-10-03 DIAGNOSIS — N186 End stage renal disease: Secondary | ICD-10-CM | POA: Diagnosis not present

## 2023-10-05 DIAGNOSIS — Z992 Dependence on renal dialysis: Secondary | ICD-10-CM | POA: Diagnosis not present

## 2023-10-05 DIAGNOSIS — N2581 Secondary hyperparathyroidism of renal origin: Secondary | ICD-10-CM | POA: Diagnosis not present

## 2023-10-05 DIAGNOSIS — N186 End stage renal disease: Secondary | ICD-10-CM | POA: Diagnosis not present

## 2023-10-08 DIAGNOSIS — Z992 Dependence on renal dialysis: Secondary | ICD-10-CM | POA: Diagnosis not present

## 2023-10-08 DIAGNOSIS — N2581 Secondary hyperparathyroidism of renal origin: Secondary | ICD-10-CM | POA: Diagnosis not present

## 2023-10-08 DIAGNOSIS — N186 End stage renal disease: Secondary | ICD-10-CM | POA: Diagnosis not present

## 2023-10-10 DIAGNOSIS — N2581 Secondary hyperparathyroidism of renal origin: Secondary | ICD-10-CM | POA: Diagnosis not present

## 2023-10-10 DIAGNOSIS — N186 End stage renal disease: Secondary | ICD-10-CM | POA: Diagnosis not present

## 2023-10-10 DIAGNOSIS — Z992 Dependence on renal dialysis: Secondary | ICD-10-CM | POA: Diagnosis not present

## 2023-10-12 DIAGNOSIS — E1122 Type 2 diabetes mellitus with diabetic chronic kidney disease: Secondary | ICD-10-CM | POA: Diagnosis not present

## 2023-10-12 DIAGNOSIS — N186 End stage renal disease: Secondary | ICD-10-CM | POA: Diagnosis not present

## 2023-10-12 DIAGNOSIS — Z992 Dependence on renal dialysis: Secondary | ICD-10-CM | POA: Diagnosis not present

## 2023-10-12 DIAGNOSIS — N2581 Secondary hyperparathyroidism of renal origin: Secondary | ICD-10-CM | POA: Diagnosis not present

## 2023-10-15 DIAGNOSIS — N2581 Secondary hyperparathyroidism of renal origin: Secondary | ICD-10-CM | POA: Diagnosis not present

## 2023-10-15 DIAGNOSIS — Z992 Dependence on renal dialysis: Secondary | ICD-10-CM | POA: Diagnosis not present

## 2023-10-15 DIAGNOSIS — N186 End stage renal disease: Secondary | ICD-10-CM | POA: Diagnosis not present

## 2023-10-17 DIAGNOSIS — N2581 Secondary hyperparathyroidism of renal origin: Secondary | ICD-10-CM | POA: Diagnosis not present

## 2023-10-17 DIAGNOSIS — N186 End stage renal disease: Secondary | ICD-10-CM | POA: Diagnosis not present

## 2023-10-17 DIAGNOSIS — Z992 Dependence on renal dialysis: Secondary | ICD-10-CM | POA: Diagnosis not present

## 2023-10-19 DIAGNOSIS — Z992 Dependence on renal dialysis: Secondary | ICD-10-CM | POA: Diagnosis not present

## 2023-10-19 DIAGNOSIS — N186 End stage renal disease: Secondary | ICD-10-CM | POA: Diagnosis not present

## 2023-10-19 DIAGNOSIS — N2581 Secondary hyperparathyroidism of renal origin: Secondary | ICD-10-CM | POA: Diagnosis not present

## 2023-10-22 DIAGNOSIS — Z992 Dependence on renal dialysis: Secondary | ICD-10-CM | POA: Diagnosis not present

## 2023-10-22 DIAGNOSIS — N186 End stage renal disease: Secondary | ICD-10-CM | POA: Diagnosis not present

## 2023-10-22 DIAGNOSIS — N2581 Secondary hyperparathyroidism of renal origin: Secondary | ICD-10-CM | POA: Diagnosis not present

## 2023-10-24 DIAGNOSIS — Z992 Dependence on renal dialysis: Secondary | ICD-10-CM | POA: Diagnosis not present

## 2023-10-24 DIAGNOSIS — N2581 Secondary hyperparathyroidism of renal origin: Secondary | ICD-10-CM | POA: Diagnosis not present

## 2023-10-24 DIAGNOSIS — N186 End stage renal disease: Secondary | ICD-10-CM | POA: Diagnosis not present

## 2023-10-26 DIAGNOSIS — N2581 Secondary hyperparathyroidism of renal origin: Secondary | ICD-10-CM | POA: Diagnosis not present

## 2023-10-26 DIAGNOSIS — N186 End stage renal disease: Secondary | ICD-10-CM | POA: Diagnosis not present

## 2023-10-26 DIAGNOSIS — Z992 Dependence on renal dialysis: Secondary | ICD-10-CM | POA: Diagnosis not present

## 2023-10-29 DIAGNOSIS — Z992 Dependence on renal dialysis: Secondary | ICD-10-CM | POA: Diagnosis not present

## 2023-10-29 DIAGNOSIS — N186 End stage renal disease: Secondary | ICD-10-CM | POA: Diagnosis not present

## 2023-10-29 DIAGNOSIS — N2581 Secondary hyperparathyroidism of renal origin: Secondary | ICD-10-CM | POA: Diagnosis not present

## 2023-10-31 DIAGNOSIS — N2581 Secondary hyperparathyroidism of renal origin: Secondary | ICD-10-CM | POA: Diagnosis not present

## 2023-10-31 DIAGNOSIS — Z992 Dependence on renal dialysis: Secondary | ICD-10-CM | POA: Diagnosis not present

## 2023-10-31 DIAGNOSIS — N186 End stage renal disease: Secondary | ICD-10-CM | POA: Diagnosis not present

## 2023-11-02 DIAGNOSIS — Z992 Dependence on renal dialysis: Secondary | ICD-10-CM | POA: Diagnosis not present

## 2023-11-02 DIAGNOSIS — N186 End stage renal disease: Secondary | ICD-10-CM | POA: Diagnosis not present

## 2023-11-02 DIAGNOSIS — N2581 Secondary hyperparathyroidism of renal origin: Secondary | ICD-10-CM | POA: Diagnosis not present

## 2023-11-05 DIAGNOSIS — Z992 Dependence on renal dialysis: Secondary | ICD-10-CM | POA: Diagnosis not present

## 2023-11-05 DIAGNOSIS — N2581 Secondary hyperparathyroidism of renal origin: Secondary | ICD-10-CM | POA: Diagnosis not present

## 2023-11-05 DIAGNOSIS — N186 End stage renal disease: Secondary | ICD-10-CM | POA: Diagnosis not present

## 2023-11-07 DIAGNOSIS — Z992 Dependence on renal dialysis: Secondary | ICD-10-CM | POA: Diagnosis not present

## 2023-11-07 DIAGNOSIS — N2581 Secondary hyperparathyroidism of renal origin: Secondary | ICD-10-CM | POA: Diagnosis not present

## 2023-11-07 DIAGNOSIS — N186 End stage renal disease: Secondary | ICD-10-CM | POA: Diagnosis not present

## 2023-11-09 DIAGNOSIS — E1122 Type 2 diabetes mellitus with diabetic chronic kidney disease: Secondary | ICD-10-CM | POA: Diagnosis not present

## 2023-11-09 DIAGNOSIS — N186 End stage renal disease: Secondary | ICD-10-CM | POA: Diagnosis not present

## 2023-11-09 DIAGNOSIS — Z992 Dependence on renal dialysis: Secondary | ICD-10-CM | POA: Diagnosis not present

## 2023-11-09 DIAGNOSIS — N2581 Secondary hyperparathyroidism of renal origin: Secondary | ICD-10-CM | POA: Diagnosis not present

## 2023-11-12 DIAGNOSIS — N2581 Secondary hyperparathyroidism of renal origin: Secondary | ICD-10-CM | POA: Diagnosis not present

## 2023-11-12 DIAGNOSIS — Z992 Dependence on renal dialysis: Secondary | ICD-10-CM | POA: Diagnosis not present

## 2023-11-12 DIAGNOSIS — N186 End stage renal disease: Secondary | ICD-10-CM | POA: Diagnosis not present

## 2023-11-14 DIAGNOSIS — N2581 Secondary hyperparathyroidism of renal origin: Secondary | ICD-10-CM | POA: Diagnosis not present

## 2023-11-14 DIAGNOSIS — Z992 Dependence on renal dialysis: Secondary | ICD-10-CM | POA: Diagnosis not present

## 2023-11-14 DIAGNOSIS — N186 End stage renal disease: Secondary | ICD-10-CM | POA: Diagnosis not present

## 2023-11-16 DIAGNOSIS — N2581 Secondary hyperparathyroidism of renal origin: Secondary | ICD-10-CM | POA: Diagnosis not present

## 2023-11-16 DIAGNOSIS — N186 End stage renal disease: Secondary | ICD-10-CM | POA: Diagnosis not present

## 2023-11-16 DIAGNOSIS — Z992 Dependence on renal dialysis: Secondary | ICD-10-CM | POA: Diagnosis not present

## 2023-11-19 DIAGNOSIS — N186 End stage renal disease: Secondary | ICD-10-CM | POA: Diagnosis not present

## 2023-11-19 DIAGNOSIS — Z992 Dependence on renal dialysis: Secondary | ICD-10-CM | POA: Diagnosis not present

## 2023-11-19 DIAGNOSIS — N2581 Secondary hyperparathyroidism of renal origin: Secondary | ICD-10-CM | POA: Diagnosis not present

## 2023-11-21 DIAGNOSIS — N2581 Secondary hyperparathyroidism of renal origin: Secondary | ICD-10-CM | POA: Diagnosis not present

## 2023-11-21 DIAGNOSIS — Z992 Dependence on renal dialysis: Secondary | ICD-10-CM | POA: Diagnosis not present

## 2023-11-21 DIAGNOSIS — N186 End stage renal disease: Secondary | ICD-10-CM | POA: Diagnosis not present

## 2023-11-22 DIAGNOSIS — E782 Mixed hyperlipidemia: Secondary | ICD-10-CM | POA: Diagnosis not present

## 2023-11-22 DIAGNOSIS — E039 Hypothyroidism, unspecified: Secondary | ICD-10-CM | POA: Diagnosis not present

## 2023-11-22 DIAGNOSIS — Z992 Dependence on renal dialysis: Secondary | ICD-10-CM | POA: Diagnosis not present

## 2023-11-22 DIAGNOSIS — D631 Anemia in chronic kidney disease: Secondary | ICD-10-CM | POA: Diagnosis not present

## 2023-11-22 DIAGNOSIS — R748 Abnormal levels of other serum enzymes: Secondary | ICD-10-CM | POA: Diagnosis not present

## 2023-11-22 DIAGNOSIS — N186 End stage renal disease: Secondary | ICD-10-CM | POA: Diagnosis not present

## 2023-11-23 DIAGNOSIS — N2581 Secondary hyperparathyroidism of renal origin: Secondary | ICD-10-CM | POA: Diagnosis not present

## 2023-11-23 DIAGNOSIS — Z992 Dependence on renal dialysis: Secondary | ICD-10-CM | POA: Diagnosis not present

## 2023-11-23 DIAGNOSIS — N186 End stage renal disease: Secondary | ICD-10-CM | POA: Diagnosis not present

## 2023-11-26 DIAGNOSIS — Z992 Dependence on renal dialysis: Secondary | ICD-10-CM | POA: Diagnosis not present

## 2023-11-26 DIAGNOSIS — N186 End stage renal disease: Secondary | ICD-10-CM | POA: Diagnosis not present

## 2023-11-26 DIAGNOSIS — N2581 Secondary hyperparathyroidism of renal origin: Secondary | ICD-10-CM | POA: Diagnosis not present

## 2023-11-28 DIAGNOSIS — N186 End stage renal disease: Secondary | ICD-10-CM | POA: Diagnosis not present

## 2023-11-28 DIAGNOSIS — Z992 Dependence on renal dialysis: Secondary | ICD-10-CM | POA: Diagnosis not present

## 2023-11-28 DIAGNOSIS — N2581 Secondary hyperparathyroidism of renal origin: Secondary | ICD-10-CM | POA: Diagnosis not present

## 2023-11-29 DIAGNOSIS — M25551 Pain in right hip: Secondary | ICD-10-CM | POA: Diagnosis not present

## 2023-11-29 DIAGNOSIS — I10 Essential (primary) hypertension: Secondary | ICD-10-CM | POA: Diagnosis not present

## 2023-11-29 DIAGNOSIS — M810 Age-related osteoporosis without current pathological fracture: Secondary | ICD-10-CM | POA: Diagnosis not present

## 2023-11-29 DIAGNOSIS — I340 Nonrheumatic mitral (valve) insufficiency: Secondary | ICD-10-CM | POA: Diagnosis not present

## 2023-11-29 DIAGNOSIS — N186 End stage renal disease: Secondary | ICD-10-CM | POA: Diagnosis not present

## 2023-11-29 DIAGNOSIS — M159 Polyosteoarthritis, unspecified: Secondary | ICD-10-CM | POA: Diagnosis not present

## 2023-11-29 DIAGNOSIS — E039 Hypothyroidism, unspecified: Secondary | ICD-10-CM | POA: Diagnosis not present

## 2023-11-29 DIAGNOSIS — E782 Mixed hyperlipidemia: Secondary | ICD-10-CM | POA: Diagnosis not present

## 2023-11-29 DIAGNOSIS — Z0000 Encounter for general adult medical examination without abnormal findings: Secondary | ICD-10-CM | POA: Diagnosis not present

## 2023-11-30 DIAGNOSIS — Z992 Dependence on renal dialysis: Secondary | ICD-10-CM | POA: Diagnosis not present

## 2023-11-30 DIAGNOSIS — N2581 Secondary hyperparathyroidism of renal origin: Secondary | ICD-10-CM | POA: Diagnosis not present

## 2023-11-30 DIAGNOSIS — N186 End stage renal disease: Secondary | ICD-10-CM | POA: Diagnosis not present

## 2023-12-03 DIAGNOSIS — N186 End stage renal disease: Secondary | ICD-10-CM | POA: Diagnosis not present

## 2023-12-03 DIAGNOSIS — Z992 Dependence on renal dialysis: Secondary | ICD-10-CM | POA: Diagnosis not present

## 2023-12-03 DIAGNOSIS — N2581 Secondary hyperparathyroidism of renal origin: Secondary | ICD-10-CM | POA: Diagnosis not present

## 2023-12-05 DIAGNOSIS — N186 End stage renal disease: Secondary | ICD-10-CM | POA: Diagnosis not present

## 2023-12-05 DIAGNOSIS — N2581 Secondary hyperparathyroidism of renal origin: Secondary | ICD-10-CM | POA: Diagnosis not present

## 2023-12-05 DIAGNOSIS — Z992 Dependence on renal dialysis: Secondary | ICD-10-CM | POA: Diagnosis not present

## 2023-12-07 DIAGNOSIS — N186 End stage renal disease: Secondary | ICD-10-CM | POA: Diagnosis not present

## 2023-12-07 DIAGNOSIS — Z992 Dependence on renal dialysis: Secondary | ICD-10-CM | POA: Diagnosis not present

## 2023-12-07 DIAGNOSIS — N2581 Secondary hyperparathyroidism of renal origin: Secondary | ICD-10-CM | POA: Diagnosis not present

## 2023-12-10 DIAGNOSIS — E1122 Type 2 diabetes mellitus with diabetic chronic kidney disease: Secondary | ICD-10-CM | POA: Diagnosis not present

## 2023-12-10 DIAGNOSIS — N2581 Secondary hyperparathyroidism of renal origin: Secondary | ICD-10-CM | POA: Diagnosis not present

## 2023-12-10 DIAGNOSIS — Z992 Dependence on renal dialysis: Secondary | ICD-10-CM | POA: Diagnosis not present

## 2023-12-10 DIAGNOSIS — N186 End stage renal disease: Secondary | ICD-10-CM | POA: Diagnosis not present

## 2023-12-12 DIAGNOSIS — N186 End stage renal disease: Secondary | ICD-10-CM | POA: Diagnosis not present

## 2023-12-12 DIAGNOSIS — Z992 Dependence on renal dialysis: Secondary | ICD-10-CM | POA: Diagnosis not present

## 2023-12-12 DIAGNOSIS — N2581 Secondary hyperparathyroidism of renal origin: Secondary | ICD-10-CM | POA: Diagnosis not present

## 2023-12-17 DIAGNOSIS — Z992 Dependence on renal dialysis: Secondary | ICD-10-CM | POA: Diagnosis not present

## 2023-12-17 DIAGNOSIS — N2581 Secondary hyperparathyroidism of renal origin: Secondary | ICD-10-CM | POA: Diagnosis not present

## 2023-12-17 DIAGNOSIS — N186 End stage renal disease: Secondary | ICD-10-CM | POA: Diagnosis not present

## 2023-12-19 DIAGNOSIS — N2581 Secondary hyperparathyroidism of renal origin: Secondary | ICD-10-CM | POA: Diagnosis not present

## 2023-12-19 DIAGNOSIS — Z992 Dependence on renal dialysis: Secondary | ICD-10-CM | POA: Diagnosis not present

## 2023-12-19 DIAGNOSIS — N186 End stage renal disease: Secondary | ICD-10-CM | POA: Diagnosis not present

## 2023-12-21 DIAGNOSIS — Z992 Dependence on renal dialysis: Secondary | ICD-10-CM | POA: Diagnosis not present

## 2023-12-21 DIAGNOSIS — N2581 Secondary hyperparathyroidism of renal origin: Secondary | ICD-10-CM | POA: Diagnosis not present

## 2023-12-21 DIAGNOSIS — N186 End stage renal disease: Secondary | ICD-10-CM | POA: Diagnosis not present

## 2023-12-24 DIAGNOSIS — N2581 Secondary hyperparathyroidism of renal origin: Secondary | ICD-10-CM | POA: Diagnosis not present

## 2023-12-24 DIAGNOSIS — N186 End stage renal disease: Secondary | ICD-10-CM | POA: Diagnosis not present

## 2023-12-24 DIAGNOSIS — Z992 Dependence on renal dialysis: Secondary | ICD-10-CM | POA: Diagnosis not present

## 2023-12-26 DIAGNOSIS — N186 End stage renal disease: Secondary | ICD-10-CM | POA: Diagnosis not present

## 2023-12-26 DIAGNOSIS — N2581 Secondary hyperparathyroidism of renal origin: Secondary | ICD-10-CM | POA: Diagnosis not present

## 2023-12-26 DIAGNOSIS — Z992 Dependence on renal dialysis: Secondary | ICD-10-CM | POA: Diagnosis not present

## 2023-12-27 DIAGNOSIS — G8929 Other chronic pain: Secondary | ICD-10-CM | POA: Diagnosis not present

## 2023-12-27 DIAGNOSIS — M25561 Pain in right knee: Secondary | ICD-10-CM | POA: Diagnosis not present

## 2023-12-27 DIAGNOSIS — Z96651 Presence of right artificial knee joint: Secondary | ICD-10-CM | POA: Diagnosis not present

## 2023-12-28 DIAGNOSIS — N186 End stage renal disease: Secondary | ICD-10-CM | POA: Diagnosis not present

## 2023-12-28 DIAGNOSIS — N2581 Secondary hyperparathyroidism of renal origin: Secondary | ICD-10-CM | POA: Diagnosis not present

## 2023-12-28 DIAGNOSIS — Z992 Dependence on renal dialysis: Secondary | ICD-10-CM | POA: Diagnosis not present

## 2023-12-31 DIAGNOSIS — N186 End stage renal disease: Secondary | ICD-10-CM | POA: Diagnosis not present

## 2023-12-31 DIAGNOSIS — N2581 Secondary hyperparathyroidism of renal origin: Secondary | ICD-10-CM | POA: Diagnosis not present

## 2023-12-31 DIAGNOSIS — Z992 Dependence on renal dialysis: Secondary | ICD-10-CM | POA: Diagnosis not present

## 2024-01-02 DIAGNOSIS — Z992 Dependence on renal dialysis: Secondary | ICD-10-CM | POA: Diagnosis not present

## 2024-01-02 DIAGNOSIS — N186 End stage renal disease: Secondary | ICD-10-CM | POA: Diagnosis not present

## 2024-01-02 DIAGNOSIS — N2581 Secondary hyperparathyroidism of renal origin: Secondary | ICD-10-CM | POA: Diagnosis not present

## 2024-01-04 DIAGNOSIS — N2581 Secondary hyperparathyroidism of renal origin: Secondary | ICD-10-CM | POA: Diagnosis not present

## 2024-01-04 DIAGNOSIS — N186 End stage renal disease: Secondary | ICD-10-CM | POA: Diagnosis not present

## 2024-01-04 DIAGNOSIS — Z992 Dependence on renal dialysis: Secondary | ICD-10-CM | POA: Diagnosis not present

## 2024-01-07 DIAGNOSIS — N2581 Secondary hyperparathyroidism of renal origin: Secondary | ICD-10-CM | POA: Diagnosis not present

## 2024-01-07 DIAGNOSIS — Z992 Dependence on renal dialysis: Secondary | ICD-10-CM | POA: Diagnosis not present

## 2024-01-07 DIAGNOSIS — N186 End stage renal disease: Secondary | ICD-10-CM | POA: Diagnosis not present

## 2024-01-09 DIAGNOSIS — N186 End stage renal disease: Secondary | ICD-10-CM | POA: Diagnosis not present

## 2024-01-09 DIAGNOSIS — Z992 Dependence on renal dialysis: Secondary | ICD-10-CM | POA: Diagnosis not present

## 2024-01-09 DIAGNOSIS — E1122 Type 2 diabetes mellitus with diabetic chronic kidney disease: Secondary | ICD-10-CM | POA: Diagnosis not present

## 2024-01-11 DIAGNOSIS — N186 End stage renal disease: Secondary | ICD-10-CM | POA: Diagnosis not present

## 2024-01-11 DIAGNOSIS — Z992 Dependence on renal dialysis: Secondary | ICD-10-CM | POA: Diagnosis not present

## 2024-01-11 DIAGNOSIS — N2581 Secondary hyperparathyroidism of renal origin: Secondary | ICD-10-CM | POA: Diagnosis not present

## 2024-01-14 DIAGNOSIS — N186 End stage renal disease: Secondary | ICD-10-CM | POA: Diagnosis not present

## 2024-01-14 DIAGNOSIS — Z992 Dependence on renal dialysis: Secondary | ICD-10-CM | POA: Diagnosis not present

## 2024-01-14 DIAGNOSIS — N2581 Secondary hyperparathyroidism of renal origin: Secondary | ICD-10-CM | POA: Diagnosis not present

## 2024-01-16 DIAGNOSIS — N2581 Secondary hyperparathyroidism of renal origin: Secondary | ICD-10-CM | POA: Diagnosis not present

## 2024-01-16 DIAGNOSIS — N186 End stage renal disease: Secondary | ICD-10-CM | POA: Diagnosis not present

## 2024-01-16 DIAGNOSIS — Z992 Dependence on renal dialysis: Secondary | ICD-10-CM | POA: Diagnosis not present

## 2024-01-17 DIAGNOSIS — M25561 Pain in right knee: Secondary | ICD-10-CM | POA: Diagnosis not present

## 2024-01-18 DIAGNOSIS — N2581 Secondary hyperparathyroidism of renal origin: Secondary | ICD-10-CM | POA: Diagnosis not present

## 2024-01-18 DIAGNOSIS — N186 End stage renal disease: Secondary | ICD-10-CM | POA: Diagnosis not present

## 2024-01-18 DIAGNOSIS — Z992 Dependence on renal dialysis: Secondary | ICD-10-CM | POA: Diagnosis not present

## 2024-01-21 DIAGNOSIS — Z992 Dependence on renal dialysis: Secondary | ICD-10-CM | POA: Diagnosis not present

## 2024-01-21 DIAGNOSIS — N2581 Secondary hyperparathyroidism of renal origin: Secondary | ICD-10-CM | POA: Diagnosis not present

## 2024-01-21 DIAGNOSIS — N186 End stage renal disease: Secondary | ICD-10-CM | POA: Diagnosis not present

## 2024-01-23 DIAGNOSIS — N2581 Secondary hyperparathyroidism of renal origin: Secondary | ICD-10-CM | POA: Diagnosis not present

## 2024-01-23 DIAGNOSIS — N186 End stage renal disease: Secondary | ICD-10-CM | POA: Diagnosis not present

## 2024-01-23 DIAGNOSIS — Z992 Dependence on renal dialysis: Secondary | ICD-10-CM | POA: Diagnosis not present

## 2024-01-25 DIAGNOSIS — Z992 Dependence on renal dialysis: Secondary | ICD-10-CM | POA: Diagnosis not present

## 2024-01-25 DIAGNOSIS — N2581 Secondary hyperparathyroidism of renal origin: Secondary | ICD-10-CM | POA: Diagnosis not present

## 2024-01-25 DIAGNOSIS — N186 End stage renal disease: Secondary | ICD-10-CM | POA: Diagnosis not present

## 2024-01-28 DIAGNOSIS — N186 End stage renal disease: Secondary | ICD-10-CM | POA: Diagnosis not present

## 2024-01-28 DIAGNOSIS — N2581 Secondary hyperparathyroidism of renal origin: Secondary | ICD-10-CM | POA: Diagnosis not present

## 2024-01-28 DIAGNOSIS — Z992 Dependence on renal dialysis: Secondary | ICD-10-CM | POA: Diagnosis not present

## 2024-01-30 DIAGNOSIS — N186 End stage renal disease: Secondary | ICD-10-CM | POA: Diagnosis not present

## 2024-01-30 DIAGNOSIS — N2581 Secondary hyperparathyroidism of renal origin: Secondary | ICD-10-CM | POA: Diagnosis not present

## 2024-01-30 DIAGNOSIS — Z992 Dependence on renal dialysis: Secondary | ICD-10-CM | POA: Diagnosis not present

## 2024-01-31 DIAGNOSIS — M25561 Pain in right knee: Secondary | ICD-10-CM | POA: Diagnosis not present

## 2024-02-01 DIAGNOSIS — N186 End stage renal disease: Secondary | ICD-10-CM | POA: Diagnosis not present

## 2024-02-01 DIAGNOSIS — Z992 Dependence on renal dialysis: Secondary | ICD-10-CM | POA: Diagnosis not present

## 2024-02-01 DIAGNOSIS — N2581 Secondary hyperparathyroidism of renal origin: Secondary | ICD-10-CM | POA: Diagnosis not present

## 2024-02-04 DIAGNOSIS — N186 End stage renal disease: Secondary | ICD-10-CM | POA: Diagnosis not present

## 2024-02-04 DIAGNOSIS — Z992 Dependence on renal dialysis: Secondary | ICD-10-CM | POA: Diagnosis not present

## 2024-02-04 DIAGNOSIS — N2581 Secondary hyperparathyroidism of renal origin: Secondary | ICD-10-CM | POA: Diagnosis not present

## 2024-02-06 DIAGNOSIS — Z992 Dependence on renal dialysis: Secondary | ICD-10-CM | POA: Diagnosis not present

## 2024-02-06 DIAGNOSIS — N186 End stage renal disease: Secondary | ICD-10-CM | POA: Diagnosis not present

## 2024-02-06 DIAGNOSIS — N2581 Secondary hyperparathyroidism of renal origin: Secondary | ICD-10-CM | POA: Diagnosis not present

## 2024-02-08 DIAGNOSIS — N2581 Secondary hyperparathyroidism of renal origin: Secondary | ICD-10-CM | POA: Diagnosis not present

## 2024-02-08 DIAGNOSIS — N186 End stage renal disease: Secondary | ICD-10-CM | POA: Diagnosis not present

## 2024-02-08 DIAGNOSIS — Z992 Dependence on renal dialysis: Secondary | ICD-10-CM | POA: Diagnosis not present

## 2024-02-09 DIAGNOSIS — E1122 Type 2 diabetes mellitus with diabetic chronic kidney disease: Secondary | ICD-10-CM | POA: Diagnosis not present

## 2024-02-09 DIAGNOSIS — Z992 Dependence on renal dialysis: Secondary | ICD-10-CM | POA: Diagnosis not present

## 2024-02-09 DIAGNOSIS — N186 End stage renal disease: Secondary | ICD-10-CM | POA: Diagnosis not present

## 2024-02-11 DIAGNOSIS — N2581 Secondary hyperparathyroidism of renal origin: Secondary | ICD-10-CM | POA: Diagnosis not present

## 2024-02-11 DIAGNOSIS — N186 End stage renal disease: Secondary | ICD-10-CM | POA: Diagnosis not present

## 2024-02-11 DIAGNOSIS — Z992 Dependence on renal dialysis: Secondary | ICD-10-CM | POA: Diagnosis not present

## 2024-02-13 DIAGNOSIS — N186 End stage renal disease: Secondary | ICD-10-CM | POA: Diagnosis not present

## 2024-02-13 DIAGNOSIS — N2581 Secondary hyperparathyroidism of renal origin: Secondary | ICD-10-CM | POA: Diagnosis not present

## 2024-02-13 DIAGNOSIS — Z992 Dependence on renal dialysis: Secondary | ICD-10-CM | POA: Diagnosis not present

## 2024-02-15 DIAGNOSIS — N186 End stage renal disease: Secondary | ICD-10-CM | POA: Diagnosis not present

## 2024-02-15 DIAGNOSIS — N2581 Secondary hyperparathyroidism of renal origin: Secondary | ICD-10-CM | POA: Diagnosis not present

## 2024-02-15 DIAGNOSIS — Z992 Dependence on renal dialysis: Secondary | ICD-10-CM | POA: Diagnosis not present

## 2024-02-18 DIAGNOSIS — N186 End stage renal disease: Secondary | ICD-10-CM | POA: Diagnosis not present

## 2024-02-18 DIAGNOSIS — Z992 Dependence on renal dialysis: Secondary | ICD-10-CM | POA: Diagnosis not present

## 2024-02-18 DIAGNOSIS — N2581 Secondary hyperparathyroidism of renal origin: Secondary | ICD-10-CM | POA: Diagnosis not present

## 2024-02-20 DIAGNOSIS — N2581 Secondary hyperparathyroidism of renal origin: Secondary | ICD-10-CM | POA: Diagnosis not present

## 2024-02-20 DIAGNOSIS — N186 End stage renal disease: Secondary | ICD-10-CM | POA: Diagnosis not present

## 2024-02-20 DIAGNOSIS — Z992 Dependence on renal dialysis: Secondary | ICD-10-CM | POA: Diagnosis not present

## 2024-02-22 DIAGNOSIS — N186 End stage renal disease: Secondary | ICD-10-CM | POA: Diagnosis not present

## 2024-02-22 DIAGNOSIS — Z992 Dependence on renal dialysis: Secondary | ICD-10-CM | POA: Diagnosis not present

## 2024-02-22 DIAGNOSIS — N2581 Secondary hyperparathyroidism of renal origin: Secondary | ICD-10-CM | POA: Diagnosis not present

## 2024-02-25 DIAGNOSIS — N186 End stage renal disease: Secondary | ICD-10-CM | POA: Diagnosis not present

## 2024-02-25 DIAGNOSIS — N2581 Secondary hyperparathyroidism of renal origin: Secondary | ICD-10-CM | POA: Diagnosis not present

## 2024-02-25 DIAGNOSIS — Z992 Dependence on renal dialysis: Secondary | ICD-10-CM | POA: Diagnosis not present

## 2024-02-27 DIAGNOSIS — Z992 Dependence on renal dialysis: Secondary | ICD-10-CM | POA: Diagnosis not present

## 2024-02-27 DIAGNOSIS — N186 End stage renal disease: Secondary | ICD-10-CM | POA: Diagnosis not present

## 2024-02-27 DIAGNOSIS — N2581 Secondary hyperparathyroidism of renal origin: Secondary | ICD-10-CM | POA: Diagnosis not present

## 2024-02-28 DIAGNOSIS — Z96651 Presence of right artificial knee joint: Secondary | ICD-10-CM | POA: Diagnosis not present

## 2024-02-28 DIAGNOSIS — G8929 Other chronic pain: Secondary | ICD-10-CM | POA: Diagnosis not present

## 2024-02-28 DIAGNOSIS — M25561 Pain in right knee: Secondary | ICD-10-CM | POA: Diagnosis not present

## 2024-02-29 DIAGNOSIS — Z992 Dependence on renal dialysis: Secondary | ICD-10-CM | POA: Diagnosis not present

## 2024-02-29 DIAGNOSIS — N2581 Secondary hyperparathyroidism of renal origin: Secondary | ICD-10-CM | POA: Diagnosis not present

## 2024-02-29 DIAGNOSIS — N186 End stage renal disease: Secondary | ICD-10-CM | POA: Diagnosis not present

## 2024-03-03 DIAGNOSIS — Z992 Dependence on renal dialysis: Secondary | ICD-10-CM | POA: Diagnosis not present

## 2024-03-03 DIAGNOSIS — N2581 Secondary hyperparathyroidism of renal origin: Secondary | ICD-10-CM | POA: Diagnosis not present

## 2024-03-03 DIAGNOSIS — N186 End stage renal disease: Secondary | ICD-10-CM | POA: Diagnosis not present

## 2024-03-05 DIAGNOSIS — N186 End stage renal disease: Secondary | ICD-10-CM | POA: Diagnosis not present

## 2024-03-05 DIAGNOSIS — N2581 Secondary hyperparathyroidism of renal origin: Secondary | ICD-10-CM | POA: Diagnosis not present

## 2024-03-05 DIAGNOSIS — Z992 Dependence on renal dialysis: Secondary | ICD-10-CM | POA: Diagnosis not present

## 2024-03-07 DIAGNOSIS — N186 End stage renal disease: Secondary | ICD-10-CM | POA: Diagnosis not present

## 2024-03-07 DIAGNOSIS — Z992 Dependence on renal dialysis: Secondary | ICD-10-CM | POA: Diagnosis not present

## 2024-03-07 DIAGNOSIS — N2581 Secondary hyperparathyroidism of renal origin: Secondary | ICD-10-CM | POA: Diagnosis not present

## 2024-03-10 DIAGNOSIS — N186 End stage renal disease: Secondary | ICD-10-CM | POA: Diagnosis not present

## 2024-03-10 DIAGNOSIS — N2581 Secondary hyperparathyroidism of renal origin: Secondary | ICD-10-CM | POA: Diagnosis not present

## 2024-03-10 DIAGNOSIS — E1122 Type 2 diabetes mellitus with diabetic chronic kidney disease: Secondary | ICD-10-CM | POA: Diagnosis not present

## 2024-03-10 DIAGNOSIS — Z992 Dependence on renal dialysis: Secondary | ICD-10-CM | POA: Diagnosis not present

## 2024-03-12 DIAGNOSIS — N2581 Secondary hyperparathyroidism of renal origin: Secondary | ICD-10-CM | POA: Diagnosis not present

## 2024-03-12 DIAGNOSIS — N186 End stage renal disease: Secondary | ICD-10-CM | POA: Diagnosis not present

## 2024-03-12 DIAGNOSIS — Z992 Dependence on renal dialysis: Secondary | ICD-10-CM | POA: Diagnosis not present

## 2024-03-17 DIAGNOSIS — N2581 Secondary hyperparathyroidism of renal origin: Secondary | ICD-10-CM | POA: Diagnosis not present

## 2024-03-17 DIAGNOSIS — Z992 Dependence on renal dialysis: Secondary | ICD-10-CM | POA: Diagnosis not present

## 2024-03-17 DIAGNOSIS — N186 End stage renal disease: Secondary | ICD-10-CM | POA: Diagnosis not present

## 2024-03-19 DIAGNOSIS — Z992 Dependence on renal dialysis: Secondary | ICD-10-CM | POA: Diagnosis not present

## 2024-03-19 DIAGNOSIS — N2581 Secondary hyperparathyroidism of renal origin: Secondary | ICD-10-CM | POA: Diagnosis not present

## 2024-03-19 DIAGNOSIS — N186 End stage renal disease: Secondary | ICD-10-CM | POA: Diagnosis not present

## 2024-03-21 DIAGNOSIS — N2581 Secondary hyperparathyroidism of renal origin: Secondary | ICD-10-CM | POA: Diagnosis not present

## 2024-03-21 DIAGNOSIS — N186 End stage renal disease: Secondary | ICD-10-CM | POA: Diagnosis not present

## 2024-03-21 DIAGNOSIS — Z992 Dependence on renal dialysis: Secondary | ICD-10-CM | POA: Diagnosis not present

## 2024-03-24 DIAGNOSIS — Z992 Dependence on renal dialysis: Secondary | ICD-10-CM | POA: Diagnosis not present

## 2024-03-24 DIAGNOSIS — N186 End stage renal disease: Secondary | ICD-10-CM | POA: Diagnosis not present

## 2024-03-24 DIAGNOSIS — N2581 Secondary hyperparathyroidism of renal origin: Secondary | ICD-10-CM | POA: Diagnosis not present

## 2024-03-26 DIAGNOSIS — N186 End stage renal disease: Secondary | ICD-10-CM | POA: Diagnosis not present

## 2024-03-26 DIAGNOSIS — Z992 Dependence on renal dialysis: Secondary | ICD-10-CM | POA: Diagnosis not present

## 2024-03-26 DIAGNOSIS — N2581 Secondary hyperparathyroidism of renal origin: Secondary | ICD-10-CM | POA: Diagnosis not present

## 2024-03-31 DIAGNOSIS — N2581 Secondary hyperparathyroidism of renal origin: Secondary | ICD-10-CM | POA: Diagnosis not present

## 2024-03-31 DIAGNOSIS — Z992 Dependence on renal dialysis: Secondary | ICD-10-CM | POA: Diagnosis not present

## 2024-03-31 DIAGNOSIS — N186 End stage renal disease: Secondary | ICD-10-CM | POA: Diagnosis not present

## 2024-04-01 DIAGNOSIS — M25552 Pain in left hip: Secondary | ICD-10-CM | POA: Diagnosis not present

## 2024-04-01 DIAGNOSIS — H52223 Regular astigmatism, bilateral: Secondary | ICD-10-CM | POA: Diagnosis not present

## 2024-04-01 DIAGNOSIS — G4700 Insomnia, unspecified: Secondary | ICD-10-CM | POA: Diagnosis not present

## 2024-04-01 DIAGNOSIS — H524 Presbyopia: Secondary | ICD-10-CM | POA: Diagnosis not present

## 2024-04-01 DIAGNOSIS — H5203 Hypermetropia, bilateral: Secondary | ICD-10-CM | POA: Diagnosis not present

## 2024-04-01 DIAGNOSIS — N186 End stage renal disease: Secondary | ICD-10-CM | POA: Diagnosis not present

## 2024-04-01 DIAGNOSIS — Z79899 Other long term (current) drug therapy: Secondary | ICD-10-CM | POA: Diagnosis not present

## 2024-04-01 DIAGNOSIS — M25551 Pain in right hip: Secondary | ICD-10-CM | POA: Diagnosis not present

## 2024-04-01 DIAGNOSIS — M129 Arthropathy, unspecified: Secondary | ICD-10-CM | POA: Diagnosis not present

## 2024-04-01 DIAGNOSIS — M889 Osteitis deformans of unspecified bone: Secondary | ICD-10-CM | POA: Diagnosis not present

## 2024-04-01 DIAGNOSIS — M25569 Pain in unspecified knee: Secondary | ICD-10-CM | POA: Diagnosis not present

## 2024-04-01 DIAGNOSIS — G8929 Other chronic pain: Secondary | ICD-10-CM | POA: Diagnosis not present

## 2024-04-02 DIAGNOSIS — N186 End stage renal disease: Secondary | ICD-10-CM | POA: Diagnosis not present

## 2024-04-02 DIAGNOSIS — N2581 Secondary hyperparathyroidism of renal origin: Secondary | ICD-10-CM | POA: Diagnosis not present

## 2024-04-02 DIAGNOSIS — Z992 Dependence on renal dialysis: Secondary | ICD-10-CM | POA: Diagnosis not present

## 2024-04-04 DIAGNOSIS — N2581 Secondary hyperparathyroidism of renal origin: Secondary | ICD-10-CM | POA: Diagnosis not present

## 2024-04-04 DIAGNOSIS — N186 End stage renal disease: Secondary | ICD-10-CM | POA: Diagnosis not present

## 2024-04-04 DIAGNOSIS — Z992 Dependence on renal dialysis: Secondary | ICD-10-CM | POA: Diagnosis not present

## 2024-04-07 DIAGNOSIS — N186 End stage renal disease: Secondary | ICD-10-CM | POA: Diagnosis not present

## 2024-04-07 DIAGNOSIS — N2581 Secondary hyperparathyroidism of renal origin: Secondary | ICD-10-CM | POA: Diagnosis not present

## 2024-04-07 DIAGNOSIS — Z992 Dependence on renal dialysis: Secondary | ICD-10-CM | POA: Diagnosis not present

## 2024-04-09 DIAGNOSIS — N2581 Secondary hyperparathyroidism of renal origin: Secondary | ICD-10-CM | POA: Diagnosis not present

## 2024-04-09 DIAGNOSIS — Z992 Dependence on renal dialysis: Secondary | ICD-10-CM | POA: Diagnosis not present

## 2024-04-09 DIAGNOSIS — N186 End stage renal disease: Secondary | ICD-10-CM | POA: Diagnosis not present

## 2024-04-10 DIAGNOSIS — N186 End stage renal disease: Secondary | ICD-10-CM | POA: Diagnosis not present

## 2024-04-10 DIAGNOSIS — Z992 Dependence on renal dialysis: Secondary | ICD-10-CM | POA: Diagnosis not present

## 2024-04-10 DIAGNOSIS — E1122 Type 2 diabetes mellitus with diabetic chronic kidney disease: Secondary | ICD-10-CM | POA: Diagnosis not present

## 2024-04-11 DIAGNOSIS — N2581 Secondary hyperparathyroidism of renal origin: Secondary | ICD-10-CM | POA: Diagnosis not present

## 2024-04-11 DIAGNOSIS — N186 End stage renal disease: Secondary | ICD-10-CM | POA: Diagnosis not present

## 2024-04-11 DIAGNOSIS — Z992 Dependence on renal dialysis: Secondary | ICD-10-CM | POA: Diagnosis not present

## 2024-04-14 DIAGNOSIS — N186 End stage renal disease: Secondary | ICD-10-CM | POA: Diagnosis not present

## 2024-04-14 DIAGNOSIS — N2581 Secondary hyperparathyroidism of renal origin: Secondary | ICD-10-CM | POA: Diagnosis not present

## 2024-04-14 DIAGNOSIS — Z992 Dependence on renal dialysis: Secondary | ICD-10-CM | POA: Diagnosis not present

## 2024-04-16 DIAGNOSIS — N186 End stage renal disease: Secondary | ICD-10-CM | POA: Diagnosis not present

## 2024-04-16 DIAGNOSIS — N2581 Secondary hyperparathyroidism of renal origin: Secondary | ICD-10-CM | POA: Diagnosis not present

## 2024-04-16 DIAGNOSIS — Z992 Dependence on renal dialysis: Secondary | ICD-10-CM | POA: Diagnosis not present

## 2024-04-18 DIAGNOSIS — N2581 Secondary hyperparathyroidism of renal origin: Secondary | ICD-10-CM | POA: Diagnosis not present

## 2024-04-18 DIAGNOSIS — Z992 Dependence on renal dialysis: Secondary | ICD-10-CM | POA: Diagnosis not present

## 2024-04-18 DIAGNOSIS — N186 End stage renal disease: Secondary | ICD-10-CM | POA: Diagnosis not present

## 2024-04-21 DIAGNOSIS — N186 End stage renal disease: Secondary | ICD-10-CM | POA: Diagnosis not present

## 2024-04-21 DIAGNOSIS — N2581 Secondary hyperparathyroidism of renal origin: Secondary | ICD-10-CM | POA: Diagnosis not present

## 2024-04-21 DIAGNOSIS — Z992 Dependence on renal dialysis: Secondary | ICD-10-CM | POA: Diagnosis not present

## 2024-04-23 DIAGNOSIS — Z992 Dependence on renal dialysis: Secondary | ICD-10-CM | POA: Diagnosis not present

## 2024-04-23 DIAGNOSIS — N186 End stage renal disease: Secondary | ICD-10-CM | POA: Diagnosis not present

## 2024-04-23 DIAGNOSIS — N2581 Secondary hyperparathyroidism of renal origin: Secondary | ICD-10-CM | POA: Diagnosis not present

## 2024-04-25 DIAGNOSIS — N2581 Secondary hyperparathyroidism of renal origin: Secondary | ICD-10-CM | POA: Diagnosis not present

## 2024-04-25 DIAGNOSIS — Z992 Dependence on renal dialysis: Secondary | ICD-10-CM | POA: Diagnosis not present

## 2024-04-25 DIAGNOSIS — N186 End stage renal disease: Secondary | ICD-10-CM | POA: Diagnosis not present

## 2024-04-28 DIAGNOSIS — Z992 Dependence on renal dialysis: Secondary | ICD-10-CM | POA: Diagnosis not present

## 2024-04-28 DIAGNOSIS — N186 End stage renal disease: Secondary | ICD-10-CM | POA: Diagnosis not present

## 2024-04-28 DIAGNOSIS — N2581 Secondary hyperparathyroidism of renal origin: Secondary | ICD-10-CM | POA: Diagnosis not present

## 2024-04-29 DIAGNOSIS — M889 Osteitis deformans of unspecified bone: Secondary | ICD-10-CM | POA: Diagnosis not present

## 2024-04-29 DIAGNOSIS — Z79899 Other long term (current) drug therapy: Secondary | ICD-10-CM | POA: Diagnosis not present

## 2024-04-29 DIAGNOSIS — M25552 Pain in left hip: Secondary | ICD-10-CM | POA: Diagnosis not present

## 2024-04-29 DIAGNOSIS — G4700 Insomnia, unspecified: Secondary | ICD-10-CM | POA: Diagnosis not present

## 2024-04-29 DIAGNOSIS — G8929 Other chronic pain: Secondary | ICD-10-CM | POA: Diagnosis not present

## 2024-04-29 DIAGNOSIS — M25551 Pain in right hip: Secondary | ICD-10-CM | POA: Diagnosis not present

## 2024-04-29 DIAGNOSIS — Z96659 Presence of unspecified artificial knee joint: Secondary | ICD-10-CM | POA: Diagnosis not present

## 2024-04-29 DIAGNOSIS — M25569 Pain in unspecified knee: Secondary | ICD-10-CM | POA: Diagnosis not present

## 2024-04-29 DIAGNOSIS — N186 End stage renal disease: Secondary | ICD-10-CM | POA: Diagnosis not present

## 2024-04-30 DIAGNOSIS — N2581 Secondary hyperparathyroidism of renal origin: Secondary | ICD-10-CM | POA: Diagnosis not present

## 2024-04-30 DIAGNOSIS — Z992 Dependence on renal dialysis: Secondary | ICD-10-CM | POA: Diagnosis not present

## 2024-04-30 DIAGNOSIS — N186 End stage renal disease: Secondary | ICD-10-CM | POA: Diagnosis not present

## 2024-05-02 DIAGNOSIS — N2581 Secondary hyperparathyroidism of renal origin: Secondary | ICD-10-CM | POA: Diagnosis not present

## 2024-05-02 DIAGNOSIS — N186 End stage renal disease: Secondary | ICD-10-CM | POA: Diagnosis not present

## 2024-05-02 DIAGNOSIS — Z992 Dependence on renal dialysis: Secondary | ICD-10-CM | POA: Diagnosis not present

## 2024-05-05 DIAGNOSIS — N2581 Secondary hyperparathyroidism of renal origin: Secondary | ICD-10-CM | POA: Diagnosis not present

## 2024-05-05 DIAGNOSIS — Z992 Dependence on renal dialysis: Secondary | ICD-10-CM | POA: Diagnosis not present

## 2024-05-05 DIAGNOSIS — N186 End stage renal disease: Secondary | ICD-10-CM | POA: Diagnosis not present

## 2024-05-07 DIAGNOSIS — N2581 Secondary hyperparathyroidism of renal origin: Secondary | ICD-10-CM | POA: Diagnosis not present

## 2024-05-07 DIAGNOSIS — N186 End stage renal disease: Secondary | ICD-10-CM | POA: Diagnosis not present

## 2024-05-07 DIAGNOSIS — Z992 Dependence on renal dialysis: Secondary | ICD-10-CM | POA: Diagnosis not present

## 2024-05-08 ENCOUNTER — Ambulatory Visit (HOSPITAL_COMMUNITY)
Admission: RE | Admit: 2024-05-08 | Discharge: 2024-05-08 | Disposition: A | Discharge status: Home / Self Care | Attending: Vascular Surgery | Admitting: Vascular Surgery

## 2024-05-08 ENCOUNTER — Other Ambulatory Visit: Payer: Self-pay

## 2024-05-08 ENCOUNTER — Encounter (HOSPITAL_COMMUNITY)
Admission: RE | Disposition: A | Discharge status: Home / Self Care | Payer: Self-pay | Source: Home / Self Care | Attending: Vascular Surgery

## 2024-05-08 DIAGNOSIS — I120 Hypertensive chronic kidney disease with stage 5 chronic kidney disease or end stage renal disease: Secondary | ICD-10-CM | POA: Diagnosis not present

## 2024-05-08 DIAGNOSIS — Z992 Dependence on renal dialysis: Secondary | ICD-10-CM | POA: Insufficient documentation

## 2024-05-08 DIAGNOSIS — Y832 Surgical operation with anastomosis, bypass or graft as the cause of abnormal reaction of the patient, or of later complication, without mention of misadventure at the time of the procedure: Secondary | ICD-10-CM | POA: Insufficient documentation

## 2024-05-08 DIAGNOSIS — N186 End stage renal disease: Secondary | ICD-10-CM | POA: Insufficient documentation

## 2024-05-08 DIAGNOSIS — T82858A Stenosis of vascular prosthetic devices, implants and grafts, initial encounter: Secondary | ICD-10-CM | POA: Diagnosis not present

## 2024-05-08 HISTORY — PX: A/V SHUNT INTERVENTION: CATH118220

## 2024-05-08 HISTORY — PX: VENOUS ANGIOPLASTY: CATH118376

## 2024-05-08 SURGERY — A/V SHUNT INTERVENTION
Anesthesia: LOCAL | Site: Arm Upper | Laterality: Left

## 2024-05-08 MED ORDER — LIDOCAINE HCL (PF) 1 % IJ SOLN
INTRAMUSCULAR | Status: DC | PRN
  Administered 2024-05-08: 2 mL via INTRADERMAL

## 2024-05-08 MED ORDER — HEPARIN (PORCINE) IN NACL 1000-0.9 UT/500ML-% IV SOLN
INTRAVENOUS | Status: DC | PRN
  Administered 2024-05-08: 500 mL

## 2024-05-08 MED ORDER — IODIXANOL 320 MG/ML IV SOLN
INTRAVENOUS | Status: DC | PRN
  Administered 2024-05-08: 45 mL

## 2024-05-08 MED ORDER — LIDOCAINE HCL (PF) 1 % IJ SOLN
INTRAMUSCULAR | Status: AC
  Filled 2024-05-08: qty 30

## 2024-05-08 SURGICAL SUPPLY — 9 items
BALLOON MUSTANG 8.0X40 75 (BALLOONS) IMPLANT
KIT ENCORE 26 ADVANTAGE (KITS) IMPLANT
KIT MICROPUNCTURE NIT STIFF (SHEATH) IMPLANT
KIT PV (KITS) ×2 IMPLANT
SHEATH PINNACLE R/O II 6F 4CM (SHEATH) IMPLANT
SHEATH PROBE COVER 6X72 (BAG) IMPLANT
TRAY PV CATH (CUSTOM PROCEDURE TRAY) ×2 IMPLANT
TUBING CIL FLEX 10 FLL-RA (TUBING) IMPLANT
WIRE BENTSON .035X145CM (WIRE) IMPLANT

## 2024-05-08 NOTE — Op Note (Signed)
    Patient name: Norma Coleman MRN: [id number] DOB: [birth year] Sex: female  05/08/2024 Pre-operative Diagnosis: End-stage renal disease, malfunction left arm AV fistula Post-operative diagnosis:  Same Surgeon:  Penne BROCKS. Sheree, MD Procedure Performed: 1.  Percutaneous ultrasound-guided cannulation left arm AV fistula 2.  Left upper extremity fistulogram 3.  Balloon angioplasty mid upper arm cephalic vein fistula with 8 mm balloon   Indications: 89 year old female with history of end-stage renal disease dialyzing via left upper arm AV fistula.  She has pulsatility in the fistula with difficulty clearance and now is indicated for fistulogram with possible intervention.  Findings: Fistula is patent centrally the cephalic arch, subclavian vein, left innominate and SVC all drained briskly.  In the mid cephalic vein there is approximately 60% stenosis reduced to less than 10% after balloon angioplasty.  The arteriovenous anastomosis is patent.  At completion there is a much improved thrill throughout the fistula.  Fistula remains amenable to future percutaneous access and intervention.   Procedure:  The patient was identified in the holding area and taken to the heart and vascular procedure room.  The patient was then placed supine on the table and prepped and draped in the usual sterile fashion.  A time out was called.  Ultrasound was used to evaluate the left arm AV fistula and near the antecubital I anesthetized the skin and cannulated with direct ultrasound visualization using a micropuncture needle followed by wire sheath.  Ultrasound images saved for permanent record.  We then performed left upper extremity fistulogram with treatment findings placed a Bentson wire which did get hung up on the lesion and placed a 6 French sheath.  I was ultimately able to get a Bentson wire across the lesion and perform balloon angioplasty with 8 mm balloon.  At completion there was much improved flow and  thrill in the fistula.  Satisfied with this we removed the balloon wire suture-ligated the cannulation site and the sheath was removed.  She tolerated the procedure without any complication.  Contrast: 45 cc     Alli Jasmer C. Sheree, MD [facility] Office: [phone] Pager: [phone]

## 2024-05-08 NOTE — H&P (Signed)
 H+P History of Present Illness: This is a 89 y.o. female history of end-stage renal disease on dialysis via left upper arm AV fistula which was placed in [place] over 2 years ago.  She has been dialyzing well via fistula but now has some difficulty clearance with alarming on dialysis.  She does have pulsatility of fistula is here today for fistulogram with possible intervention.  She denies previous endovascular evaluation or intervention.  Past Medical History:  Diagnosis Date   DVT (deep venous thrombosis) (HCC)    GI bleed due to NSAIDs    High cholesterol    Hypertension    Hyperthyroidism    Pneumonia    Skin cancer    Thyroid  disease     Past Surgical History:  Procedure Laterality Date   IR FLUORO GUIDE CV LINE RIGHT  09/18/2022   IR US  GUIDE VASC ACCESS RIGHT  09/18/2022   KNEE SURGERY     TONSILLECTOMY     TUBAL LIGATION      Allergies  Allergen Reactions   Ibuprofen Other (See Comments)    Stomach bleed   Sulfa Antibiotics     Prior to Admission medications   Medication Sig Start Date End Date Taking? Authorizing Provider  ALPRAZolam  (XANAX ) 1 MG tablet Take 1 mg by mouth at bedtime as needed for anxiety.    [provider]  amLODipine  (NORVASC ) 10 MG tablet Take 10 mg by mouth daily. 06/30/22   [provider]  ascorbic acid (VITAMIN C) 500 MG tablet Take 500 mg by mouth daily.    [provider]  atorvastatin  (LIPITOR) 40 MG tablet Take 40 mg by mouth daily. 01/09/20   [provider]  benzonatate  (TESSALON ) 100 MG capsule Take 1 capsule (100 mg total) by mouth 3 (three) times daily as needed for cough. 09/20/22   Nooruddin, Saad, MD  Biotin 5 MG CAPS Take 5 mg by mouth daily.    [provider]  carvedilol  (COREG ) 3.125 MG tablet Take 1 tablet by mouth 2 (two) times daily with a meal. 06/30/22   [provider]  cholecalciferol (VITAMIN D3) 25 MCG (1000 UNIT) tablet Take 1,000 Units by mouth daily.    [provider]  DM-Doxylamine-Acetaminophen  (NYQUIL COLD & FLU PO) Take 5 mLs by mouth daily as needed (cough).    [provider]  hydrALAZINE  (APRESOLINE ) 50 MG tablet Take 50 mg by mouth 3 (three) times daily. 04/28/22   [provider]  levothyroxine  (SYNTHROID ) 75 MCG tablet Take 75 mcg by mouth daily before breakfast. 06/30/22   [provider]  Multiple Vitamins-Minerals (MULTIVITAMIN WITH MINERALS) tablet Take 1 tablet by mouth daily.    [provider]  oxyCODONE (OXY IR/ROXICODONE) 5 MG immediate release tablet Take 5 mg by mouth daily as needed for moderate pain or severe pain. 05/11/22   [provider]  sodium bicarbonate  650 MG tablet Take 650 mg by mouth 2 (two) times daily. 07/11/21   [provider]  vitamin B-12 (CYANOCOBALAMIN ) 100 MCG tablet Take 100 mcg by mouth daily.    [provider]    Social History   Socioeconomic History   Marital status: Widowed    Spouse name: Not on file   Number of children: Not on file   Years of education: Not on file   Highest education level: Not on file  Occupational History   Not on file  Tobacco Use   Smoking status: Never   Smokeless tobacco: Never  Substance and Sexual Activity   Alcohol use: Never   Drug use: Never   Sexual activity: Not Currently  Other Topics Concern   Not on file  Social History Narrative   Not on file   Social Drivers of Health   Financial Resource Strain: Not on file  Food Insecurity: No Food Insecurity (09/12/2022)   Hunger Vital Sign    Worried About Running Out of Food in the Last Year: Never true    Ran Out of Food in the Last Year: Never true  Transportation Needs: No Transportation Needs (09/12/2022)   PRAPARE - Administrator, Civil Service (Medical): No    Lack of Transportation (Non-Medical): No  Physical Activity: Not on file  Stress: Not on file  Social Connections: Not on file  Intimate Partner Violence: Not At  Risk (09/12/2022)   Humiliation, Afraid, Rape, and Kick questionnaire    Fear of Current or Ex-Partner: No    Emotionally Abused: No    Physically Abused: No    Sexually Abused: No    No family history on file.  ROS: Only complaint is difficulty with dialysis access  Physical Examination  Vitals:   05/08/24 1116 05/08/24 1126  BP: (!) 195/84 (!) 171/155  Pulse: 70 64  Resp: 12 16  SpO2: 93% 95%   There is no height or weight on file to calculate BMI.  Awake alert oriented On lab respirations Left upper arm AV fistula with mild pulsatility  CBC    Component Value Date/Time   WBC 5.0 09/20/2022 0238   RBC 2.51 (L) 09/20/2022 0238   HGB 8.1 (L) 09/20/2022 0238   HCT 24.9 (L) 09/20/2022 0238   PLT 199 09/20/2022 0238   MCV 99.2 09/20/2022 0238   MCH 32.3 09/20/2022 0238   MCHC 32.5 09/20/2022 0238   RDW 14.8 09/20/2022 0238   LYMPHSABS 0.4 (L) 09/12/2022 1104   MONOABS 0.3 09/12/2022 1104   EOSABS 0.0 09/12/2022 1104   BASOSABS 0.0 09/12/2022 1104    BMET    Component Value Date/Time   NA 133 (L) 09/20/2022 0238   K 3.8 09/20/2022 0238   CL 99 09/20/2022 0238   CO2 24 09/20/2022 0238   GLUCOSE 90 09/20/2022 0238   BUN 18 09/20/2022 0238   CREATININE 3.15 (H) 09/20/2022 0238   CALCIUM  8.2 (L) 09/20/2022 0238   GFRNONAA 14 (L) 09/20/2022 0238    COAGS: Lab Results  Component Value Date   INR 1.1 08/05/2022       ASSESSMENT/PLAN: This is a 89 y.o. female with end-stage renal disease with malfunction of the left upper arm AV fistula which has never been intervened upon before.  Plan for fistulogram possible intervention today.  We discussed risk benefits and alternatives she demonstrates good understanding and consent was signed.  Yogi Arther C. Sheree, MD [facility] Office: [phone] Pager: [phone]

## 2024-05-09 ENCOUNTER — Encounter (HOSPITAL_COMMUNITY): Payer: Self-pay | Admitting: Vascular Surgery

## 2024-05-09 DIAGNOSIS — N2581 Secondary hyperparathyroidism of renal origin: Secondary | ICD-10-CM | POA: Diagnosis not present

## 2024-05-09 DIAGNOSIS — N186 End stage renal disease: Secondary | ICD-10-CM | POA: Diagnosis not present

## 2024-05-09 DIAGNOSIS — Z992 Dependence on renal dialysis: Secondary | ICD-10-CM | POA: Diagnosis not present

## 2024-05-11 DIAGNOSIS — N186 End stage renal disease: Secondary | ICD-10-CM | POA: Diagnosis not present

## 2024-05-11 DIAGNOSIS — E1122 Type 2 diabetes mellitus with diabetic chronic kidney disease: Secondary | ICD-10-CM | POA: Diagnosis not present

## 2024-05-11 DIAGNOSIS — Z992 Dependence on renal dialysis: Secondary | ICD-10-CM | POA: Diagnosis not present

## 2024-05-12 DIAGNOSIS — N2581 Secondary hyperparathyroidism of renal origin: Secondary | ICD-10-CM | POA: Diagnosis not present

## 2024-05-12 DIAGNOSIS — Z992 Dependence on renal dialysis: Secondary | ICD-10-CM | POA: Diagnosis not present

## 2024-05-12 DIAGNOSIS — N186 End stage renal disease: Secondary | ICD-10-CM | POA: Diagnosis not present

## 2024-05-14 DIAGNOSIS — N186 End stage renal disease: Secondary | ICD-10-CM | POA: Diagnosis not present

## 2024-05-14 DIAGNOSIS — Z992 Dependence on renal dialysis: Secondary | ICD-10-CM | POA: Diagnosis not present

## 2024-05-14 DIAGNOSIS — N2581 Secondary hyperparathyroidism of renal origin: Secondary | ICD-10-CM | POA: Diagnosis not present

## 2024-05-19 DIAGNOSIS — Z992 Dependence on renal dialysis: Secondary | ICD-10-CM | POA: Diagnosis not present

## 2024-05-19 DIAGNOSIS — N186 End stage renal disease: Secondary | ICD-10-CM | POA: Diagnosis not present

## 2024-05-19 DIAGNOSIS — N2581 Secondary hyperparathyroidism of renal origin: Secondary | ICD-10-CM | POA: Diagnosis not present

## 2024-05-23 DIAGNOSIS — Z992 Dependence on renal dialysis: Secondary | ICD-10-CM | POA: Diagnosis not present

## 2024-05-23 DIAGNOSIS — N2581 Secondary hyperparathyroidism of renal origin: Secondary | ICD-10-CM | POA: Diagnosis not present

## 2024-05-23 DIAGNOSIS — N186 End stage renal disease: Secondary | ICD-10-CM | POA: Diagnosis not present

## 2024-05-27 DIAGNOSIS — M25551 Pain in right hip: Secondary | ICD-10-CM | POA: Diagnosis not present

## 2024-05-27 DIAGNOSIS — Z79899 Other long term (current) drug therapy: Secondary | ICD-10-CM | POA: Diagnosis not present

## 2024-05-27 DIAGNOSIS — Z96659 Presence of unspecified artificial knee joint: Secondary | ICD-10-CM | POA: Diagnosis not present

## 2024-05-27 DIAGNOSIS — M25552 Pain in left hip: Secondary | ICD-10-CM | POA: Diagnosis not present

## 2024-05-27 DIAGNOSIS — G4700 Insomnia, unspecified: Secondary | ICD-10-CM | POA: Diagnosis not present

## 2024-05-27 DIAGNOSIS — N186 End stage renal disease: Secondary | ICD-10-CM | POA: Diagnosis not present

## 2024-05-27 DIAGNOSIS — M889 Osteitis deformans of unspecified bone: Secondary | ICD-10-CM | POA: Diagnosis not present

## 2024-05-27 DIAGNOSIS — G8929 Other chronic pain: Secondary | ICD-10-CM | POA: Diagnosis not present

## 2024-05-27 DIAGNOSIS — M25569 Pain in unspecified knee: Secondary | ICD-10-CM | POA: Diagnosis not present

## 2024-05-28 DIAGNOSIS — N186 End stage renal disease: Secondary | ICD-10-CM | POA: Diagnosis not present

## 2024-05-28 DIAGNOSIS — N2581 Secondary hyperparathyroidism of renal origin: Secondary | ICD-10-CM | POA: Diagnosis not present

## 2024-05-28 DIAGNOSIS — Z992 Dependence on renal dialysis: Secondary | ICD-10-CM | POA: Diagnosis not present

## 2024-06-02 DIAGNOSIS — Z992 Dependence on renal dialysis: Secondary | ICD-10-CM | POA: Diagnosis not present

## 2024-06-02 DIAGNOSIS — N2581 Secondary hyperparathyroidism of renal origin: Secondary | ICD-10-CM | POA: Diagnosis not present

## 2024-06-02 DIAGNOSIS — N186 End stage renal disease: Secondary | ICD-10-CM | POA: Diagnosis not present

## 2024-06-04 DIAGNOSIS — N2581 Secondary hyperparathyroidism of renal origin: Secondary | ICD-10-CM | POA: Diagnosis not present

## 2024-06-04 DIAGNOSIS — Z992 Dependence on renal dialysis: Secondary | ICD-10-CM | POA: Diagnosis not present

## 2024-06-04 DIAGNOSIS — N186 End stage renal disease: Secondary | ICD-10-CM | POA: Diagnosis not present

## 2024-06-06 DIAGNOSIS — Z992 Dependence on renal dialysis: Secondary | ICD-10-CM | POA: Diagnosis not present

## 2024-06-06 DIAGNOSIS — N186 End stage renal disease: Secondary | ICD-10-CM | POA: Diagnosis not present

## 2024-06-06 DIAGNOSIS — N2581 Secondary hyperparathyroidism of renal origin: Secondary | ICD-10-CM | POA: Diagnosis not present

## 2024-06-09 DIAGNOSIS — N186 End stage renal disease: Secondary | ICD-10-CM | POA: Diagnosis not present

## 2024-06-09 DIAGNOSIS — N2581 Secondary hyperparathyroidism of renal origin: Secondary | ICD-10-CM | POA: Diagnosis not present

## 2024-06-09 DIAGNOSIS — Z992 Dependence on renal dialysis: Secondary | ICD-10-CM | POA: Diagnosis not present

## 2024-06-10 DIAGNOSIS — N186 End stage renal disease: Secondary | ICD-10-CM | POA: Diagnosis not present

## 2024-06-10 DIAGNOSIS — Z992 Dependence on renal dialysis: Secondary | ICD-10-CM | POA: Diagnosis not present

## 2024-06-10 DIAGNOSIS — E1122 Type 2 diabetes mellitus with diabetic chronic kidney disease: Secondary | ICD-10-CM | POA: Diagnosis not present

## 2024-06-11 DIAGNOSIS — N2581 Secondary hyperparathyroidism of renal origin: Secondary | ICD-10-CM | POA: Diagnosis not present

## 2024-06-11 DIAGNOSIS — N186 End stage renal disease: Secondary | ICD-10-CM | POA: Diagnosis not present

## 2024-06-11 DIAGNOSIS — Z992 Dependence on renal dialysis: Secondary | ICD-10-CM | POA: Diagnosis not present

## 2024-06-12 ENCOUNTER — Other Ambulatory Visit (HOSPITAL_COMMUNITY): Payer: Self-pay | Admitting: Nephrology

## 2024-06-12 DIAGNOSIS — R319 Hematuria, unspecified: Secondary | ICD-10-CM

## 2024-06-13 DIAGNOSIS — N186 End stage renal disease: Secondary | ICD-10-CM | POA: Diagnosis not present

## 2024-06-13 DIAGNOSIS — Z992 Dependence on renal dialysis: Secondary | ICD-10-CM | POA: Diagnosis not present

## 2024-06-18 DIAGNOSIS — N2581 Secondary hyperparathyroidism of renal origin: Secondary | ICD-10-CM | POA: Diagnosis not present

## 2024-06-18 DIAGNOSIS — Z992 Dependence on renal dialysis: Secondary | ICD-10-CM | POA: Diagnosis not present

## 2024-06-18 DIAGNOSIS — N186 End stage renal disease: Secondary | ICD-10-CM | POA: Diagnosis not present

## 2024-06-23 DIAGNOSIS — N2581 Secondary hyperparathyroidism of renal origin: Secondary | ICD-10-CM | POA: Diagnosis not present

## 2024-06-23 DIAGNOSIS — Z992 Dependence on renal dialysis: Secondary | ICD-10-CM | POA: Diagnosis not present

## 2024-06-24 ENCOUNTER — Encounter (HOSPITAL_COMMUNITY): Payer: Self-pay

## 2024-06-25 DIAGNOSIS — N186 End stage renal disease: Secondary | ICD-10-CM | POA: Diagnosis not present

## 2024-06-26 ENCOUNTER — Ambulatory Visit (HOSPITAL_COMMUNITY)
Admission: RE | Admit: 2024-06-26 | Discharge: 2024-06-26 | Disposition: A | Discharge status: Home / Self Care | Source: Ambulatory Visit | Attending: Nephrology | Admitting: Nephrology

## 2024-06-26 DIAGNOSIS — R319 Hematuria, unspecified: Secondary | ICD-10-CM | POA: Diagnosis not present

## 2024-06-26 DIAGNOSIS — N200 Calculus of kidney: Secondary | ICD-10-CM | POA: Diagnosis not present

## 2024-06-27 DIAGNOSIS — Z992 Dependence on renal dialysis: Secondary | ICD-10-CM | POA: Diagnosis not present

## 2024-06-27 DIAGNOSIS — N2581 Secondary hyperparathyroidism of renal origin: Secondary | ICD-10-CM | POA: Diagnosis not present

## 2024-07-07 DIAGNOSIS — Z992 Dependence on renal dialysis: Secondary | ICD-10-CM | POA: Diagnosis not present

## 2024-07-07 DIAGNOSIS — N2581 Secondary hyperparathyroidism of renal origin: Secondary | ICD-10-CM | POA: Diagnosis not present

## 2024-07-07 DIAGNOSIS — N186 End stage renal disease: Secondary | ICD-10-CM | POA: Diagnosis not present

## 2024-07-11 DIAGNOSIS — Z992 Dependence on renal dialysis: Secondary | ICD-10-CM | POA: Diagnosis not present

## 2024-07-11 DIAGNOSIS — N186 End stage renal disease: Secondary | ICD-10-CM | POA: Diagnosis not present

## 2024-07-11 DIAGNOSIS — E1122 Type 2 diabetes mellitus with diabetic chronic kidney disease: Secondary | ICD-10-CM | POA: Diagnosis not present

## 2024-07-11 DIAGNOSIS — N2581 Secondary hyperparathyroidism of renal origin: Secondary | ICD-10-CM | POA: Diagnosis not present

## 2024-07-14 DIAGNOSIS — N2581 Secondary hyperparathyroidism of renal origin: Secondary | ICD-10-CM | POA: Diagnosis not present

## 2024-07-14 DIAGNOSIS — N186 End stage renal disease: Secondary | ICD-10-CM | POA: Diagnosis not present

## 2024-07-14 DIAGNOSIS — Z992 Dependence on renal dialysis: Secondary | ICD-10-CM | POA: Diagnosis not present

## 2024-07-15 DIAGNOSIS — M1611 Unilateral primary osteoarthritis, right hip: Secondary | ICD-10-CM | POA: Diagnosis not present

## 2024-07-15 DIAGNOSIS — M1711 Unilateral primary osteoarthritis, right knee: Secondary | ICD-10-CM | POA: Diagnosis not present

## 2024-07-16 DIAGNOSIS — Z992 Dependence on renal dialysis: Secondary | ICD-10-CM | POA: Diagnosis not present

## 2024-07-16 DIAGNOSIS — N186 End stage renal disease: Secondary | ICD-10-CM | POA: Diagnosis not present

## 2024-07-22 DIAGNOSIS — M25569 Pain in unspecified knee: Secondary | ICD-10-CM | POA: Diagnosis not present

## 2024-07-22 DIAGNOSIS — Z96659 Presence of unspecified artificial knee joint: Secondary | ICD-10-CM | POA: Diagnosis not present

## 2024-07-22 DIAGNOSIS — M25552 Pain in left hip: Secondary | ICD-10-CM | POA: Diagnosis not present

## 2024-07-22 DIAGNOSIS — M25551 Pain in right hip: Secondary | ICD-10-CM | POA: Diagnosis not present

## 2024-07-22 DIAGNOSIS — G8929 Other chronic pain: Secondary | ICD-10-CM | POA: Diagnosis not present

## 2024-07-22 DIAGNOSIS — Z79899 Other long term (current) drug therapy: Secondary | ICD-10-CM | POA: Diagnosis not present

## 2024-07-23 DIAGNOSIS — N2581 Secondary hyperparathyroidism of renal origin: Secondary | ICD-10-CM | POA: Diagnosis not present

## 2024-07-23 DIAGNOSIS — N186 End stage renal disease: Secondary | ICD-10-CM | POA: Diagnosis not present

## 2024-07-23 DIAGNOSIS — Z992 Dependence on renal dialysis: Secondary | ICD-10-CM | POA: Diagnosis not present

## 2024-07-25 DIAGNOSIS — N2581 Secondary hyperparathyroidism of renal origin: Secondary | ICD-10-CM | POA: Diagnosis not present

## 2024-07-28 DIAGNOSIS — Z992 Dependence on renal dialysis: Secondary | ICD-10-CM | POA: Diagnosis not present

## 2024-07-28 DIAGNOSIS — N2581 Secondary hyperparathyroidism of renal origin: Secondary | ICD-10-CM | POA: Diagnosis not present

## 2024-07-28 DIAGNOSIS — N186 End stage renal disease: Secondary | ICD-10-CM | POA: Diagnosis not present

## 2024-07-31 DIAGNOSIS — M160 Bilateral primary osteoarthritis of hip: Secondary | ICD-10-CM | POA: Diagnosis not present

## 2024-08-01 DIAGNOSIS — N2581 Secondary hyperparathyroidism of renal origin: Secondary | ICD-10-CM | POA: Diagnosis not present

## 2024-08-01 DIAGNOSIS — N186 End stage renal disease: Secondary | ICD-10-CM | POA: Diagnosis not present

## 2024-08-01 DIAGNOSIS — Z992 Dependence on renal dialysis: Secondary | ICD-10-CM | POA: Diagnosis not present

## 2024-08-03 DIAGNOSIS — N2581 Secondary hyperparathyroidism of renal origin: Secondary | ICD-10-CM | POA: Diagnosis not present

## 2024-08-03 DIAGNOSIS — N186 End stage renal disease: Secondary | ICD-10-CM | POA: Diagnosis not present

## 2024-08-03 DIAGNOSIS — Z992 Dependence on renal dialysis: Secondary | ICD-10-CM | POA: Diagnosis not present

## 2024-08-08 DIAGNOSIS — Z992 Dependence on renal dialysis: Secondary | ICD-10-CM | POA: Diagnosis not present

## 2024-08-08 DIAGNOSIS — N186 End stage renal disease: Secondary | ICD-10-CM | POA: Diagnosis not present

## 2024-08-08 DIAGNOSIS — N2581 Secondary hyperparathyroidism of renal origin: Secondary | ICD-10-CM | POA: Diagnosis not present

## 2024-08-10 DIAGNOSIS — Z992 Dependence on renal dialysis: Secondary | ICD-10-CM | POA: Diagnosis not present

## 2024-08-10 DIAGNOSIS — N186 End stage renal disease: Secondary | ICD-10-CM | POA: Diagnosis not present

## 2024-08-10 DIAGNOSIS — E1122 Type 2 diabetes mellitus with diabetic chronic kidney disease: Secondary | ICD-10-CM | POA: Diagnosis not present

## 2024-08-12 ENCOUNTER — Emergency Department (HOSPITAL_COMMUNITY)

## 2024-08-12 ENCOUNTER — Inpatient Hospital Stay (HOSPITAL_COMMUNITY)

## 2024-08-12 ENCOUNTER — Other Ambulatory Visit: Payer: Self-pay

## 2024-08-12 ENCOUNTER — Encounter (HOSPITAL_COMMUNITY)
Admission: EM | Disposition: A | Discharge status: Skilled Nursing Facility | Payer: Self-pay | Source: Home / Self Care | Attending: Neurology

## 2024-08-12 ENCOUNTER — Emergency Department (HOSPITAL_COMMUNITY): Admitting: Registered Nurse

## 2024-08-12 ENCOUNTER — Inpatient Hospital Stay (HOSPITAL_COMMUNITY)
Admission: EM | Admit: 2024-08-12 | Discharge: 2024-08-20 | DRG: 023 | Disposition: A | Discharge status: Skilled Nursing Facility | Attending: Neurology | Admitting: Neurology

## 2024-08-12 DIAGNOSIS — R930 Abnormal findings on diagnostic imaging of skull and head, not elsewhere classified: Secondary | ICD-10-CM | POA: Diagnosis not present

## 2024-08-12 DIAGNOSIS — I639 Cerebral infarction, unspecified: Secondary | ICD-10-CM | POA: Diagnosis present

## 2024-08-12 DIAGNOSIS — E785 Hyperlipidemia, unspecified: Secondary | ICD-10-CM | POA: Diagnosis not present

## 2024-08-12 DIAGNOSIS — N186 End stage renal disease: Secondary | ICD-10-CM

## 2024-08-12 DIAGNOSIS — M438X2 Other specified deforming dorsopathies, cervical region: Secondary | ICD-10-CM | POA: Diagnosis not present

## 2024-08-12 DIAGNOSIS — R29707 NIHSS score 7: Secondary | ICD-10-CM | POA: Diagnosis not present

## 2024-08-12 DIAGNOSIS — E039 Hypothyroidism, unspecified: Secondary | ICD-10-CM

## 2024-08-12 DIAGNOSIS — M47812 Spondylosis without myelopathy or radiculopathy, cervical region: Secondary | ICD-10-CM | POA: Diagnosis not present

## 2024-08-12 DIAGNOSIS — G4089 Other seizures: Secondary | ICD-10-CM | POA: Diagnosis not present

## 2024-08-12 DIAGNOSIS — I120 Hypertensive chronic kidney disease with stage 5 chronic kidney disease or end stage renal disease: Secondary | ICD-10-CM | POA: Diagnosis not present

## 2024-08-12 DIAGNOSIS — Z8673 Personal history of transient ischemic attack (TIA), and cerebral infarction without residual deficits: Secondary | ICD-10-CM | POA: Diagnosis present

## 2024-08-12 DIAGNOSIS — R29712 NIHSS score 12: Secondary | ICD-10-CM | POA: Diagnosis not present

## 2024-08-12 DIAGNOSIS — W19XXXA Unspecified fall, initial encounter: Secondary | ICD-10-CM | POA: Diagnosis not present

## 2024-08-12 DIAGNOSIS — S2231XA Fracture of one rib, right side, initial encounter for closed fracture: Secondary | ICD-10-CM | POA: Diagnosis not present

## 2024-08-12 DIAGNOSIS — I517 Cardiomegaly: Secondary | ICD-10-CM | POA: Diagnosis not present

## 2024-08-12 DIAGNOSIS — R220 Localized swelling, mass and lump, head: Secondary | ICD-10-CM | POA: Diagnosis not present

## 2024-08-12 DIAGNOSIS — R29818 Other symptoms and signs involving the nervous system: Secondary | ICD-10-CM | POA: Diagnosis not present

## 2024-08-12 DIAGNOSIS — R4781 Slurred speech: Secondary | ICD-10-CM | POA: Diagnosis not present

## 2024-08-12 DIAGNOSIS — R531 Weakness: Secondary | ICD-10-CM | POA: Diagnosis not present

## 2024-08-12 DIAGNOSIS — I6782 Cerebral ischemia: Secondary | ICD-10-CM | POA: Diagnosis not present

## 2024-08-12 DIAGNOSIS — I63411 Cerebral infarction due to embolism of right middle cerebral artery: Secondary | ICD-10-CM

## 2024-08-12 DIAGNOSIS — R29725 NIHSS score 25: Secondary | ICD-10-CM | POA: Diagnosis not present

## 2024-08-12 DIAGNOSIS — I69391 Dysphagia following cerebral infarction: Secondary | ICD-10-CM | POA: Diagnosis not present

## 2024-08-12 DIAGNOSIS — I10 Essential (primary) hypertension: Secondary | ICD-10-CM | POA: Diagnosis not present

## 2024-08-12 DIAGNOSIS — Z992 Dependence on renal dialysis: Secondary | ICD-10-CM | POA: Diagnosis not present

## 2024-08-12 DIAGNOSIS — I63511 Cerebral infarction due to unspecified occlusion or stenosis of right middle cerebral artery: Principal | ICD-10-CM

## 2024-08-12 DIAGNOSIS — G936 Cerebral edema: Secondary | ICD-10-CM | POA: Diagnosis not present

## 2024-08-12 DIAGNOSIS — M5030 Other cervical disc degeneration, unspecified cervical region: Secondary | ICD-10-CM | POA: Diagnosis not present

## 2024-08-12 DIAGNOSIS — R29810 Facial weakness: Secondary | ICD-10-CM | POA: Diagnosis not present

## 2024-08-12 DIAGNOSIS — G9389 Other specified disorders of brain: Secondary | ICD-10-CM | POA: Diagnosis not present

## 2024-08-12 DIAGNOSIS — I6523 Occlusion and stenosis of bilateral carotid arteries: Secondary | ICD-10-CM | POA: Diagnosis not present

## 2024-08-12 DIAGNOSIS — E119 Type 2 diabetes mellitus without complications: Secondary | ICD-10-CM

## 2024-08-12 DIAGNOSIS — R110 Nausea: Secondary | ICD-10-CM | POA: Diagnosis not present

## 2024-08-12 DIAGNOSIS — I6601 Occlusion and stenosis of right middle cerebral artery: Secondary | ICD-10-CM | POA: Diagnosis not present

## 2024-08-12 DIAGNOSIS — R410 Disorientation, unspecified: Secondary | ICD-10-CM | POA: Diagnosis not present

## 2024-08-12 DIAGNOSIS — R569 Unspecified convulsions: Secondary | ICD-10-CM | POA: Diagnosis not present

## 2024-08-12 DIAGNOSIS — M4802 Spinal stenosis, cervical region: Secondary | ICD-10-CM | POA: Diagnosis not present

## 2024-08-12 DIAGNOSIS — S0990XA Unspecified injury of head, initial encounter: Secondary | ICD-10-CM | POA: Diagnosis not present

## 2024-08-12 DIAGNOSIS — I739 Peripheral vascular disease, unspecified: Secondary | ICD-10-CM | POA: Diagnosis not present

## 2024-08-12 DIAGNOSIS — I6389 Other cerebral infarction: Secondary | ICD-10-CM | POA: Diagnosis not present

## 2024-08-12 HISTORY — PX: RADIOLOGY WITH ANESTHESIA: SHX6223

## 2024-08-12 HISTORY — PX: IR CT HEAD LTD: IMG2386

## 2024-08-12 HISTORY — PX: IR PERCUTANEOUS ART THROMBECTOMY/INFUSION INTRACRANIAL INC DIAG ANGIO: IMG6087

## 2024-08-12 HISTORY — PX: IR US GUIDE VASC ACCESS RIGHT: IMG2390

## 2024-08-12 LAB — I-STAT CHEM 8, ED
BUN: 70 mg/dL — ABNORMAL HIGH (ref 8–23)
Calcium, Ion: 1.18 mmol/L (ref 1.15–1.40)
Chloride: 103 mmol/L (ref 98–111)
Creatinine, Ser: 11.1 mg/dL — ABNORMAL HIGH (ref 0.44–1.00)
Glucose, Bld: 101 mg/dL — ABNORMAL HIGH (ref 70–99)
HCT: 34 % — ABNORMAL LOW (ref 36.0–46.0)
Hemoglobin: 11.6 g/dL — ABNORMAL LOW (ref 12.0–15.0)
Potassium: 4.8 mmol/L (ref 3.5–5.1)
Sodium: 138 mmol/L (ref 135–145)
TCO2: 22 mmol/L (ref 22–32)

## 2024-08-12 LAB — DIFFERENTIAL
Abs Immature Granulocytes: 0.05 K/uL (ref 0.00–0.07)
Basophils Absolute: 0 K/uL (ref 0.0–0.1)
Basophils Relative: 0 %
Eosinophils Absolute: 0 K/uL (ref 0.0–0.5)
Eosinophils Relative: 0 %
Immature Granulocytes: 1 %
Lymphocytes Relative: 8 %
Lymphs Abs: 0.8 K/uL (ref 0.7–4.0)
Monocytes Absolute: 0.5 K/uL (ref 0.1–1.0)
Monocytes Relative: 5 %
Neutro Abs: 8.4 K/uL — ABNORMAL HIGH (ref 1.7–7.7)
Neutrophils Relative %: 86 %

## 2024-08-12 LAB — COMPREHENSIVE METABOLIC PANEL WITH GFR
ALT: 9 U/L (ref 0–44)
AST: 17 U/L (ref 15–41)
Albumin: 3.5 g/dL (ref 3.5–5.0)
Alkaline Phosphatase: 332 U/L — ABNORMAL HIGH (ref 38–126)
Anion gap: 17 — ABNORMAL HIGH (ref 5–15)
BUN: 77 mg/dL — ABNORMAL HIGH (ref 8–23)
CO2: 21 mmol/L — ABNORMAL LOW (ref 22–32)
Calcium: 9.5 mg/dL (ref 8.9–10.3)
Chloride: 101 mmol/L (ref 98–111)
Creatinine, Ser: 10.94 mg/dL — ABNORMAL HIGH (ref 0.44–1.00)
GFR, Estimated: 3 mL/min — ABNORMAL LOW (ref >60)
Glucose, Bld: 107 mg/dL — ABNORMAL HIGH (ref 70–99)
Potassium: 4.8 mmol/L (ref 3.5–5.1)
Sodium: 139 mmol/L (ref 135–145)
Total Bilirubin: 0.8 mg/dL (ref 0.0–1.2)
Total Protein: 6.6 g/dL (ref 6.5–8.1)

## 2024-08-12 LAB — CBC
HCT: 34.3 % — ABNORMAL LOW (ref 36.0–46.0)
Hemoglobin: 10.5 g/dL — ABNORMAL LOW (ref 12.0–15.0)
MCH: 31.6 pg (ref 26.0–34.0)
MCHC: 30.6 g/dL (ref 30.0–36.0)
MCV: 103.3 fL — ABNORMAL HIGH (ref 80.0–100.0)
Platelets: 213 K/uL (ref 150–400)
RBC: 3.32 MIL/uL — ABNORMAL LOW (ref 3.87–5.11)
RDW: 14.1 % (ref 11.5–15.5)
WBC: 9.8 K/uL (ref 4.0–10.5)
nRBC: 0 % (ref 0.0–0.2)

## 2024-08-12 LAB — ETHANOL: Alcohol, Ethyl (B): <15 mg/dL (ref <15)

## 2024-08-12 LAB — PROTIME-INR
INR: 1 (ref 0.8–1.2)
Prothrombin Time: 13.6 s (ref 11.4–15.2)

## 2024-08-12 LAB — MRSA NEXT GEN BY PCR, NASAL: MRSA by PCR Next Gen: NOT DETECTED

## 2024-08-12 LAB — GLUCOSE, CAPILLARY
Glucose-Capillary: 126 mg/dL — ABNORMAL HIGH (ref 70–99)
Glucose-Capillary: 183 mg/dL — ABNORMAL HIGH (ref 70–99)

## 2024-08-12 LAB — CBG MONITORING, ED: Glucose-Capillary: 103 mg/dL — ABNORMAL HIGH (ref 70–99)

## 2024-08-12 LAB — APTT: aPTT: 27 s (ref 24–36)

## 2024-08-12 MED ORDER — SODIUM BICARBONATE 650 MG PO TABS
650.0000 mg | ORAL_TABLET | Freq: Two times a day (BID) | ORAL | Status: DC
  Administered 2024-08-14 – 2024-08-20 (×12): 650 mg via ORAL
  Filled 2024-08-12 (×12): qty 1

## 2024-08-12 MED ORDER — ONDANSETRON HCL 4 MG/2ML IJ SOLN
INTRAMUSCULAR | Status: DC | PRN
  Administered 2024-08-12: 4 mg via INTRAVENOUS

## 2024-08-12 MED ORDER — EPHEDRINE SULFATE-NACL 50-0.9 MG/10ML-% IV SOSY
PREFILLED_SYRINGE | INTRAVENOUS | Status: DC | PRN
  Administered 2024-08-12: 10 mg via INTRAVENOUS
  Administered 2024-08-12: 5 mg via INTRAVENOUS

## 2024-08-12 MED ORDER — CHLORHEXIDINE GLUCONATE CLOTH 2 % EX PADS
6.0000 | MEDICATED_PAD | Freq: Every day | CUTANEOUS | Status: DC
  Administered 2024-08-13 – 2024-08-20 (×5): 6 via TOPICAL

## 2024-08-12 MED ORDER — SODIUM CHLORIDE 0.9 % IV BOLUS: 250.0000 mL | INTRAVENOUS | Status: AC | PRN

## 2024-08-12 MED ORDER — ACETAMINOPHEN 160 MG/5ML PO SOLN
650.0000 mg | ORAL | Status: DC | PRN
  Administered 2024-08-17: 650 mg
  Filled 2024-08-12: qty 20.3

## 2024-08-12 MED ORDER — IOHEXOL 350 MG/ML SOLN
100.0000 mL | Freq: Once | INTRAVENOUS | Status: AC | PRN
  Administered 2024-08-12: 100 mL via INTRAVENOUS

## 2024-08-12 MED ORDER — LIDOCAINE 2% (20 MG/ML) 5 ML SYRINGE
INTRAMUSCULAR | Status: DC | PRN
  Administered 2024-08-12: 40 mg via INTRAVENOUS

## 2024-08-12 MED ORDER — SODIUM CHLORIDE 0.9 % IV SOLN: INTRAVENOUS | Status: DC

## 2024-08-12 MED ORDER — PHENYLEPHRINE HCL-NACL 20-0.9 MG/250ML-% IV SOLN
INTRAVENOUS | Status: DC | PRN
  Administered 2024-08-12: 40 ug/min via INTRAVENOUS

## 2024-08-12 MED ORDER — PHENYLEPHRINE 80 MCG/ML (10ML) SYRINGE FOR IV PUSH (FOR BLOOD PRESSURE SUPPORT)
PREFILLED_SYRINGE | INTRAVENOUS | Status: DC | PRN
  Administered 2024-08-12 (×2): 160 ug via INTRAVENOUS
  Administered 2024-08-12: 80 ug via INTRAVENOUS
  Administered 2024-08-12: 160 ug via INTRAVENOUS

## 2024-08-12 MED ORDER — IOHEXOL 300 MG/ML  SOLN
100.0000 mL | Freq: Once | INTRAMUSCULAR | Status: AC | PRN
  Administered 2024-08-12: 70 mL via INTRA_ARTERIAL

## 2024-08-12 MED ORDER — ACETAMINOPHEN 325 MG PO TABS
650.0000 mg | ORAL_TABLET | ORAL | Status: DC | PRN
  Administered 2024-08-19 (×2): 650 mg via ORAL
  Filled 2024-08-12 (×5): qty 2

## 2024-08-12 MED ORDER — INSULIN ASPART 100 UNIT/ML IJ SOLN
0.0000 [IU] | INTRAMUSCULAR | Status: DC
  Administered 2024-08-12: 1 [IU] via SUBCUTANEOUS

## 2024-08-12 MED ORDER — DEXAMETHASONE SOD PHOSPHATE PF 10 MG/ML IJ SOLN
INTRAMUSCULAR | Status: DC | PRN
  Administered 2024-08-12: 5 mg via INTRAVENOUS

## 2024-08-12 MED ORDER — SUGAMMADEX SODIUM 200 MG/2ML IV SOLN
INTRAVENOUS | Status: DC | PRN
  Administered 2024-08-12: 50 mg via INTRAVENOUS
  Administered 2024-08-12: 200 mg via INTRAVENOUS

## 2024-08-12 MED ORDER — CLEVIDIPINE BUTYRATE 0.5 MG/ML IV EMUL
0.0000 mg/h | INTRAVENOUS | Status: DC
  Administered 2024-08-12: 2 mg/h via INTRAVENOUS

## 2024-08-12 MED ORDER — ROCURONIUM BROMIDE 10 MG/ML (PF) SYRINGE
PREFILLED_SYRINGE | INTRAVENOUS | Status: DC | PRN
  Administered 2024-08-12: 20 mg via INTRAVENOUS
  Administered 2024-08-12: 40 mg via INTRAVENOUS

## 2024-08-12 MED ORDER — ATORVASTATIN CALCIUM 40 MG PO TABS
40.0000 mg | ORAL_TABLET | Freq: Every day | ORAL | Status: DC
  Administered 2024-08-15 – 2024-08-20 (×6): 40 mg via ORAL
  Filled 2024-08-12 (×2): qty 4
  Filled 2024-08-12 (×2): qty 1
  Filled 2024-08-12: qty 4
  Filled 2024-08-12: qty 1

## 2024-08-12 MED ORDER — HYDROMORPHONE HCL 1 MG/ML IJ SOLN
0.5000 mg | Freq: Once | INTRAMUSCULAR | Status: AC
  Administered 2024-08-12: 0.5 mg via INTRAVENOUS
  Filled 2024-08-12: qty 1

## 2024-08-12 MED ORDER — SENNOSIDES-DOCUSATE SODIUM 8.6-50 MG PO TABS
1.0000 | ORAL_TABLET | Freq: Every evening | ORAL | Status: DC | PRN
  Administered 2024-08-19: 1 via ORAL
  Filled 2024-08-12: qty 1

## 2024-08-12 MED ORDER — SODIUM CHLORIDE 0.9% FLUSH: 3.0000 mL | Freq: Once | INTRAVENOUS | Status: DC

## 2024-08-12 MED ORDER — PROPOFOL 10 MG/ML IV BOLUS
INTRAVENOUS | Status: DC | PRN
  Administered 2024-08-12: 20 mg via INTRAVENOUS
  Administered 2024-08-12: 80 mg via INTRAVENOUS

## 2024-08-12 MED ORDER — STROKE: EARLY STAGES OF RECOVERY BOOK
Freq: Once | Status: AC
  Filled 2024-08-12: qty 1

## 2024-08-12 MED ORDER — HEPARIN SODIUM (PORCINE) 5000 UNIT/ML IJ SOLN
5000.0000 [IU] | Freq: Three times a day (TID) | INTRAMUSCULAR | Status: DC
  Administered 2024-08-13 – 2024-08-15 (×10): 5000 [IU] via SUBCUTANEOUS
  Filled 2024-08-12 (×10): qty 1

## 2024-08-12 MED ORDER — ACETAMINOPHEN 650 MG RE SUPP: 650.0000 mg | RECTAL | Status: DC | PRN

## 2024-08-12 MED ORDER — CLEVIDIPINE BUTYRATE 0.5 MG/ML IV EMUL
INTRAVENOUS | Status: AC
  Filled 2024-08-12: qty 100

## 2024-08-12 MED ORDER — LEVOTHYROXINE SODIUM 75 MCG PO TABS
75.0000 ug | ORAL_TABLET | Freq: Every day | ORAL | Status: DC
  Administered 2024-08-15 – 2024-08-20 (×6): 75 ug via ORAL
  Filled 2024-08-12 (×5): qty 1

## 2024-08-12 NOTE — Procedures (Signed)
  NEUROSURGERY BRIEF THROMBECTOMY NOTE   PREOP DX: Acute ischemic stroke  POSTOP DX: Same  PROCEDURE: Right M1 thrombectomy  SURGEON: Mical Brun, and Heck, Don  ANESTHESIA: GETA  EBL: 250 mL  Number of Passes: 1  Technique: ASPIRATION WITH STENT RETRIEVER  Final TICI score: 3  Post OP blood pressure goal: SBP<180  Arterial Angioplasty or Stent: No   Anti-Platelet Therapy: No   COMPLICATIONS: No   CONDITION: Stable to recovery  FINDINGS (Full report in [facility]): 1. Difficult and prolonged procedure secondary to tortuous arch anatomy.  Both femoral and radial accesses obtained. 2. Successful recanalization. 3. To recovery in stable condition.   Barth Trella  @today @ 3:39 PM

## 2024-08-12 NOTE — ED Triage Notes (Signed)
 Patient arrives via [facility] as a code stroke. Patient is from home with her daughter. LKW 2030 last night. This morning at 1015 patient called her daughter explaining something was wrong. Patient was found in floor with a fall from bed. Daughter found patient to have left side weakness, left side facial droop, and slurred speech. C-collar for precaution placed by EMS. Redness noted to head by EMS. Dialysis patient who missed yesterday. No thinners.   EMS vitals BP 160/70 HR 74 with occasional PVCs CBG 143 GCS 15  Patient arrives without IV access.

## 2024-08-12 NOTE — Progress Notes (Signed)
 Notified Dr. Aron of C-collar on Pt and if we could clear Pt to take it off as Pt keeps pulling it off and is uncomfortable. Stated they will look at imaging and see if Pt can be cleared. Foam lift dressing appled to prevent pressure injuries.

## 2024-08-12 NOTE — H&P (Signed)
 NEUROLOGY H&P NOTE   Date of service: August 12, 2024 Patient Name: Norma Coleman MRN:  [id number] DOB:  1932-09-17 Chief Complaint: Left-sided weakness and dysarthria  History of Present Illness  Norma Coleman is a 89 y.o. female with hx of end-stage renal disease on dialysis, hypertension, hyperlipidemia and hypothyroidism who presents with acute onset left-sided weakness, left hemianopsia and dysarthria.  She was in her usual state of health when she went to bed last night at 8 PM, but called her daughter this morning to state that she had fallen out of bed and struck her head on the nightstand.  Her daughter noted that she had left-sided weakness and dysarthria and called EMS.  Initial head CT demonstrated no acute abnormalities, but CT angiogram demonstrated left MCA occlusion.  Of note, she missed dialysis yesterday due to not feeling well.  Last known well: 12/1 8 PM Modified rankin score: 1-No significant post stroke disability and can perform usual duties with stroke symptoms IV Thrombolysis: No, outside of window Thrombectomy: Yes, right MCA occlusion NIHSS components Score: Comment  1a Level of Conscious 0[x]  1[]  2[]  3[]      1b LOC Questions 0[x]  1[]  2[]       1c LOC Commands 0[x]  1[]  2[]       2 Best Gaze 0[x]  1[]  2[]       3 Visual 0[]  1[]  2[x]  3[]      4 Facial Palsy 0[]  1[x]  2[]  3[]      5a Motor Arm - left 0[]  1[x]  2[]  3[]  4[]  UN[]    5b Motor Arm - Right 0[x]  1[]  2[]  3[]  4[]  UN[]    6a Motor Leg - Left 0[]  1[x]  2[]  3[]  4[]  UN[]    6b Motor Leg - Right 0[x]  1[]  2[]  3[]  4[]  UN[]    7 Limb Ataxia 0[x]  1[]  2[]  UN[]      8 Sensory 0[x]  1[]  2[]  UN[]      9 Best Language 0[]  1[x]  2[]  3[]      10 Dysarthria 0[]  1[x]  2[]  UN[]      11 Extinct. and Inattention 0[x]  1[]  2[]       TOTAL:7       ROS  Focused ROS performed due to acuity of complaint  Past History   Past Medical History:  Diagnosis Date   DVT (deep venous thrombosis) (HCC)    GI bleed due to NSAIDs    High  cholesterol    Hypertension    Hyperthyroidism    Pneumonia    Skin cancer    Thyroid  disease    Past Surgical History:  Procedure Laterality Date   A/V SHUNT INTERVENTION Left 05/08/2024   Procedure: A/V SHUNT INTERVENTION;  Surgeon: Sheree Penne Bruckner, MD;  Location: [facility];  Service: Cardiovascular;  Laterality: Left;   IR FLUORO GUIDE CV LINE RIGHT  09/18/2022   IR US  GUIDE VASC ACCESS RIGHT  09/18/2022   KNEE SURGERY     TONSILLECTOMY     TUBAL LIGATION     VENOUS ANGIOPLASTY  05/08/2024   Procedure: VENOUS ANGIOPLASTY;  Surgeon: Sheree Penne Bruckner, MD;  Location: [facility];  Service: Cardiovascular;;   No family history on file. Social History   Socioeconomic History   Marital status: Widowed    Spouse name: Not on file   Number of children: Not on file   Years of education: Not on file   Highest education level: Not on file  Occupational History   Not on file  Tobacco Use   Smoking status: Never   Smokeless  tobacco: Never  Substance and Sexual Activity   Alcohol use: Never   Drug use: Never   Sexual activity: Not Currently  Other Topics Concern   Not on file  Social History Narrative   Not on file   Social Drivers of Health   Financial Resource Strain: Not on file  Food Insecurity: No Food Insecurity (09/12/2022)   Hunger Vital Sign    Worried About Running Out of Food in the Last Year: Never true    Ran Out of Food in the Last Year: Never true  Transportation Needs: No Transportation Needs (09/12/2022)   PRAPARE - Administrator, Civil Service (Medical): No    Lack of Transportation (Non-Medical): No  Physical Activity: Not on file  Stress: Not on file  Social Connections: Not on file   Allergies  Allergen Reactions   Ibuprofen Other (See Comments)    Stomach bleed   Sulfa Antibiotics     Medications   Current Facility-Administered Medications:    [START ON 08/13/2024]  stroke: early stages of recovery book, , Does not  apply, Once, [employer], Cortney E, NP   0.9 %  sodium chloride  infusion, , Intravenous, Continuous, de La Torre, Cortney E, NP   acetaminophen  (TYLENOL ) tablet 650 mg, 650 mg, Oral, Q4H PRN **OR** acetaminophen  (TYLENOL ) 160 MG/5ML solution 650 mg, 650 mg, Per Tube, Q4H PRN **OR** acetaminophen  (TYLENOL ) suppository 650 mg, 650 mg, Rectal, Q4H PRN, de Clint Kill, Cortney E, NP   atorvastatin  (LIPITOR) tablet 40 mg, 40 mg, Oral, Daily, [employer], Cortney E, NP   heparin  injection 5,000 Units, 5,000 Units, Subcutaneous, Q8H, de La Torre, Rouse E, NP   [START ON 08/13/2024] levothyroxine  (SYNTHROID ) tablet 75 mcg, 75 mcg, Oral, QAC breakfast, de Clint Kill, Cortney E, NP   senna-docusate (Senokot-S) tablet 1 tablet, 1 tablet, Oral, QHS PRN, de Clint Kill, Cortney E, NP   sodium bicarbonate  tablet 650 mg, 650 mg, Oral, BID, de La Torre, Cortney E, NP   sodium chloride  flush (NS) 0.9 % injection 3 mL, 3 mL, Intravenous, Once, Franklyn Sid SAILOR, MD  Current Outpatient Medications:    ALPRAZolam  (XANAX ) 1 MG tablet, Take 1 mg by mouth at bedtime as needed for anxiety., Disp: , Rfl:    amLODipine  (NORVASC ) 10 MG tablet, Take 10 mg by mouth daily., Disp: , Rfl:    ascorbic acid (VITAMIN C) 500 MG tablet, Take 500 mg by mouth daily., Disp: , Rfl:    atorvastatin  (LIPITOR) 40 MG tablet, Take 40 mg by mouth daily., Disp: , Rfl:    benzonatate  (TESSALON ) 100 MG capsule, Take 1 capsule (100 mg total) by mouth 3 (three) times daily as needed for cough., Disp: 60 capsule, Rfl: 0   Biotin 5 MG CAPS, Take 5 mg by mouth daily., Disp: , Rfl:    carvedilol  (COREG ) 3.125 MG tablet, Take 1 tablet by mouth 2 (two) times daily with a meal., Disp: , Rfl:    cholecalciferol (VITAMIN D3) 25 MCG (1000 UNIT) tablet, Take 1,000 Units by mouth daily., Disp: , Rfl:    DM-Doxylamine-Acetaminophen  (NYQUIL COLD & FLU PO), Take 5 mLs by mouth daily as needed (cough)., Disp: , Rfl:    hydrALAZINE  (APRESOLINE ) 50 MG tablet, Take 50 mg by  mouth 3 (three) times daily., Disp: , Rfl:    levothyroxine  (SYNTHROID ) 75 MCG tablet, Take 75 mcg by mouth daily before breakfast., Disp: , Rfl:    Multiple Vitamins-Minerals (MULTIVITAMIN WITH MINERALS) tablet,  Take 1 tablet by mouth daily., Disp: , Rfl:    oxyCODONE (OXY IR/ROXICODONE) 5 MG immediate release tablet, Take 5 mg by mouth daily as needed for moderate pain or severe pain., Disp: , Rfl:    sodium bicarbonate  650 MG tablet, Take 650 mg by mouth 2 (two) times daily., Disp: , Rfl:    vitamin B-12 (CYANOCOBALAMIN ) 100 MCG tablet, Take 100 mcg by mouth daily., Disp: , Rfl:   Facility-Administered Medications Ordered in Other Encounters:    propofol (DIPRIVAN) 10 mg/mL bolus/IV push, , Intravenous, Anesthesia Intra-op, Christopher Comings, CRNA, 20 mg at 08/12/24 1321   rocuronium (ZEMURON) injection, , Intravenous, Anesthesia Intra-op, Eady, Arese, CRNA, 40 mg at 08/12/24 1321   Vitals   Vitals:   08/12/24 1200  Weight: 60 kg     Body mass index is 23.43 kg/m.  Physical Exam   Constitutional: Appears well-developed and well-nourished.  Psych: Affect appropriate to situation.  Eyes: No scleral injection. HENT: No OP obstruction.  Head: Normocephalic.  Respiratory: Effort normal, non-labored breathing.  Skin: WDI.   Neurologic Examination    NEURO:  Mental Status: Patient is alert and oriented to person place time and situation Speech/Language: speech is with mild dysarthria and mild, questionable expressive aphasia  Cranial Nerves:  II: PERRL.  Left hemianopsia III, IV, VI: EOMI. Eyelids elevate symmetrically.  V: Sensation is intact to light touch and symmetrical to face.  VII: Left facial droop VIII: hearing intact to voice. IX, X: Voice is mildly dysarthric XII: tongue is midline without fasciculations. Motor: Able to move all 4 extremities with antigravity strength, but drift present in left upper and lower extremities Tone: is normal and bulk is normal Sensation-  Intact to light touch bilaterally. Extinction absent to light touch to DSS.  Coordination: FTN intact bilaterally Gait- deferred   Labs   CBC:  Recent Labs  Lab 08/12/24 1233 08/12/24 1235  WBC 9.8  --   NEUTROABS 8.4*  --   HGB 10.5* 11.6*  HCT 34.3* 34.0*  MCV 103.3*  --   PLT 213  --    Basic Metabolic Panel:  Lab Results  Component Value Date   NA 138 08/12/2024   K 4.8 08/12/2024   CO2 21 (L) 08/12/2024   GLUCOSE 101 (H) 08/12/2024   BUN 70 (H) 08/12/2024   CREATININE 11.10 (H) 08/12/2024   CALCIUM  9.5 08/12/2024   GFRNONAA 3 (L) 08/12/2024   Lipid Panel: No results found for: LDLCALC HgbA1c:  Lab Results  Component Value Date   HGBA1C 5.5 08/05/2022   Urine Drug Screen: No results found for: LABOPIA, COCAINSCRNUR, LABBENZ, AMPHETMU, THCU, LABBARB  Alcohol Level     Component Value Date/Time   [facility] <15 08/12/2024 1233   INR  Lab Results  Component Value Date   INR 1.0 08/12/2024   APTT  Lab Results  Component Value Date   APTT 27 08/12/2024     CT Head without contrast(Personally reviewed): Acute right MCA infarct involving frontoparietal operculum, aspects 9, chronic small vessel ischemic disease, 1 cm partially calcified right cerebellopontine angle mass compatible with meningioma   CT angio Head and Neck with contrast(Personally reviewed): Acute right MCA occlusion, large penumbra and no core seen on CT perfusion  MRI Brain(Personally reviewed): Pending  Assessment   BRITTINIE WHERLEY is a 89 y.o. female with history of hypertension, hyperlipidemia, hypothyroidism and end-stage renal disease on dialysis who presents with acute onset left-sided weakness, dysarthria and left hemianopsia.  Patient was last  seen to be normal last night at 8 PM and was found to have fallen out of bed this morning by her daughter.  Initial head CT demonstrated acute right MCA territory infarct, and CT angiogram demonstrated right MCA occlusion.  She was  taken to interventional radiology for mechanical thrombectomy.  Her daughter would like for her to be full code at this time.  Primary Diagnosis:  Cerebral infarction due to thrombosis of right middle cerebral artery.   Secondary Diagnosis: CKD Stage 5 (GFR < 15)  Recommendations   - Admit to ICU for post mechanical thrombectomy monitoring - Blood pressure parameters and groin site care to be set by interventional radiologist, likely keep systolic blood pressure between 120 and 160 for 24 hours, then less than 180 - MRI brain wo contrast - TTE  - Check A1c and LDL + add statin per guidelines - antiplt/anticoag per neurointerventional radiology for the first 24 hours - Neurochecks every 15 minutes x 2 hours, q. 30 minutes x 6 hours and hourly thereafter - STAT head CT for any change in neuro exam - Tele - PT/OT/SLP - Stroke education - Amb referral to neurology upon discharge   ______________________________________________________________________ Patient seen by NP with MD, MD to edit note as needed.  Signed, Cortney E Everitt Clint Kill, NP [facility]   NEUROHOSPITALIST ADDENDUM Performed a face to face diagnostic evaluation.   I have reviewed the contents of history and physical exam as documented by PA/ARNP/Resident and agree with above documentation.  I have discussed and formulated the above plan as documented. Edits to the note have been made as needed.  Impression/Key exam findings/Plan: mild L sided weakness but complete L hemianopsia. CT Head with ASPECTS of 9. CTA with R MCA inferior division occlusion. CT Perfusion with Per daughter, she is functional and except for driving can do most ADLs on her own. Given disabling vision deficit, thrombectomy was discussed with patient's daughter over phone. This was discussed by Dr. Ray and I witnessed the entirety of the conversation. All questions were answered and daughter verbally consented to thrombectomy. She was  outside the window for tnkase and therefore not offered tnkase.  Antiplatelet and BP goal for the first 24 hours per Neuro IR.  This patient is critically ill and at significant risk of neurological worsening, death and care requires constant monitoring of vital signs, hemodynamics,respiratory and cardiac monitoring, neurological assessment, discussion with family, other specialists and medical decision making of high complexity. I spent 45 minutes of neurocritical care time  in the care of  this patient. This was time spent independent of any time provided by nurse practitioner or PA.  Deanthony Maull [facility] 08/12/2024  2:47 PM   Ellouise Mari, MD [facility] [phone]   If 7pm to 7am, please call on call as listed on AMION.

## 2024-08-12 NOTE — Anesthesia Procedure Notes (Signed)
 Procedure Name: Intubation Date/Time: 08/12/2024 1:23 PM  Performed by: Christopher Comings, CRNAPre-anesthesia Checklist: Patient identified, Emergency Drugs available, Suction available and Patient being monitored Patient Re-evaluated:Patient Re-evaluated prior to induction Oxygen Delivery Method: Circle system utilized Preoxygenation: Pre-oxygenation with 100% oxygen Induction Type: IV induction Ventilation: Mask ventilation without difficulty Laryngoscope Size: McGrath and 3 Grade View: Grade I Tube type: Oral Tube size: 7.0 mm Number of attempts: 1 Airway Equipment and Method: Stylet and Oral airway Placement Confirmation: ETT inserted through vocal cords under direct vision, positive ETCO2 and breath sounds checked- equal and bilateral Secured at: 23 cm Tube secured with: Tape Dental Injury: Teeth and Oropharynx as per pre-operative assessment  Comments: C collar remains in place throughout case.

## 2024-08-12 NOTE — Transfer of Care (Signed)
 Immediate Anesthesia Transfer of Care Note  Patient: Norma Coleman  Procedure(s) Performed: RADIOLOGY WITH ANESTHESIA IR PERCUTANEOUS ART THROMBECTOMY/INFUSION INTRACRANIAL INC DIAG ANGIO IR US  GUIDE VASC ACCESS RIGHT IR CT HEAD LTD  Patient Location: PACU  Anesthesia Type:General  Level of Consciousness: drowsy and patient cooperative  Airway & Oxygen Therapy: Patient connected to face mask oxygen  Post-op Assessment: Report given to RN and Post -op Vital signs reviewed and stable  Post vital signs: Reviewed and stable  Last Vitals:  Vitals Value Taken Time  BP 183/66 08/12/24 16:04  Temp    Pulse 81 08/12/24 16:09  Resp 11 08/12/24 16:09  SpO2 100 % 08/12/24 16:09  Vitals shown include unfiled device data.  Last Pain:  Vitals:   08/12/24 1352  PainSc: 5          Complications: There were no known notable events for this encounter.

## 2024-08-12 NOTE — Progress Notes (Incomplete)
 NIH Stroke Scale   Interval: {nihss interval:17994} Time: 9:34 PM Person Administering Scale: Lyle DELENA Norlander     1a  Level of consciousness: 0=alert; keenly responsive  1b. LOC questions:  0=Performs both tasks correctly  1c. LOC commands: 0=Performs both tasks correctly  2.  Best Gaze: 0=normal  3.  Visual: 0=No visual loss  4. Facial Palsy: 1=Minor paralysis (flattened nasolabial fold, asymmetric on smiling)  5a.  Motor left arm: 1=Drift, limb holds 90 (or 45) degrees but drifts down before full 10 seconds: does not hit bed  5b.  Motor right arm: 0=No drift, limb holds 90 (or 45) degrees for full 10 seconds  6a. motor left leg: 2=Some effort against gravity, limb cannot get to or maintain (if cured) 90 (or 45) degrees, drifts down to bed, but has some effort against gravity  6b  Motor right leg:  2=Some effort against gravity, limb cannot get to or maintain (if cured) 90 (or 45) degrees, drifts down to bed, but has some effort against gravity  7. Limb Ataxia: 0=Absent  8.  Sensory: 0=Normal; no sensory loss  9. Best Language:  0=No aphasia, normal  10. Dysarthria: 1=Mild to moderate, patient slurs at least some words and at worst, can be understood with some difficulty  11. Extinction and Inattention: 1=Visual, tactile, auditory, spatial or personal inattention or extinction to bilateral simultaneous stimulation in one of the sensory modalities       Total:    8

## 2024-08-12 NOTE — Progress Notes (Signed)
 Orthopedic Tech Progress Note Patient Details:  Norma Coleman [birth year] [id number]  Ortho Devices Type of Ortho Device: Knee Immobilizer Ortho Device/Splint Location: RLE Ortho Device/Splint Interventions: Ordered, Application   Post Interventions Patient Tolerated: Well Instructions Provided: Adjustment of device  [name] Norma Coleman 08/12/2024, 7:20 PM

## 2024-08-12 NOTE — Anesthesia Postprocedure Evaluation (Signed)
 Anesthesia Post Note  Patient: Norma Coleman  Procedure(s) Performed: RADIOLOGY WITH ANESTHESIA IR PERCUTANEOUS ART THROMBECTOMY/INFUSION INTRACRANIAL INC DIAG ANGIO IR US  GUIDE VASC ACCESS RIGHT IR CT HEAD LTD     Patient location during evaluation: PACU Anesthesia Type: General Level of consciousness: sedated and patient cooperative Pain management: pain level controlled Vital Signs Assessment: post-procedure vital signs reviewed and stable Respiratory status: spontaneous breathing, nonlabored ventilation, respiratory function stable and patient connected to nasal cannula oxygen Cardiovascular status: blood pressure returned to baseline and stable Postop Assessment: no apparent nausea or vomiting Anesthetic complications: no   There were no known notable events for this encounter.  Last Vitals:  Vitals:   08/12/24 1240 08/12/24 1250  BP:  (!) 167/68  Pulse:  84  Resp:  18  Temp: 36.7 C   SpO2:  98%    Last Pain:  Vitals:   08/12/24 1352  PainSc: 5                  Avery Klingbeil,E. Norinne Jeane

## 2024-08-12 NOTE — ED Provider Notes (Signed)
 [place] EMERGENCY DEPARTMENT AT [facility] Provider Note   CSN: [id number] Arrival date & time: 08/12/24  1227     History  Chief Complaint  Patient presents with  . Code Stroke    Norma Coleman is a 89 y.o. female with PMH as listed below who presents BIBEMS as code stroke, LKN 8:30 pm last night, CBG 103 mg/dL on arrival. EMS reports patient had acute onset left-sided weakness, left hemianopsia and dysarthria. She was in her usual state of health when she went to bed last night at 8 PM, but called her daughter this morning to state that she had fallen out of bed and struck her head on the nightstand. Her daughter noted that she had left-sided weakness and dysarthria when she came to check on her and so called EMS. She has ESRD on HD and missed dialysis yesterday d/t not feeling well.    Past Medical History:  Diagnosis Date  . DVT (deep venous thrombosis) (HCC)   . GI bleed due to NSAIDs   . High cholesterol   . Hypertension   . Hyperthyroidism   . Pneumonia   . Skin cancer   . Thyroid  disease        Home Medications Prior to Admission medications   Medication Sig Start Date End Date Taking? Authorizing Provider  ALPRAZolam  (XANAX ) 1 MG tablet Take 1 mg by mouth at bedtime as needed for anxiety.    [provider]  amLODipine  (NORVASC ) 10 MG tablet Take 10 mg by mouth daily. 06/30/22   [provider]  ascorbic acid (VITAMIN C) 500 MG tablet Take 500 mg by mouth daily.    [provider]  atorvastatin  (LIPITOR) 40 MG tablet Take 40 mg by mouth daily. 01/09/20   [provider]  benzonatate  (TESSALON ) 100 MG capsule Take 1 capsule (100 mg total) by mouth 3 (three) times daily as needed for cough. 09/20/22   Nooruddin, Saad, MD  Biotin 5 MG CAPS Take 5 mg by mouth daily.    [provider]  carvedilol  (COREG ) 3.125 MG tablet Take 1 tablet by mouth 2 (two) times daily with a meal. 06/30/22   [provider]   cholecalciferol (VITAMIN D3) 25 MCG (1000 UNIT) tablet Take 1,000 Units by mouth daily.    [provider]  DM-Doxylamine-Acetaminophen  (NYQUIL COLD & FLU PO) Take 5 mLs by mouth daily as needed (cough).    [provider]  hydrALAZINE  (APRESOLINE ) 50 MG tablet Take 50 mg by mouth 3 (three) times daily. 04/28/22   [provider]  levothyroxine  (SYNTHROID ) 75 MCG tablet Take 75 mcg by mouth daily before breakfast. 06/30/22   [provider]  Multiple Vitamins-Minerals (MULTIVITAMIN WITH MINERALS) tablet Take 1 tablet by mouth daily.    [provider]  oxyCODONE (OXY IR/ROXICODONE) 5 MG immediate release tablet Take 5 mg by mouth daily as needed for moderate pain or severe pain. 05/11/22   [provider]  sodium bicarbonate  650 MG tablet Take 650 mg by mouth 2 (two) times daily. 07/11/21   [provider]  vitamin B-12 (CYANOCOBALAMIN ) 100 MCG tablet Take 100 mcg by mouth daily.    [provider]      Allergies    Ibuprofen and Sulfa antibiotics    Review of Systems   Review of Systems A 10 point review of systems was performed and is negative unless otherwise reported in HPI.  Physical Exam Updated Vital Signs BP (!) 154/60 (  BP Location: Left Leg)   Pulse 99   Temp 99.2 F (37.3 C) (Oral)   Resp 19   Ht 5' 3 (1.6 m)   Wt 64.3 kg   SpO2 97%   BMI 25.11 kg/m  Physical Exam General: Normal appearing elderly female, lying in bed.  HEENT: NCAT, PERRLA, Sclera anicteric, MMM, trachea midline.  Cardiology: RRR, no murmurs/rubs/gallops.  Resp: Normal respiratory rate and effort. CTAB, no wheezes, rhonchi, crackles.  Abd: Soft, non-tender, non-distended. No rebound tenderness or guarding.  GU: Deferred. MSK: No peripheral edema or signs of trauma. Extremities without deformity or TTP.  Skin: warm, dry.  Back: No midline C spine TTP Neuro: A&Ox4. L hemianopsia, EOMI, left facial droop, mild dysarthria, drip  present in L upper and lower extremities.  Psych: Normal mood and affect.   1a  Level of consciousness: 0=alert; keenly responsive  1b. LOC questions:  0=Performs both tasks correctly  1c. LOC commands: 0=Performs both tasks correctly  2.  Best Gaze: 0=normal  3.  Visual: 2=Complete hemianopia  4. Facial Palsy: 1=Minor paralysis (flattened nasolabial fold, asymmetric on smiling)  5a.  Motor left arm: 1=Drift, limb holds 90 (or 45) degrees but drifts down before full 10 seconds: does not hit bed  5b.  Motor right arm: 0=No drift, limb holds 90 (or 45) degrees for full 10 seconds  6a. motor left leg: 1=Drift, limb holds 90 (or 45) degrees but drifts down before full 10 seconds: does not hit bed  6b  Motor right leg:  0=No drift, limb holds 90 (or 45) degrees for full 10 seconds  7. Limb Ataxia: 0=Absent  8.  Sensory: 0=Normal; no sensory loss  9. Best Language:  1=Mild to moderate aphasia; some obvious loss of fluency or facility of comprehension without significant limitation on ideas expressed or form of expression.  10. Dysarthria: 1=Mild to moderate, patient slurs at least some words and at worst, can be understood with some difficulty  11. Extinction and Inattention: 0=No abnormality   Total:   7        ED Results / Procedures / Treatments   Labs (all labs ordered are listed, but only abnormal results are displayed) Labs Reviewed  I-STAT CHEM 8, ED - Abnormal; Notable for the following components:      Result Value   BUN 70 (*)    Creatinine, Ser 11.10 (*)    Glucose, Bld 101 (*)    Hemoglobin 11.6 (*)    HCT 34.0 (*)    All other components within normal limits  CBG MONITORING, ED - Abnormal; Notable for the following components:   Glucose-Capillary 103 (*)    All other components within normal limits  PROTIME-INR  APTT  CBC  DIFFERENTIAL  COMPREHENSIVE METABOLIC PANEL WITH GFR  ETHANOL    EKG None  Radiology CXR: 1. Minimally displaced right 6th lateral rib  fracture, most likely old. 2. Cardiomegaly with improvement.  CTH: 1. Acute right MCA infarct involving the frontoparietal operculum. No hemorrhage. ASPECTS of 9. 2. Severe chronic small vessel ischemic disease. 3. 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 4. Emergent results were discussed by telephone with Dr. CANDIE Mari at 12:55 PM on 08/12/2024.  CT C-spine: 1. No acute osseous abnormality. 2. Multilevel disc and facet degeneration without evidence of high-grade stenosis.  CTA H&N: 1. Occlusion of the right M2 inferior division at its origin. 2. Right MCA penumbra, with the volume being slightly overestimated as the  small core infarct visible on CT was not detected by perfusion processing. 3. Mild atherosclerosis elsewhere in the head and neck without a significant stenosis.  Procedures .Critical Care  Performed by: Franklyn Sid SAILOR, MD Authorized by: Franklyn Sid SAILOR, MD   Critical care provider statement:    Critical care time (minutes):  30   Critical care was necessary to treat or prevent imminent or life-threatening deterioration of the following conditions:  CNS failure or compromise   Critical care was time spent personally by me on the following activities:  Development of treatment plan with patient or surrogate, discussions with consultants, evaluation of patient's response to treatment, examination of patient, ordering and review of laboratory studies, ordering and review of radiographic studies, ordering and performing treatments and interventions, pulse oximetry, re-evaluation of patient's condition, review of old charts and obtaining history from patient or surrogate   Care discussed with: admitting provider       Medications Ordered in ED Medications  sodium chloride  flush (NS) 0.9 % injection 3 mL (3 mLs Intravenous Not Given 08/12/24 2023)  iohexol  (OMNIPAQUE ) 350 MG/ML injection 100 mL (100 mLs Intravenous  Contrast Given 08/12/24 1251)  iohexol  (OMNIPAQUE ) 300 MG/ML solution 100 mL (70 mLs Intra-arterial Contrast Given 08/12/24 1534)    ED Course/ Medical Decision Making/ A&P                          Medical Decision Making Amount and/or Complexity of Data Reviewed Labs: ordered. Decision-making details documented in ED Course. Radiology: ordered. Decision-making details documented in ED Course.  Risk Decision regarding hospitalization.    This patient presents to the ED for concern of stroke-like sxs, this involves an extensive number of treatment options, and is a complaint that carries with it a high risk of complications and morbidity.  I considered the following differential and admission for this acute, potentially life threatening condition.   MDM:    Given the acute onset of neurological symptoms, stroke is the most concerning etiology of these acute symptoms. The neuro exam is significant for L sided weakness, facial droop, L hemianopsia.   Neurology team evaluating patient at bedside. Plan to obtain emergent CT brain.  LKN: 8:30 PM last night Glucose: 103 mg/dL BP: 839/29  Outside of window of TNK. However, CTA H&N shows R MCA occlusion. She is within window for mechanical thrombectomy. Daughter consented for IR mechanical thrombectomy. Daughter would like for her to be full code.   Clinical Course as of 08/12/24 1312  Tue Aug 12, 2024  1304 Creatinine(!): 11.10 ESRD on HD [HN]  1304 WBC: 9.8 No leukocytosis  [HN]  1304 Hemoglobin(!): 10.5 H/o anemia/ESRD [HN]  1310 Pt w/ R M1 occlusion on CTA. Neurology talking with IR. Patient will be going immediately to IR. [HN]  1311 CT HEAD CODE STROKE WO CONTRAST 1. Acute right MCA infarct involving the frontoparietal operculum. No hemorrhage. ASPECTS of 9. 2. Severe chronic small vessel ischemic disease. 3. 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 4. Emergent results were  discussed by telephone with Dr. CANDIE Mari at 12:55 PM on 08/12/2024.   [HN]    Clinical Course User Index [HN] Franklyn Sid SAILOR, MD    Labs: I Ordered, and personally interpreted labs.  The pertinent results include:  those listed above  Imaging Studies ordered: I ordered imaging studies including CTH, CTA H&N, CT C-spine I independently visualized and interpreted imaging. I agree  with the radiologist interpretation  Additional history obtained from chart review, EMS.   Social Determinants of Health: .Lives at SNF  Disposition:  to IR   Co morbidities that complicate the patient evaluation . Past Medical History:  Diagnosis Date  . DVT (deep venous thrombosis) (HCC)   . GI bleed due to NSAIDs   . High cholesterol   . Hypertension   . Hyperthyroidism   . Pneumonia   . Skin cancer   . Thyroid  disease      Medicines Meds ordered this encounter  Medications  . sodium chloride  flush (NS) 0.9 % injection 3 mL    I have reviewed the patients home medicines and have made adjustments as needed  Problem List / ED Course: Problem List Items Addressed This Visit   None Visit Diagnoses       Acute cerebrovascular accident (CVA) due to occlusion of right middle cerebral artery (HCC)    -  Primary   Relevant Medications   heparin  injection 5,000 Units   atorvastatin  (LIPITOR) tablet 40 mg   clevidipine  (CLEVIPREX ) infusion 0.5 mg/mL   aspirin  EC tablet 81 mg   aspirin  suppository 300 mg                    This note was created using dictation software, which may contain spelling or grammatical errors.    Franklyn Sid SAILOR, MD 08/15/24 2216

## 2024-08-12 NOTE — Progress Notes (Signed)
 Called to see the patient for worsened LUE weakness. STAT CT ordered.   Exam in CT reveals new LUE drift. NIHSS performed together with RN: Some differences between current NIHSS and prior are most likely due to interrater variability based on RN's description of her prior NIHSS which was performed together with the patient's RN from the previous shift.   NIHSS components Score: Comment  1a Level of Conscious 0[x]  1[]  2[]  3[]      1b LOC Questions 0[x]  1[]  2[]       1c LOC Commands 0[x]  1[]  2[]       2 Best Gaze 0[x]  1[]  2[]       3 Visual 0[x]  1[]  2[]  3[]      4 Facial Palsy 0[]  1[]  2[x]  3[]      5a Motor Arm - left 0[]  1[x]  2[]  3[]  4[]  UN[]    5b Motor Arm - Right 0[x]  1[]  2[]  3[]  4[]  UN[]    6a Motor Leg - Left 0[]  1[]  2[x]  3[]  4[]  UN[]   In the context of fatigue and pain.   6b Motor Leg - Right 0[]  1[]  2[x]  3[]  4[]  UN[]   In the context of fatigue, left leg brace post-thrombectomy, and pain.   7 Limb Ataxia 0[x]  1[]  2[]  UN[]      8 Sensory 0[]  1[x]  2[]  UN[]     Mild to moderately decreased to left arm and leg. Face sensation subjectively normal and equal bilaterally.   9 Best Language 0[x]  1[]  2[]  3[]      10 Dysarthria 0[]  1[x]  2[]  UN[]     Mild, in the context of diffuse fatigue.   11 Extinct. and Inattention 0[]  1[x]  2[]      Left arm and leg to DSS.   TOTAL:   10    STAT CT:  Mild hyperdensity over the right MCA territory, likely contrast staining following neurointerventional procedure.  A/P: 89 y.o. female presenting with cerebral infarction due to thrombosis of right middle cerebral artery. She is status-post mechanical thrombectomy with TICI 3 revascularization. Neurology called to re-evaluate due to new LUE drift.  - Exam findings are not consistent with reocclusion - Follow up CT head with contrast staining.  - Only definite worsening on repeat exam is her LUE mild drift. No acute intervention indicated.  - Continue with other recommendations as per Neurology and Neurointerventional  Radiology notes from earlier today.   25 minutes of neurocritical care time for this evaluation.    Electronically signed: Dr. Taleisha Kaczynski

## 2024-08-12 NOTE — Anesthesia Preprocedure Evaluation (Signed)
 Anesthesia Evaluation  Patient identified by MRN, date of birth, ID band Patient awake    Reviewed: Allergy & Precautions, NPO status , Patient's Chart, lab work & pertinent test results  History of Anesthesia Complications Negative for: history of anesthetic complications  Airway Mallampati: II  TM Distance: >3 FB Neck ROM: Full    Dental  (+) Dental Advisory Given   Pulmonary neg pulmonary ROS   breath sounds clear to auscultation       Cardiovascular hypertension, Pt. on medications and Pt. on home beta blockers + DVT   Rhythm:Regular Rate:Normal  '23 ECHO:  There is moderate concentric left ventricular hypertrophy.  Overall left ventricular systolic function is normal with ejection fraction approximately 60-65%.  The left atrium is moderately dilated by volume index.  There is mild mitral valve regurgitation.  There is mild tricuspid valve regurgitation.     Neuro/Psych CVA (acute)    GI/Hepatic negative GI ROS, Neg liver ROS,,,  Endo/Other  diabetes (glu 103)Hypothyroidism    Renal/GU Dialysis and ESRFRenal disease (missed dialysis yesterday, K+ 4.8)     Musculoskeletal   Abdominal   Peds  Hematology Hb 11.6, plt 213k   Anesthesia Other Findings   Reproductive/Obstetrics                              Anesthesia Physical Anesthesia Plan  ASA: 3 and emergent  Anesthesia Plan: General   Post-op Pain Management: Minimal or no pain anticipated   Induction: Intravenous  PONV Risk Score and Plan: 3 and Ondansetron , Dexamethasone and Treatment may vary due to age or medical condition  Airway Management Planned: Oral ETT  Additional Equipment: None  Intra-op Plan:   Post-operative Plan: Extubation in OR  Informed Consent: I have reviewed the patients History and Physical, chart, labs and discussed the procedure including the risks, benefits and alternatives for the proposed  anesthesia with the patient or authorized representative who has indicated his/her understanding and acceptance.     Dental advisory given  Plan Discussed with: CRNA and Surgeon  Anesthesia Plan Comments:          Anesthesia Quick Evaluation

## 2024-08-12 NOTE — Code Documentation (Signed)
 Stroke Response Nurse Documentation Code Documentation  Norma Coleman is a 89 y.o. female arriving to [facility]  via [facility] on 08/12/2024 with past medical hx of ESRD on Dialysis, DVT, HLD, HTN. Code stroke was activated by EMS.   Patient from home where she was LKW at 2200 last night and now complaining of left sided weakness and slurred speech by family. Pt lives with daughter and son-in-law. Pt was at her baseline when she went to bed last night. Called daughter at 34 this morning reporting being down on the ground all night. Daughter found patient down and helped her up to the bathroom. While going to the bathroom, the daughter noted left sided weakness and called EMS.   Stroke team at the bedside on patient arrival. Labs drawn and patient cleared for CT by Dr. Naazs. Patient to CT with team. NIHSS 7, see documentation for details and code stroke times. Patient with left hemianopia, left facial droop, left arm weakness, left leg weakness, Expressive aphasia , and dysarthria  on exam. The following imaging was completed:  CT Head, CTA, and CTP. Patient is not a candidate for IV Thrombolytic due to being outside window. Patient is a candidate for IR due to LVO noted on imaging per MD.   Care Plan: Pt to IR.   Bedside handoff with IR RN Katie.    Norma Coleman  Stroke Response RN

## 2024-08-12 NOTE — Progress Notes (Signed)
 Notified NeuroSx Dr. Maude that Pt femoral site was bruised, Provider wants this RN to cont. To monitor and notify if hematoma becomes present.

## 2024-08-13 ENCOUNTER — Encounter (HOSPITAL_COMMUNITY): Payer: Self-pay | Admitting: Radiology

## 2024-08-13 ENCOUNTER — Inpatient Hospital Stay (HOSPITAL_COMMUNITY)

## 2024-08-13 DIAGNOSIS — G936 Cerebral edema: Secondary | ICD-10-CM | POA: Diagnosis not present

## 2024-08-13 DIAGNOSIS — R930 Abnormal findings on diagnostic imaging of skull and head, not elsewhere classified: Secondary | ICD-10-CM | POA: Diagnosis not present

## 2024-08-13 DIAGNOSIS — G9389 Other specified disorders of brain: Secondary | ICD-10-CM | POA: Diagnosis not present

## 2024-08-13 DIAGNOSIS — R569 Unspecified convulsions: Secondary | ICD-10-CM | POA: Diagnosis not present

## 2024-08-13 DIAGNOSIS — I6782 Cerebral ischemia: Secondary | ICD-10-CM | POA: Diagnosis not present

## 2024-08-13 DIAGNOSIS — I639 Cerebral infarction, unspecified: Secondary | ICD-10-CM | POA: Diagnosis not present

## 2024-08-13 DIAGNOSIS — I6601 Occlusion and stenosis of right middle cerebral artery: Secondary | ICD-10-CM | POA: Diagnosis not present

## 2024-08-13 DIAGNOSIS — I6523 Occlusion and stenosis of bilateral carotid arteries: Secondary | ICD-10-CM | POA: Diagnosis not present

## 2024-08-13 LAB — COMPREHENSIVE METABOLIC PANEL WITH GFR
ALT: 8 U/L (ref 0–44)
AST: 22 U/L (ref 15–41)
Albumin: 2.9 g/dL — ABNORMAL LOW (ref 3.5–5.0)
Alkaline Phosphatase: 275 U/L — ABNORMAL HIGH (ref 38–126)
Anion gap: 20 — ABNORMAL HIGH (ref 5–15)
BUN: 71 mg/dL — ABNORMAL HIGH (ref 8–23)
CO2: 18 mmol/L — ABNORMAL LOW (ref 22–32)
Calcium: 9.3 mg/dL (ref 8.9–10.3)
Chloride: 102 mmol/L (ref 98–111)
Creatinine, Ser: 10.97 mg/dL — ABNORMAL HIGH (ref 0.44–1.00)
GFR, Estimated: 3 mL/min — ABNORMAL LOW (ref >60)
Glucose, Bld: 129 mg/dL — ABNORMAL HIGH (ref 70–99)
Potassium: 5.5 mmol/L — ABNORMAL HIGH (ref 3.5–5.1)
Sodium: 140 mmol/L (ref 135–145)
Total Bilirubin: 0.7 mg/dL (ref 0.0–1.2)
Total Protein: 5.4 g/dL — ABNORMAL LOW (ref 6.5–8.1)

## 2024-08-13 LAB — GLUCOSE, CAPILLARY
Glucose-Capillary: 118 mg/dL — ABNORMAL HIGH (ref 70–99)
Glucose-Capillary: 77 mg/dL (ref 70–99)
Glucose-Capillary: 81 mg/dL (ref 70–99)
Glucose-Capillary: 94 mg/dL (ref 70–99)
Glucose-Capillary: 95 mg/dL (ref 70–99)

## 2024-08-13 LAB — ECHOCARDIOGRAM COMPLETE
AR max vel: 1.7 cm2
AV Area VTI: 1.73 cm2
AV Area mean vel: 1.76 cm2
AV Mean grad: 8.5 mmHg
AV Peak grad: 17.1 mmHg
Ao pk vel: 2.07 m/s
Area-P 1/2: 3.16 cm2
Height: 63 in
S' Lateral: 3.5 cm
Weight: 2116.42 [oz_av]

## 2024-08-13 LAB — MAGNESIUM: Magnesium: 1.9 mg/dL (ref 1.7–2.4)

## 2024-08-13 LAB — CBC
HCT: 26.8 % — ABNORMAL LOW (ref 36.0–46.0)
Hemoglobin: 8.4 g/dL — ABNORMAL LOW (ref 12.0–15.0)
MCH: 31.5 pg (ref 26.0–34.0)
MCHC: 31.3 g/dL (ref 30.0–36.0)
MCV: 100.4 fL — ABNORMAL HIGH (ref 80.0–100.0)
Platelets: 202 K/uL (ref 150–400)
RBC: 2.67 MIL/uL — ABNORMAL LOW (ref 3.87–5.11)
RDW: 14.6 % (ref 11.5–15.5)
WBC: 8.7 K/uL (ref 4.0–10.5)
nRBC: 0 % (ref 0.0–0.2)

## 2024-08-13 LAB — URINALYSIS, ROUTINE W REFLEX MICROSCOPIC
Bilirubin Urine: NEGATIVE
Glucose, UA: NEGATIVE mg/dL
Ketones, ur: NEGATIVE mg/dL
Nitrite: NEGATIVE
Protein, ur: 30 mg/dL — AB
Specific Gravity, Urine: 1.015 (ref 1.005–1.030)
pH: 6 (ref 5.0–8.0)

## 2024-08-13 LAB — HEMOGLOBIN A1C
Hgb A1c MFr Bld: 5.2 % (ref 4.8–5.6)
Mean Plasma Glucose: 103 mg/dL

## 2024-08-13 LAB — LIPID PANEL
Cholesterol: 108 mg/dL (ref 0–200)
HDL: 47 mg/dL (ref >40)
LDL Cholesterol: 35 mg/dL (ref 0–99)
Total CHOL/HDL Ratio: 2.3 ratio
Triglycerides: 128 mg/dL (ref <150)
VLDL: 26 mg/dL (ref 0–40)

## 2024-08-13 LAB — HEPATITIS B SURFACE ANTIGEN: Hepatitis B Surface Ag: NONREACTIVE

## 2024-08-13 MED ORDER — VALPROATE SODIUM 100 MG/ML IV SOLN
1200.0000 mg | Freq: Once | INTRAVENOUS | Status: AC
  Administered 2024-08-13: 1200 mg via INTRAVENOUS
  Filled 2024-08-13: qty 12

## 2024-08-13 MED ORDER — VALPROATE SODIUM 100 MG/ML IV SOLN
15.0000 mg/kg/d | Freq: Three times a day (TID) | INTRAVENOUS | Status: DC
  Administered 2024-08-13 – 2024-08-14 (×5): 300 mg via INTRAVENOUS
  Filled 2024-08-13 (×7): qty 3

## 2024-08-13 MED ORDER — LORAZEPAM 2 MG/ML IJ SOLN
INTRAMUSCULAR | Status: AC
  Administered 2024-08-13: 2 mg
  Filled 2024-08-13: qty 1

## 2024-08-13 MED ORDER — SODIUM CHLORIDE 0.9 % IV BOLUS
500.0000 mL | Freq: Once | INTRAVENOUS | Status: AC
  Administered 2024-08-13: 500 mL via INTRAVENOUS

## 2024-08-13 MED ORDER — ASPIRIN 81 MG PO TBEC
81.0000 mg | DELAYED_RELEASE_TABLET | Freq: Every day | ORAL | Status: DC
  Administered 2024-08-15 – 2024-08-20 (×6): 81 mg via ORAL
  Filled 2024-08-13 (×6): qty 1

## 2024-08-13 MED ORDER — DOXERCALCIFEROL 4 MCG/2ML IV SOLN
6.0000 ug | INTRAVENOUS | Status: DC
  Administered 2024-08-13 – 2024-08-20 (×3): 6 ug via INTRAVENOUS
  Filled 2024-08-13 (×3): qty 4

## 2024-08-13 MED ORDER — ASPIRIN 300 MG RE SUPP
300.0000 mg | Freq: Every day | RECTAL | Status: DC
  Administered 2024-08-13 – 2024-08-14 (×2): 300 mg via RECTAL
  Filled 2024-08-13 (×5): qty 1

## 2024-08-13 MED ORDER — LORAZEPAM 2 MG/ML IJ SOLN: 2.0000 mg | Freq: Once | INTRAMUSCULAR | Status: AC

## 2024-08-13 MED ORDER — DOXERCALCIFEROL 4 MCG/2ML IV SOLN
INTRAVENOUS | Status: AC
  Filled 2024-08-13: qty 4

## 2024-08-13 MED ORDER — LORAZEPAM 2 MG/ML IJ SOLN
INTRAMUSCULAR | Status: DC
  Filled 2024-08-13: qty 1

## 2024-08-13 MED ORDER — CHLORHEXIDINE GLUCONATE CLOTH 2 % EX PADS
6.0000 | MEDICATED_PAD | Freq: Every day | CUTANEOUS | Status: DC
  Administered 2024-08-13 – 2024-08-19 (×8): 6 via TOPICAL

## 2024-08-13 NOTE — Progress Notes (Signed)
 Brief neuro Note:  I was called to evaluate Ms. Norma Coleman for obtunded mentation during dialysis. She is unarousable. She has intact brainstem reflexes with intact pupillary responses, intact corneals, intact gag. She has slight grimace to noxious. No response to proximal pinch in any of the extremities.  STAT CT head with loss of grey and white differentiation in R MCA and partially R ACA territory. Her exam at this time is likely a combination of stroke and ativan earlier.  I discussed CT findings with daughter over phone and with clear loss of sulci and gyri in R MCA distribution, suspect a large R MCA stroke. I recommended that we consider comfort care but offered the option to get MRI Brain and MRA head to clarify the extent of stroke to help her make a decision.  Daughter reports that she is on her way.  I attempted to call her again to clarify code status but was unable to reach her.  Plan discussed with Dr. Rosemarie with the stroke team.  Verneal Wiers [facility]

## 2024-08-13 NOTE — Plan of Care (Signed)
 MRI brain:  1. Intermediate-sized acute/early subacute infarct of the right frontal operculum and insula. 2. Scattered punctate foci of acute ischemia in the left parietal lobe and right frontal white matter. 3. Early confluent hyperintense T2-weighted signal within the cerebral white matter, most commonly due to chronic small vessel disease.  MRA head:  ANTERIOR CIRCULATION: No significant stenosis of the internal carotid arteries. No significant stenosis of the anterior cerebral arteries. No significant stenosis of the middle cerebral arteries. No aneurysm.  POSTERIOR CIRCULATION: No significant stenosis of the posterior cerebral arteries. No significant stenosis of the basilar artery. No significant stenosis of the vertebral arteries. No aneurysm.  Discussed the patient's MRI result and code status with the patient's daughter, Burnard. She wishes patient to be designated DNR/DNI. She wishes to continue with full scope of medical care.   Electronically signed: Dr. Dillon Mcreynolds

## 2024-08-13 NOTE — Progress Notes (Signed)
 SLP Cancellation Note  Patient Details Name: STEFANIA GOULART MRN: [id number] DOB: 12-26-1932   Cancelled treatment:       Reason Eval/Treat Not Completed: Fatigue/lethargy limiting ability to participate (Patient received Ativan s/p seizures early this am. Per RN, too lethargic currently for po trials or cognitive-linguistic evaluation. Plan to f/u next date.)  Rea Pass MA, CCC-SLP  Kiasia Chou Meryl 08/13/2024, 11:12 AM

## 2024-08-13 NOTE — Progress Notes (Signed)
 PT Cancellation Note  Patient Details Name: Norma Coleman MRN: [id number] DOB: 1933/04/24   Cancelled Treatment:    Reason Eval/Treat Not Completed: Fatigue/lethargy limiting ability to participate. RN reporting pt has been very lethargic since receiving ativan this AM and has barely briefly woken to RN's attempts to arouse her. Pt also planned to have HD later today with RN requesting therapy hold today and re-attempt tomorrow as able.   Norma Coleman, PT, DPT Acute Rehabilitation Services  Office: [phone]    Norma Coleman 08/13/2024, 12:18 PM

## 2024-08-13 NOTE — Progress Notes (Signed)
 Called to the bedside for new onset of focal seizure activity with rightward gaze deviation, facial twitching and intermittent subtle RUE twitching, in conjunction with verbal unresponsiveness.   Exam: Findings as documented above. Will withdraw to noxious with RLE briskly, weakly with LLE, localizes to sternal rub with RUE, no movement of LUE. Pupils equal and reactive.   A/R: New onset of focal seizure activity following right MCA thrombectomy. Of note, the seizure activity localizes to the LEFT hemisphere and her stroke involves her RIGHT hemisphere.  - Eye deviation improved after 2 mg IV Ativan, but still with subtle intermittent RUE twitching.  - After a second dose of IV Ativan, no longer with RUE twitching.  - Valproic acid 1200 mg IV load administered. After part of the IV dose was infusing, eyes are now closed and she is snoring consistent with normal breathing during sleep.  - Valproic acid 5 mg/kg q8h has been ordered for maintenance dosing.  - STAT CT head: Preliminary review reveals contrast staining similar to prior. No definite hemorrhage. Awaiting official read.   25 minutes of critical care time.   Electronically signed: Dr. Reshaun Briseno

## 2024-08-13 NOTE — Progress Notes (Signed)
 NIR Progress Note:   POD#1 s/p R M1 mechanical thrombectomy with TICI 3 recanalization. Overnight events include development of 2 seizure episodes.   Assessment and Plan: Acute stroke: TICI 3 recanlization. Follow up MRI is pending. Recommend best medical therapy and risk factor optimization.  Please call with questions, concerns, or change in patient condition. Anticipate difficult postop course secondary to age and medical co-morbidities.  This is a late entry for an encounter that occurred around 0815   Overnight Events: Seizure activity noted overnight which was treated medically.  Postop NIH was 11, but has decreased into the 20s.  She is maintaining her airway, but somnolent.   Subjective: Unable to obtain.    Objective: Physical Exam: BP (!) 159/97   Pulse (!) 101   Temp (!) 100.6 F (38.1 C) (Axillary)   Resp (!) 24   Ht 5' 4 (1.626 m)   Wt 43.7 kg   SpO2 93%   BMI 16.54 kg/m    Limited exam Left facial droop Mild bruising at femoral access site Right radial site is clean Distal extremity warm and well perfused    Current Medications    Current Facility-Administered Medications:     stroke: early stages of recovery book, , Does not apply, Once, Michaela Aisha SQUIBB, MD   0.9 %  sodium chloride  infusion, , Intravenous, Continuous, Michaela Aisha SQUIBB, MD, Last Rate: 40 mL/hr at 07/27/24 0800, Infusion Verify at 07/27/24 0800   acetaminophen  (TYLENOL ) tablet 650 mg, 650 mg, Oral, Q4H PRN **OR** acetaminophen  (TYLENOL ) 160 MG/5ML solution 650 mg, 650 mg, Per Tube, Q4H PRN **OR** acetaminophen  (TYLENOL ) suppository 650 mg, 650 mg, Rectal, Q4H PRN, Michaela Aisha SQUIBB, MD   clevidipine (CLEVIPREX) infusion 0.5 mg/mL, 0-21 mg/hr, Intravenous, Continuous, Ray Coy, MD   norepinephrine (LEVOPHED) 4-5 MG/250ML-% infusion SOLN, , , ,    Oral care mouth rinse, 15 mL, Mouth Rinse, PRN, Michaela Aisha SQUIBB, MD   senna-docusate (Senokot-S) tablet 1 tablet, 1  tablet, Oral, QHS PRN, Michaela Aisha SQUIBB, MD   sodium chloride  0.9 % bolus 250 mL, 250 mL, Intravenous, PRN, Ray Coy, MD   sodium chloride  flush (NS) 0.9 % injection 3 mL, 3 mL, Intravenous, Once, Doretha Folks, MD     Labs: CBC Recent Labs (last 2 labs)       Recent Labs    07/26/24 1540 07/26/24 1606 07/27/24 0503  WBC 6.4  --  13.8*  HGB 11.6* 11.9* 13.9  HCT 35.4* 35.0* 40.5  PLT 289  --  373      BMET Recent Labs (last 2 labs)       Recent Labs    07/26/24 1540 07/26/24 1606 07/27/24 0503  NA 128* 128* 130*  K 4.1 4.2 4.3  CL 96* 95* 91*  CO2 22  --  22  GLUCOSE 143* 139* 154*  BUN 18 20 15   CREATININE 0.96 1.00 0.85  CALCIUM  8.4*  --  9.1      LFT Recent Labs (last 2 labs)     Recent Labs    07/27/24 0503  PROT 6.5  ALBUMIN 3.8  AST 23  ALT 13  ALKPHOS 103  BILITOT 1.0      PT/INR Recent Labs (last 2 labs)     Recent Labs    07/26/24 1540  LABPROT 13.9  INR 1.0       LOS: 1 day    I spent a total of 15 minutes in face to face in clinical consultation, greater  than 50% of which was counseling/coordinating care.   RAY COY 07/27/2024 8:51 AM

## 2024-08-13 NOTE — Progress Notes (Addendum)
 STROKE TEAM PROGRESS NOTE    SIGNIFICANT HOSPITAL EVENTS 12/2 presented with acute left side weakness. NO TNK due to outside window, CTA with R MCA occlusion, taken for mechanical thrombectomy   INTERIM HISTORY/SUBJECTIVE Overnight events reviewed with right gaze and facial twitching and unresponsiveness. Was given 4 mg ativan and loaded with valproic acid and LTM ordered  MRI scan shows small right MCA infarct without any hemorrhage.  Right MCA flow-void appears patent 1 cm right cerebellopontine calcified meningioma.   CBC    Component Value Date/Time   WBC 8.7 08/13/2024 0247   RBC 2.67 (L) 08/13/2024 0247   HGB 8.4 (L) 08/13/2024 0247   HCT 26.8 (L) 08/13/2024 0247   PLT 202 08/13/2024 0247   MCV 100.4 (H) 08/13/2024 0247   MCH 31.5 08/13/2024 0247   MCHC 31.3 08/13/2024 0247   RDW 14.6 08/13/2024 0247   LYMPHSABS 0.8 08/12/2024 1233   MONOABS 0.5 08/12/2024 1233   EOSABS 0.0 08/12/2024 1233   BASOSABS 0.0 08/12/2024 1233    BMET    Component Value Date/Time   NA 140 08/13/2024 0247   K 5.5 (H) 08/13/2024 0247   CL 102 08/13/2024 0247   CO2 18 (L) 08/13/2024 0247   GLUCOSE 129 (H) 08/13/2024 0247   BUN 71 (H) 08/13/2024 0247   CREATININE 10.97 (H) 08/13/2024 0247   CALCIUM  9.3 08/13/2024 0247   GFRNONAA 3 (L) 08/13/2024 0247    IMAGING past 24 hours MR BRAIN WO CONTRAST Result Date: 08/13/2024 EXAM: MRI BRAIN WITHOUT CONTRAST 08/13/2024 09:26:18 AM TECHNIQUE: Multiplanar multisequence MRI of the head/brain was performed without the administration of intravenous contrast. COMPARISON: Comparison head CT 08/13/2024. CLINICAL HISTORY: Stroke, follow up. FINDINGS: The examination is mildly motion degraded. BRAIN AND VENTRICLES: There is a small acute right MCA infarct involving the frontoparietal operculum and insula with trace petechial hemorrhage. There is associated cytotoxic edema without mass effect. Confluent T2 hyperintensities elsewhere in the cerebral white matter  bilaterally are nonspecific but compatible with severe chronic small vessel ischemic disease. Mild chronic small vessel changes are present in the pons. A 1 cm partially calcified extra-axial mass is again noted in the right cerebellopontine angle with broad dural base along the tentorium. There is minimal mass effect on the right lateral pons without brain edema. There is mild cerebral atrophy. No midline shift, hydrocephalus, or extra axial fluid collection is evident. Major intracranial vascular flow voids are preserved. ORBITS: Bilateral cataract extraction. SINUSES AND MASTOIDS: No acute abnormality. BONES AND SOFT TISSUES: Normal marrow signal. No acute soft tissue abnormality. IMPRESSION: 1. Small acute right MCA infarct. 2. Severe chronic small vessel ischemic disease. 3. 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 Electronically signed by: Dasie Hamburg MD 08/13/2024 10:20 AM EST RP Workstation: [id number]   CT HEAD WO CONTRAST ( ) Result Date: 08/13/2024 EXAM: CT HEAD WITHOUT CONTRAST 08/13/2024 02:58:11 AM TECHNIQUE: CT of the head was performed without the administration of intravenous contrast. Automated exposure control, iterative reconstruction, and/or weight based adjustment of the mA/kV was utilized to reduce the radiation dose to as low as reasonably achievable. COMPARISON: Comparison with 08/12/2024. CLINICAL HISTORY: Seizure, new-onset, no history of trauma. FINDINGS: BRAIN AND VENTRICLES: No substantial change from prior. Mild hyperdensity over the right MCA territory compatible with contrast staining following neurointerventional procedure. Advanced chronic ischemic white matter change. Edema with loss of sulcation in the right cerebral hemisphere. No midline shift. Basal cisterns are patent. Unchanged 1 cm partially calcified extra-axial mass in the  right cerebellar pontine angle compatible with meningioma. No acute hemorrhage. No hydrocephalus. ORBITS: No acute  abnormality. SINUSES: No acute abnormality. SOFT TISSUES AND SKULL: No acute soft tissue abnormality. No skull fracture. IMPRESSION: 1. No change from 12 / 2 / 25 at 8:44 pm. 2. Mild hyperdensity over the right MCA territory compatible with contrast staining following neurointerventional procedure. 3. Edema with loss of sulcation in the right cerebral hemisphere. No midline shift. 4. Unchanged 1 cm partially calcified extra-axial mass in the right cerebellar pontine angle compatible with meningioma. Electronically signed by: Norman Gatlin MD 08/13/2024 03:07 AM EST RP Workstation: [id number]   CT HEAD WO CONTRAST ( ) Result Date: 08/12/2024 EXAM: CT HEAD WITHOUT CONTRAST 08/12/2024 08:47:00 PM TECHNIQUE: CT of the head was performed without the administration of intravenous contrast. Automated exposure control, iterative reconstruction, and/or weight based adjustment of the mA/kV was utilized to reduce the radiation dose to as low as reasonably achievable. COMPARISON: Head CT performed on 08/12/2024 at 12:37 pm. CLINICAL HISTORY: Neuro deficit, acute, stroke suspected; Worsened NIHSS. FINDINGS: BRAIN AND VENTRICLES: Mild hyperdensity over the right MCA territory likely contrast staining following neurointerventional procedure. There is advanced chronic ischemic white matter change. No acute hemorrhage. No evidence of acute infarct. No hydrocephalus. No extra-axial collection. No mass effect or midline shift. ORBITS: No acute abnormality. SINUSES: No acute abnormality. SOFT TISSUES AND SKULL: No acute soft tissue abnormality. No skull fracture. IMPRESSION: 1. Mild hyperdensity over the right MCA territory, likely contrast staining following neurointerventional procedure. 2. Advanced chronic ischemic white matter changes. Electronically signed by: Franky Stanford MD 08/12/2024 09:34 PM EST RP Workstation: [id number]   DG Chest Portable 1 View Result Date: 08/12/2024 CLINICAL DATA:  Clem[facility]. EXAM: PORTABLE CHEST 1  VIEW COMPARISON:  09/13/2022 FINDINGS: Increased patient rotation to the right. Enlarged cardiac silhouette with improvement. Clear lungs with normal vascularity. Stable minimal chronic interstitial prominence. Minimally displaced right 6th lateral rib fracture, most likely old. IMPRESSION: 1. Minimally displaced right 6th lateral rib fracture, most likely old. 2. Cardiomegaly with improvement. Electronically Signed   By: Elspeth Bathe M.D.   On: 08/12/2024 16:50   IR PERCUTANEOUS ART THROMBECTOMY/INFUSION INTRACRANIAL INC DIAG ANGIO Result Date: 08/12/2024 PROCEDURE PERFORMED: 1. Cerebral angiography with stroke thrombectomy 2. Ultrasound guided vascular access, right common femoral artery 3. Cone beam CT for treatment planning 4. Ultrasound-guided vascular access, right radial artery COMPARISON:  CT angiogram of the head and neck performed August 12, 2024 CLINICAL DATA:  89 year old female with acute ischemic stroke and NIH stroke scale measuring 8. Symptoms were driven by hemi anopsia. An appropriate informed consent was obtained and the patient was taken for endovascular therapy. INDICATION: Acute ischemic stroke. ANESTHESIA/SEDATION: General anesthesia was utilized for the procedure. CONTRAST:  Approximately 70 cc Ominipque 300 MEDICATIONS: See MAR FLUOROSCOPY TIME:  Fluoroscopy Time: 55 minutes 6 sec, (981 mGy). COMPLICATIONS: None immediate. BODY OF REPORT: Following a full explanation of the procedure along with the potential associated complications, an informed witnessed consent was obtained. The patient was then placed under general anesthesia by the Department of Anesthesiology at [facility]. The right femoral access site was prepped and draped in the usual sterile fashion. Ultrasound was used to study the right common femoral artery which was patent. Using real-time ultrasound guidance, a 21 gauge introducer needle was used to access the right common femoral artery. Access was performed at  1324. A hard copy image ultrasound the saved and stored in PACS. Using this access, a 6 French sheath was  placed in the descending thoracic aorta. Next, selective catheterization the innominate artery was performed and a selective angiogram was performed which demonstrated a severely tortuous arch configuration. Multiple catheters and guidewires were utilized in an exhaustive attempt to gain stable access to the right carotid territory which were unsuccessful. Exchange was made for an 8 French sheath in order to provide improved support. A repeat approach with multiple catheters did not demonstrate significant improvement. At this point, I dedicated my effort to obtaining right radial access in an effort to approach the thrombectomy procedure from a right radial access. Ultrasound was used to study the right radial artery which was patent. Using real-time ultrasound guidance, a 21 gauge needle was used to gain access to the right radial artery. A wire was placed. Next, a 7 French sheath was placed in the right radial artery. Next, a red 72 catheter was advanced into the right internal carotid artery. A selective angiogram was performed which demonstrated the right M1 occlusion. I was unable to advance the 72 catheter beyond the carotid siphon. Next, multiple microcatheter is were utilized in an effort to gain more distal access which were unsuccessful. This was primary secondary to arterial tortuosity and instability of the access system. At this point, Dr. Todd Burns joined me at my request. Exchange was made for a zoom 88 sheath system. After multiple attempts, he was able to catheterize the right internal carotid artery and advance this sheath system for more stable access. Next, a catalyst support catheter and Trevo Trak microcatheter were ultimately advanced into the right M2 segment. Next, a 4 mm solitaire was deployed. The first pass was performed at 1525. Recanalization was achieved at this time. This yielded  the occlusive thrombus and flow was restored to this segment. There was no evidence of active extravasation or complication. Post treatment TICI score 3. After reviewing the imaging, I elected to terminate the procedure at this point. Evaluation of the right femoral access site demonstrated that the site was suitable for a closure device. A 8 French Angio-Seal device was deployed without complication. A TR band was then deployed at the right radial access site. Cone beam CT was then performed to evaluate for intracranial hemorrhage and treatment planning. This demonstrated no evidence of significant acute intracranial hemorrhage. The patient was removed from anesthesia and transferred to recovery in stable condition. IMPRESSION: 1. Stent retriever assisted thrombectomy of right M1 occlusion with successful restoration of flow to the right MCA territory. PLAN: 1. To recovery for routine postoperative supportive care. Electronically Signed   By: Maude Naegeli M.D.   On: 08/12/2024 16:33   IR US  Guide Vasc Access Right Result Date: 08/12/2024 PROCEDURE PERFORMED: 1. Cerebral angiography with stroke thrombectomy 2. Ultrasound guided vascular access, right common femoral artery 3. Cone beam CT for treatment planning 4. Ultrasound-guided vascular access, right radial artery COMPARISON:  CT angiogram of the head and neck performed August 12, 2024 CLINICAL DATA:  89 year old female with acute ischemic stroke and NIH stroke scale measuring 8. Symptoms were driven by hemi anopsia. An appropriate informed consent was obtained and the patient was taken for endovascular therapy. INDICATION: Acute ischemic stroke. ANESTHESIA/SEDATION: General anesthesia was utilized for the procedure. CONTRAST:  Approximately 70 cc Ominipque 300 MEDICATIONS: See MAR FLUOROSCOPY TIME:  Fluoroscopy Time: 55 minutes 6 sec, (981 mGy). COMPLICATIONS: None immediate. BODY OF REPORT: Following a full explanation of the procedure along with the  potential associated complications, an informed witnessed consent was obtained. The patient was then  placed under general anesthesia by the Department of Anesthesiology at [facility]. The right femoral access site was prepped and draped in the usual sterile fashion. Ultrasound was used to study the right common femoral artery which was patent. Using real-time ultrasound guidance, a 21 gauge introducer needle was used to access the right common femoral artery. Access was performed at 1324. A hard copy image ultrasound the saved and stored in PACS. Using this access, a 6 French sheath was placed in the descending thoracic aorta. Next, selective catheterization the innominate artery was performed and a selective angiogram was performed which demonstrated a severely tortuous arch configuration. Multiple catheters and guidewires were utilized in an exhaustive attempt to gain stable access to the right carotid territory which were unsuccessful. Exchange was made for an 8 French sheath in order to provide improved support. A repeat approach with multiple catheters did not demonstrate significant improvement. At this point, I dedicated my effort to obtaining right radial access in an effort to approach the thrombectomy procedure from a right radial access. Ultrasound was used to study the right radial artery which was patent. Using real-time ultrasound guidance, a 21 gauge needle was used to gain access to the right radial artery. A wire was placed. Next, a 7 French sheath was placed in the right radial artery. Next, a red 72 catheter was advanced into the right internal carotid artery. A selective angiogram was performed which demonstrated the right M1 occlusion. I was unable to advance the 72 catheter beyond the carotid siphon. Next, multiple microcatheter is were utilized in an effort to gain more distal access which were unsuccessful. This was primary secondary to arterial tortuosity and instability of the  access system. At this point, Dr. Todd Burns joined me at my request. Exchange was made for a zoom 88 sheath system. After multiple attempts, he was able to catheterize the right internal carotid artery and advance this sheath system for more stable access. Next, a catalyst support catheter and Trevo Trak microcatheter were ultimately advanced into the right M2 segment. Next, a 4 mm solitaire was deployed. The first pass was performed at 1525. Recanalization was achieved at this time. This yielded the occlusive thrombus and flow was restored to this segment. There was no evidence of active extravasation or complication. Post treatment TICI score 3. After reviewing the imaging, I elected to terminate the procedure at this point. Evaluation of the right femoral access site demonstrated that the site was suitable for a closure device. A 8 French Angio-Seal device was deployed without complication. A TR band was then deployed at the right radial access site. Cone beam CT was then performed to evaluate for intracranial hemorrhage and treatment planning. This demonstrated no evidence of significant acute intracranial hemorrhage. The patient was removed from anesthesia and transferred to recovery in stable condition. IMPRESSION: 1. Stent retriever assisted thrombectomy of right M1 occlusion with successful restoration of flow to the right MCA territory. PLAN: 1. To recovery for routine postoperative supportive care. Electronically Signed   By: Maude Naegeli M.D.   On: 08/12/2024 16:33   IR CT Head Ltd Result Date: 08/12/2024 PROCEDURE PERFORMED: 1. Cerebral angiography with stroke thrombectomy 2. Ultrasound guided vascular access, right common femoral artery 3. Cone beam CT for treatment planning 4. Ultrasound-guided vascular access, right radial artery COMPARISON:  CT angiogram of the head and neck performed August 12, 2024 CLINICAL DATA:  89 year old female with acute ischemic stroke and NIH stroke scale measuring 8.  Symptoms were  driven by hemi anopsia. An appropriate informed consent was obtained and the patient was taken for endovascular therapy. INDICATION: Acute ischemic stroke. ANESTHESIA/SEDATION: General anesthesia was utilized for the procedure. CONTRAST:  Approximately 70 cc Ominipque 300 MEDICATIONS: See MAR FLUOROSCOPY TIME:  Fluoroscopy Time: 55 minutes 6 sec, (981 mGy). COMPLICATIONS: None immediate. BODY OF REPORT: Following a full explanation of the procedure along with the potential associated complications, an informed witnessed consent was obtained. The patient was then placed under general anesthesia by the Department of Anesthesiology at [facility]. The right femoral access site was prepped and draped in the usual sterile fashion. Ultrasound was used to study the right common femoral artery which was patent. Using real-time ultrasound guidance, a 21 gauge introducer needle was used to access the right common femoral artery. Access was performed at 1324. A hard copy image ultrasound the saved and stored in PACS. Using this access, a 6 French sheath was placed in the descending thoracic aorta. Next, selective catheterization the innominate artery was performed and a selective angiogram was performed which demonstrated a severely tortuous arch configuration. Multiple catheters and guidewires were utilized in an exhaustive attempt to gain stable access to the right carotid territory which were unsuccessful. Exchange was made for an 8 French sheath in order to provide improved support. A repeat approach with multiple catheters did not demonstrate significant improvement. At this point, I dedicated my effort to obtaining right radial access in an effort to approach the thrombectomy procedure from a right radial access. Ultrasound was used to study the right radial artery which was patent. Using real-time ultrasound guidance, a 21 gauge needle was used to gain access to the right radial artery. A wire was  placed. Next, a 7 French sheath was placed in the right radial artery. Next, a red 72 catheter was advanced into the right internal carotid artery. A selective angiogram was performed which demonstrated the right M1 occlusion. I was unable to advance the 72 catheter beyond the carotid siphon. Next, multiple microcatheter is were utilized in an effort to gain more distal access which were unsuccessful. This was primary secondary to arterial tortuosity and instability of the access system. At this point, Dr. Todd Burns joined me at my request. Exchange was made for a zoom 88 sheath system. After multiple attempts, he was able to catheterize the right internal carotid artery and advance this sheath system for more stable access. Next, a catalyst support catheter and Trevo Trak microcatheter were ultimately advanced into the right M2 segment. Next, a 4 mm solitaire was deployed. The first pass was performed at 1525. Recanalization was achieved at this time. This yielded the occlusive thrombus and flow was restored to this segment. There was no evidence of active extravasation or complication. Post treatment TICI score 3. After reviewing the imaging, I elected to terminate the procedure at this point. Evaluation of the right femoral access site demonstrated that the site was suitable for a closure device. A 8 French Angio-Seal device was deployed without complication. A TR band was then deployed at the right radial access site. Cone beam CT was then performed to evaluate for intracranial hemorrhage and treatment planning. This demonstrated no evidence of significant acute intracranial hemorrhage. The patient was removed from anesthesia and transferred to recovery in stable condition. IMPRESSION: 1. Stent retriever assisted thrombectomy of right M1 occlusion with successful restoration of flow to the right MCA territory. PLAN: 1. To recovery for routine postoperative supportive care. Electronically Signed   By:  Maude Naegeli  M.D.   On: 08/12/2024 16:33   IR US  Guide Vasc Access Right Result Date: 08/12/2024 PROCEDURE PERFORMED: 1. Cerebral angiography with stroke thrombectomy 2. Ultrasound guided vascular access, right common femoral artery 3. Cone beam CT for treatment planning 4. Ultrasound-guided vascular access, right radial artery COMPARISON:  CT angiogram of the head and neck performed August 12, 2024 CLINICAL DATA:  89 year old female with acute ischemic stroke and NIH stroke scale measuring 8. Symptoms were driven by hemi anopsia. An appropriate informed consent was obtained and the patient was taken for endovascular therapy. INDICATION: Acute ischemic stroke. ANESTHESIA/SEDATION: General anesthesia was utilized for the procedure. CONTRAST:  Approximately 70 cc Ominipque 300 MEDICATIONS: See MAR FLUOROSCOPY TIME:  Fluoroscopy Time: 55 minutes 6 sec, (981 mGy). COMPLICATIONS: None immediate. BODY OF REPORT: Following a full explanation of the procedure along with the potential associated complications, an informed witnessed consent was obtained. The patient was then placed under general anesthesia by the Department of Anesthesiology at [facility]. The right femoral access site was prepped and draped in the usual sterile fashion. Ultrasound was used to study the right common femoral artery which was patent. Using real-time ultrasound guidance, a 21 gauge introducer needle was used to access the right common femoral artery. Access was performed at 1324. A hard copy image ultrasound the saved and stored in PACS. Using this access, a 6 French sheath was placed in the descending thoracic aorta. Next, selective catheterization the innominate artery was performed and a selective angiogram was performed which demonstrated a severely tortuous arch configuration. Multiple catheters and guidewires were utilized in an exhaustive attempt to gain stable access to the right carotid territory which were unsuccessful. Exchange was  made for an 8 French sheath in order to provide improved support. A repeat approach with multiple catheters did not demonstrate significant improvement. At this point, I dedicated my effort to obtaining right radial access in an effort to approach the thrombectomy procedure from a right radial access. Ultrasound was used to study the right radial artery which was patent. Using real-time ultrasound guidance, a 21 gauge needle was used to gain access to the right radial artery. A wire was placed. Next, a 7 French sheath was placed in the right radial artery. Next, a red 72 catheter was advanced into the right internal carotid artery. A selective angiogram was performed which demonstrated the right M1 occlusion. I was unable to advance the 72 catheter beyond the carotid siphon. Next, multiple microcatheter is were utilized in an effort to gain more distal access which were unsuccessful. This was primary secondary to arterial tortuosity and instability of the access system. At this point, Dr. Todd Burns joined me at my request. Exchange was made for a zoom 88 sheath system. After multiple attempts, he was able to catheterize the right internal carotid artery and advance this sheath system for more stable access. Next, a catalyst support catheter and Trevo Trak microcatheter were ultimately advanced into the right M2 segment. Next, a 4 mm solitaire was deployed. The first pass was performed at 1525. Recanalization was achieved at this time. This yielded the occlusive thrombus and flow was restored to this segment. There was no evidence of active extravasation or complication. Post treatment TICI score 3. After reviewing the imaging, I elected to terminate the procedure at this point. Evaluation of the right femoral access site demonstrated that the site was suitable for a closure device. A 8 French Angio-Seal device was deployed without complication.  A TR band was then deployed at the right radial access site. Cone beam CT  was then performed to evaluate for intracranial hemorrhage and treatment planning. This demonstrated no evidence of significant acute intracranial hemorrhage. The patient was removed from anesthesia and transferred to recovery in stable condition. IMPRESSION: 1. Stent retriever assisted thrombectomy of right M1 occlusion with successful restoration of flow to the right MCA territory. PLAN: 1. To recovery for routine postoperative supportive care. Electronically Signed   By: Maude Naegeli M.D.   On: 08/12/2024 16:33   CT C-SPINE NO CHARGE Result Date: 08/12/2024 EXAM: CT CERVICAL SPINE WITHOUT CONTRAST 08/12/2024 01:44:26 PM TECHNIQUE: CT of the cervical spine was performed without the administration of intravenous contrast. Multiplanar reformatted images are provided for review. Automated exposure control, iterative reconstruction, and/or weight based adjustment of the mA/kV was utilized to reduce the radiation dose to as low as reasonably achievable. COMPARISON: None available. CLINICAL HISTORY: Left sided weakness. FINDINGS: CERVICAL SPINE: BONES AND ALIGNMENT: Mild right convex curvature of the cervical spine. Degenerative appearing grade 1 anterolisthesis of C3 on C4 and C4 on C5. No acute fracture or suspicious lesion. DEGENERATIVE CHANGES: Severe disc space narrowing and degenerative endplate changes at C4-C5 and C5-C6 with moderate narrowing at C3-C4. Moderately advanced facet arthrosis on the left at C2-C3 and C3-C4 and bilaterally at C4-C5. No evidence of high grade stenosis. SOFT TISSUES: No prevertebral soft tissue swelling. IMPRESSION: 1. No acute osseous abnormality. 2. Multilevel disc and facet degeneration without evidence of high-grade stenosis. Electronically signed by: Dasie Hamburg MD 08/12/2024 02:54 PM EST RP Workstation: [id number]   CT ANGIO HEAD NECK W WO CM W PERF (CODE STROKE) Result Date: 08/12/2024 EXAM: CTA Head and Neck with Perfusion 08/12/2024 01:44:26 PM TECHNIQUE: CTA of the head  and neck was performed with the administration of intravenous contrast. 3D postprocessing with multiplanar reconstructions and MIPs was performed to evaluate the vascular anatomy. Cerebral perfusion analysis using computed tomography with contrast administration, including post-processing of parametric maps with determination of cerebral blood flow, cerebral blood volume, mean transit time and time-to-maximum. Automated exposure control, iterative reconstruction, and/or weight based adjustment of the mA/kV was utilized to reduce the radiation dose to as low as reasonably achievable. COMPARISON: None available CLINICAL HISTORY: Neuro deficit, acute, stroke suspected. FINDINGS: CTA NECK: AORTIC ARCH AND ARCH VESSELS: Calcified plaque in the aortic arch without a significant stenosis of the arch vessel origins. No dissection or arterial injury. CERVICAL CAROTID ARTERIES: Calcified plaque about the left greater than right carotid bifurcations. Tortuous proximal ICAs with a retropharyngeal course on the right. No dissection, arterial injury, or hemodynamically significant stenosis by NASCET criteria. CERVICAL VERTEBRAL ARTERIES: Dominant right vertebral artery. No dissection, arterial injury, or significant stenosis. LUNGS AND MEDIASTINUM: Mild biapical lung scarring. Enlarged main pulmonary artery measuring 3.9 cm in diameter which can be seen with pulmonary arterial hypertension. SOFT TISSUES: No acute abnormality. BONES: Cervical spine CT reported separately. CTA HEAD: ANTERIOR CIRCULATION: The intracranial internal carotid arteries are patent with mild atherosclerosis not resulting in significant stenosis. The right M1 segment is widely patent proximally, however at the bifurcation there is occlusion of the right M2 inferior division at its origin with moderate distal collateralization. The left MCA and both ACAs are patent without evidence of a significant proximal stenosis. No aneurysm. POSTERIOR CIRCULATION: The  intracranial vertebral arteries are patent to the basilar with mild atherosclerosis not resulting in a significant stenosis. Patent PICA, AICA, and SCA origins are visualized bilaterally. There are  moderately large right and diminutive or absent left posterior communicating arteries. Both PCAs are patent without evidence of a significant proximal stenosis. No aneurysm. OTHER: No dural venous sinus thrombosis on this non-dedicated study. CT PERFUSION: EXAM QUALITY: Exam quality is adequate with diagnostic perfusion maps. No significant motion artifact. Appropriate arterial inflow and venous outflow curves. CORE INFARCT (CBF<30% volume): 0 mL TOTAL HYPOPERFUSION (Tmax>6s volume): 43 mL PENUMBRA: Mismatch volume: 43 mL Mismatch ratio: not applicable Location: right MCA territory Emergent results were discussed by telephone with Dr. CANDIE Mari at 12:55PM on 08/12/2024. IMPRESSION: 1. Occlusion of the right M2 inferior division at its origin. 2. Right MCA penumbra, with the volume being slightly overestimated as the small core infarct visible on CT was not detected by perfusion processing. 3. Mild atherosclerosis elsewhere in the head and neck without a significant stenosis. Electronically signed by: Dasie Hamburg MD 08/12/2024 02:50 PM EST RP Workstation: [id number]   CT HEAD CODE STROKE WO CONTRAST Result Date: 08/12/2024 EXAM: CT HEAD WITHOUT CONTRAST TECHNIQUE: CT of the head was performed without the administration of intravenous contrast. Automated exposure control, iterative reconstruction, and/or weight based adjustment of the mA/kV was utilized to reduce the radiation dose to as low as reasonably achievable. COMPARISON: None available. CLINICAL HISTORY: Neuro deficit, acute, stroke suspected FINDINGS: BRAIN AND VENTRICLES: There is a small area of cortical hypodensity involving the inferior right frontoparietal operculum consistent with an acute infarct. The right MCA is hyperdense. No intracranial  hemorrhage, midline shift, hydrocephalus, or extra-axial fluid collection is identified. Mild cerebral atrophy is within normal limits for age. Confluent hypodensities in the cerebral white matter bilaterally are nonspecific but compatible with severe chronic small vessel ischemic disease. There is an approximately 1 cm partially calcified extra-axial mass in the right cerebellopontine angle cistern which has a broad dural base with the right tentorial leaflet. There is at most minimal mass effect on the right lateral pons. ORBITS: No acute abnormality. SINUSES: No acute abnormality. SOFT TISSUES AND SKULL: No acute soft tissue abnormality. No skull fracture. [place] Stroke Program Early CT Score (ASPECTS) ----- Ganglionic (caudate, IC, lentiform nucleus, insula, M1-M3): 6 Supraganglionic (M4-M6): 3 Total: 9 IMPRESSION: 1. Acute right MCA infarct involving the frontoparietal operculum. No hemorrhage. ASPECTS of 9. 2. Severe chronic small vessel ischemic disease. 3. 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 4. Emergent results were discussed by telephone with Dr. CANDIE Mari at 12:55 PM on 08/12/2024. Electronically signed by: Dasie Hamburg MD 08/12/2024 01:03 PM EST RP Workstation: [id number]    Vitals:   08/13/24 0800 08/13/24 0900 08/13/24 1000 08/13/24 1015  BP: (!) 116/50 (!) 144/38 (!) 90/38 (!) 129/52  Pulse: 70  70 75  Resp: 13  14 18   Temp: 98.6 F (37 C)     TempSrc: Axillary     SpO2: 100%  98% 91%  Weight:      Height:         PHYSICAL EXAM General: critically ill  Psych:  Mood and affect appropriate for situation CV: Regular rate and rhythm on monitor Respiratory:  Regular, unlabored respirations on nasal cannula  GI: Abdomen soft and nontender   NEURO:  Mental Status: drowsy from meds overnight. Not following commands or answering questions.   Cranial Nerves:  II: PERRL. Visual fields no blink to threat on left  III, IV, VI:  EOMI. Eyelids elevate symmetrically.  V: Sensation is intact to light touch and symmetrical to face.  VII: left facial  VIII: hearing intact to voice. IX, X: Palate elevates symmetrically. Phonation is normal.  KP:Dynloizm shrug 5/5. XII: tongue is midline without fasciculations. Motor: right side is purposeful, left leg is purposeful and left arm with flicker to pain Tone: is normal and bulk is normal Sensation- responds to noxious stimuli    Coordination: unable to assess Gait- deferred  Most Recent NIH   1a Level of Conscious.: 2 1b LOC Questions: 2 1c LOC Commands: 2 2 Best Gaze:  3 Visual: 2 4 Facial Palsy: 1 5a Motor Arm - left: 3 5b Motor Arm - Right: 2 6a Motor Leg - Left: 3 6b Motor Leg - Right: 3 7 Limb Ataxia:  8 Sensory:  9 Best Language: 3 10 Dysarthria: 1 11 Extinct. and Inatten.: 1 TOTAL: 25   ASSESSMENT/PLAN  Ms. [facility] is a 89 y.o. female with history of  hx of end-stage renal disease on dialysis, hypertension, hyperlipidemia and hypothyroidism who presents with acute onset left-sided weakness, left hemianopsia and dysarthria.   NIH on Admission 7  Acute Ischemic Infarct:  right MCA s/p mechanical thrombectomy R M1 with TICI 3 recanalization  Etiology:  likely cardioembolic   Code Stroke  CT head Acute right MCA infarct involving the frontoparietal operculum. Small vessel disease.  CTA head & neck Occlusion of the right M2 inferior division at its origin.  CT perfusion  Right MCA penumbra, with the volume being slightly overestimated as the small core infarct visible on CT was not detected by perfusion processing. MRI: 1. Small acute right MCA infarct. 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 Repeat CT head: 1. Mild hyperdensity over the right MCA territory, likely contrast staining following neurointerventional procedure. 2. Advanced chronic ischemic white matter  changes. 2D Echo  EF 60-65%. severe concentric LVH. LA moderately dilated  LDL 35 HgbA1c 5.5 VTE prophylaxis - heparin  SQ No antithrombotic prior to admission, now on aspirin 300 mg suppository daily  Therapy recommendations:  Pending Disposition:  pending   New onset focal seizures  Seizure precautions  Loaded with Valproic acid followed by 300 mg Q8h  On LTM   Hypertension Home meds:  none Stable Was requiring cleviprex gtt, now off  Blood Pressure Goal: SBP 120-160 for first 24 hours then less than 180   Hyperlipidemia Home meds:  atorvastatin  40mg ,  resumed in hospital LDL 35, goal < 70 Continue statin at discharge  Hypothyroidism  Restart home levothyroxine    ESRD on HD MWF Nephro following  HD today   Dysphagia Patient has post-stroke dysphagia, SLP consulted    Diet   Diet NPO time specified   Advance diet as tolerated  Other Stroke Risk Factors elderly   Hospital day # 1   Karna Geralds DNP, ACNPC-AG  [facility]  I have personally obtained history,examined this patient, reviewed notes, independently viewed imaging studies, participated in medical decision making and plan of care.ROS completed by me personally and pertinent positives fully documented  I have made any additions or clarifications directly to the above note. Agree with note above.  Patient presented with right hemispheric stroke from right M1 occlusion and underwent successful mechanical thrombectomy but had witnessed focal seizures earlier this morning requiring treatment with Ativan and is presently sedated and exam is limited but she is arousable and able to move right side purposefully and moves right left leg quite well and withdraws left upper extremity pain.  Recommend close neurological monitoring and strict  blood pressure control as per post interventional protocol.  Hold any further sedation.  Continue valproic acid for seizures and long-term EEG monitoring for silent  seizures.  Discussed with daughter at the bedside and answered questions.  Discussed with Dr. Ray neurointerventionist.  Start aspirin alone for stroke prevention and continue ongoing stroke workup. This patient is critically ill and at significant risk of neurological worsening, death and care requires constant monitoring of vital signs, hemodynamics,respiratory and cardiac monitoring, extensive review of multiple databases, frequent neurological assessment, discussion with family, other specialists and medical decision making of high complexity.I have made any additions or clarifications directly to the above note.This critical care time does not reflect procedure time, or teaching time or supervisory time of PA/NP/Med Resident etc but could involve care discussion time.  I spent 40 minutes of neurocritical care time  in the care of  this patient.      Eather Popp, MD Medical Director [facility] Pager: [phone] 08/13/2024 3:08 PM   To contact Stroke Continuity provider, please refer to [email or web address]. After hours, contact General Neurology

## 2024-08-13 NOTE — Progress Notes (Addendum)
 0106: Called to room by pt's daughter, upon entering, witnessed pt seizing.  Pt with full body jerking, pursed lip breathing, HR in 140s-150s, RR 50-60 bpm, O2sats dropped to high 70s.  Left upward gaze deviation.  Turned pt on side and used BMV for blow by oxygen.  Oxygen saturations returned to high 90%.  Seizure lasted 2-3 minutes and then stopped.  Dr. Lindzen paged at start of event, MD called me during pt's first seizure.  VO to give 2mg  Ativan IV.  MD placed orders for loading dose of IV Depacon which had to come from pharmacy.    While Ativan was being pulled from pyxis, pt began to have 2nd seizure.    0112: Pt began to have 2nd seizure.  Pt with same presentation as before with addition of forehead and facial twitching.  Still on phone with Dr. Lindzen,  MD aware.    0114: 2mg  IV ativan given.  Seizure stopped within 1 minute of receiving dose.    0121: Dr. Lindzen at bedside.  Pt with persistent left upward gaze, rhythmic eye movement, and RUE twitching.  Pt localized to sternal rub with RUE, no movement in LUE.  MD placed orders for cEEG and gave VO to place foley and remove C-collar.  Per Dr. Lindzen, if pt has seizure like activity while waiting on loading dose of Depacon to arrive, give another 2mg  IV ativan.    0145:  Pt with rhythmic RUE twitching.  2mg  IV ativan given.  Twitching stopped.    0200:  Depacon arrived from pharmacy and started.

## 2024-08-13 NOTE — Progress Notes (Signed)
 vLTM started  Alll impedances below 10k   [facility] monitoring  MRI leads used

## 2024-08-13 NOTE — Progress Notes (Signed)
 Pt receives out-pt HD at [facility] [place] on MWF 11:55 am chair time. Will assist as needed.   Randine Mungo Dialysis Navigator [phone]

## 2024-08-13 NOTE — Procedures (Addendum)
 HD Note:  Some information was entered later than the data was gathered due to patient care needs. The stated time with the data is accurate.  Patient treatment performed at bedside.  Patient deeply asleep.  Opened eyes when spoken to loudly.  No response or focused eye contact.  Informed consent signed and in chart.   Access used: Left upper arm fistula Access issues: In the last 30 min of treatment, the arterial pressures were increasing.  BFR reduced first to 350 and then to 300.  Patient was not responsive after rinse back.  BP dropped 92/36. 100 mil returned.  BP recovered.  Patient did not respond even after BP normalized   TX duration: 3.15  Alert, without acute distress.  Total UF removed: 900 ml  Hand-off given to patient's nurse.  Patient was taken to CT   Wylder Macomber L. Lenon, RN [facility].

## 2024-08-13 NOTE — Progress Notes (Signed)
 Pt was previously opening her eyes to voice and following commands. Upon 1800 assessment pt very lethargic and not following commands or opening her eyes. Stat head CT ordered. Dr. Sal came to assess pt.

## 2024-08-13 NOTE — Consult Note (Signed)
 [facility] Renal Consultation Note    Indication for Consultation:  Management of ESRD/hemodialysis; anemia, hypertension/volume and secondary hyperparathyroidism   HPI: Norma Coleman is a 89 y.o. female with ESRD on HD MWF, HTN, Hypothyroidism who was admitted with acute CVA. Presented from home with left sided weakness, dysarthria. Code stroke on arrival. R MCA infarct on CT. Was taken to IR for emergent thrombectomy. Noted new onset of seizures overnight. Management per neurology.   Labs this am. Na 140, K 5.5, Bicarb 18, BUN 71, Hgb 8.4. Plan for dialysis at bedside today.  Seen and examined in room. She is sleeping soundly after MRI. Daughter at bedside, reports patient was in her usual state of health until Monday. Usual dialysis MWF via AVF. Last dialyzed 11/28. Missed Monday due to acute illness. No issues reported with HD.   Past Medical History:  Diagnosis Date   DVT (deep venous thrombosis) (HCC)    GI bleed due to NSAIDs    High cholesterol    Hypertension    Hyperthyroidism    Pneumonia    Skin cancer    Thyroid  disease    Past Surgical History:  Procedure Laterality Date   A/V SHUNT INTERVENTION Left 05/08/2024   Procedure: A/V SHUNT INTERVENTION;  Surgeon: Sheree Penne Bruckner, MD;  Location: [facility];  Service: Cardiovascular;  Laterality: Left;   IR CT HEAD LTD  08/12/2024   IR FLUORO GUIDE CV LINE RIGHT  09/18/2022   IR PERCUTANEOUS ART THROMBECTOMY/INFUSION INTRACRANIAL INC DIAG ANGIO  08/12/2024   IR US  GUIDE VASC ACCESS RIGHT  09/18/2022   IR US  GUIDE VASC ACCESS RIGHT  08/12/2024   IR US  GUIDE VASC ACCESS RIGHT  08/12/2024   KNEE SURGERY     RADIOLOGY WITH ANESTHESIA N/A 08/12/2024   Procedure: RADIOLOGY WITH ANESTHESIA;  Surgeon: Radiologist, Medication, MD;  Location: [facility];  Service: Radiology;  Laterality: N/A;   TONSILLECTOMY     TUBAL LIGATION     VENOUS ANGIOPLASTY  05/08/2024   Procedure: VENOUS ANGIOPLASTY;  Surgeon:  Sheree Penne Bruckner, MD;  Location: [facility];  Service: Cardiovascular;;   No family history on file. Social History:  reports that she has never smoked. She has never used smokeless tobacco. She reports that she does not drink alcohol and does not use drugs. Allergies  Allergen Reactions   Ibuprofen Other (See Comments)    Stomach bleed   Sulfa Antibiotics Other (See Comments)    Unknown    Prior to Admission medications   Medication Sig Start Date End Date Taking? Authorizing Provider  ALPRAZolam  (XANAX ) 1 MG tablet Take 1 mg by mouth at bedtime.   Yes [provider]  atorvastatin  (LIPITOR) 40 MG tablet Take 40 mg by mouth daily. 01/09/20  Yes [provider]  HYDROcodone -acetaminophen  (NORCO/VICODIN) 5-325 MG tablet Take 1 tablet by mouth every 6 (six) hours as needed for moderate pain (pain score 4-6) or severe pain (pain score 7-10).   Yes [provider]  levothyroxine  (SYNTHROID ) 75 MCG tablet Take 75 mcg by mouth daily before breakfast. 06/30/22  Yes [provider]  sevelamer carbonate (RENVELA) 800 MG tablet Take 1,600 mg by mouth 3 (three) times daily.   Yes [provider]   Current Facility-Administered Medications  Medication Dose Route Frequency Provider Last Rate Last Admin    stroke: early stages of recovery book   Does not apply Once de Clint Kill, Cortney E, NP  acetaminophen  (TYLENOL ) tablet 650 mg  650 mg Oral Q4H PRN de Clint Kill, Cortney E, NP       Or   acetaminophen  (TYLENOL ) 160 MG/5ML solution 650 mg  650 mg Per Tube Q4H PRN de Clint Kill, Cortney E, NP       Or   acetaminophen  (TYLENOL ) suppository 650 mg  650 mg Rectal Q4H PRN de Clint Kill, Cortney E, NP       atorvastatin  (LIPITOR) tablet 40 mg  40 mg Oral Daily de La Torre, Cortney E, NP       Chlorhexidine  Gluconate Cloth 2 % PADS 6 each  6 each Topical Q0600 Khaliqdina, Salman, MD   6 each at 08/13/24 0000   Chlorhexidine  Gluconate Cloth 2 % PADS 6 each   6 each Topical Q0600 Norine Manuelita LABOR, MD       clevidipine (CLEVIPREX) infusion 0.5 mg/mL  0-21 mg/hr Intravenous Continuous Ray Coy, MD   Stopped at 08/13/24 0116   [START ON 08/14/2024] doxercalciferol (HECTOROL) injection 6 mcg  6 mcg Intravenous Q T,Th,Sa-HD Norine Manuelita LABOR, MD       heparin  injection 5,000 Units  5,000 Units Subcutaneous Q8H de Clint Kill, Cortney E, NP   5,000 Units at 08/13/24 9348   insulin  aspart (novoLOG ) injection 0-6 Units  0-6 Units Subcutaneous Q4H de Clint Kill, Cortney E, NP   1 Units at 08/12/24 1954   levothyroxine  (SYNTHROID ) tablet 75 mcg  75 mcg Oral QAC breakfast de Clint Kill, Cortney E, NP       senna-docusate (Senokot-S) tablet 1 tablet  1 tablet Oral QHS PRN de Clint Kill, Cortney E, NP       sodium bicarbonate  tablet 650 mg  650 mg Oral BID de Clint Kill, Cortney E, NP       sodium chloride  0.9 % bolus 250 mL  250 mL Intravenous PRN Ray Coy, MD       sodium chloride  flush (NS) 0.9 % injection 3 mL  3 mL Intravenous Once Franklyn Sid SAILOR, MD       valproate (DEPACON) 300 mg in dextrose  5 % 50 mL IVPB  15 mg/kg/day Intravenous Q8H Lindzen, Eric, MD 53 mL/hr at 08/13/24 1000 Infusion Verify at 08/13/24 1000     ROS: As per HPI otherwise negative.  Physical Exam: Vitals:   08/13/24 0800 08/13/24 0900 08/13/24 1000 08/13/24 1015  BP: (!) 116/50 (!) 144/38 (!) 90/38 (!) 129/52  Pulse: 70  70 75  Resp: 13  14 18   Temp: 98.6 F (37 C)     TempSrc: Axillary     SpO2: 100%  98% 91%  Weight:      Height:         General: Elderly woman, nad, sleeping  Head: NCAT, eyes closed  CV: Regular rate, no murmur, no rub  Pulm: normal respiratory effort, lungs clear  Abdomen: non-tender, no masses  Lower extremities: no edema  Neuro: Unable to assess d/t patient sleeping  Dialysis Access: LUE AVF +bruit   Labs: Basic Metabolic Panel: Recent Labs  Lab 08/12/24 1233 08/12/24 1235 08/13/24 0247  NA 139 138 140  K 4.8 4.8 5.5*  CL 101 103 102  CO2  21*  --  18*  GLUCOSE 107* 101* 129*  BUN 77* 70* 71*  CREATININE 10.94* 11.10* 10.97*  CALCIUM  9.5  --  9.3   Liver Function Tests: Recent Labs  Lab 08/12/24 1233 08/13/24 0247  AST 17 22  ALT 9 8  ALKPHOS 332* 275*  BILITOT 0.8 0.7  PROT 6.6 5.4*  ALBUMIN 3.5 2.9*   No results for input(s): LIPASE, AMYLASE in the last 168 hours. No results for input(s): AMMONIA in the last 168 hours. CBC: Recent Labs  Lab 08/12/24 1233 08/12/24 1235 08/13/24 0247  WBC 9.8  --  8.7  NEUTROABS 8.4*  --   --   HGB 10.5* 11.6* 8.4*  HCT 34.3* 34.0* 26.8*  MCV 103.3*  --  100.4*  PLT 213  --  202   Cardiac Enzymes: No results for input(s): CKTOTAL, CKMB, CKMBINDEX, TROPONINI in the last 168 hours. CBG: Recent Labs  Lab 08/12/24 1230 08/12/24 1942 08/12/24 2341 08/13/24 0309 08/13/24 0816  GLUCAP 103* 183* 126* 118* 95   Iron Studies: No results for input(s): IRON, TIBC, TRANSFERRIN, FERRITIN in the last 72 hours. Studies/Results: MR BRAIN WO CONTRAST Result Date: 08/13/2024 EXAM: MRI BRAIN WITHOUT CONTRAST 08/13/2024 09:26:18 AM TECHNIQUE: Multiplanar multisequence MRI of the head/brain was performed without the administration of intravenous contrast. COMPARISON: Comparison head CT 08/13/2024. CLINICAL HISTORY: Stroke, follow up. FINDINGS: The examination is mildly motion degraded. BRAIN AND VENTRICLES: There is a small acute right MCA infarct involving the frontoparietal operculum and insula with trace petechial hemorrhage. There is associated cytotoxic edema without mass effect. Confluent T2 hyperintensities elsewhere in the cerebral white matter bilaterally are nonspecific but compatible with severe chronic small vessel ischemic disease. Mild chronic small vessel changes are present in the pons. A 1 cm partially calcified extra-axial mass is again noted in the right cerebellopontine angle with broad dural base along the tentorium. There is minimal mass effect on  the right lateral pons without brain edema. There is mild cerebral atrophy. No midline shift, hydrocephalus, or extra axial fluid collection is evident. Major intracranial vascular flow voids are preserved. ORBITS: Bilateral cataract extraction. SINUSES AND MASTOIDS: No acute abnormality. BONES AND SOFT TISSUES: Normal marrow signal. No acute soft tissue abnormality. IMPRESSION: 1. Small acute right MCA infarct. 2. Severe chronic small vessel ischemic disease. 3. 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 Electronically signed by: Dasie Hamburg MD 08/13/2024 10:20 AM EST RP Workstation: [id number]   CT HEAD WO CONTRAST ( ) Result Date: 08/13/2024 EXAM: CT HEAD WITHOUT CONTRAST 08/13/2024 02:58:11 AM TECHNIQUE: CT of the head was performed without the administration of intravenous contrast. Automated exposure control, iterative reconstruction, and/or weight based adjustment of the mA/kV was utilized to reduce the radiation dose to as low as reasonably achievable. COMPARISON: Comparison with 08/12/2024. CLINICAL HISTORY: Seizure, new-onset, no history of trauma. FINDINGS: BRAIN AND VENTRICLES: No substantial change from prior. Mild hyperdensity over the right MCA territory compatible with contrast staining following neurointerventional procedure. Advanced chronic ischemic white matter change. Edema with loss of sulcation in the right cerebral hemisphere. No midline shift. Basal cisterns are patent. Unchanged 1 cm partially calcified extra-axial mass in the right cerebellar pontine angle compatible with meningioma. No acute hemorrhage. No hydrocephalus. ORBITS: No acute abnormality. SINUSES: No acute abnormality. SOFT TISSUES AND SKULL: No acute soft tissue abnormality. No skull fracture. IMPRESSION: 1. No change from 12 / 2 / 25 at 8:44 pm. 2. Mild hyperdensity over the right MCA territory compatible with contrast staining following neurointerventional procedure. 3. Edema with loss of  sulcation in the right cerebral hemisphere. No midline shift. 4. Unchanged 1 cm partially calcified extra-axial mass in the right cerebellar pontine angle compatible with meningioma. Electronically signed by: Norman Gatlin MD 08/13/2024 03:07 AM EST RP Workstation: [id number]  CT HEAD WO CONTRAST ( ) Result Date: 08/12/2024 EXAM: CT HEAD WITHOUT CONTRAST 08/12/2024 08:47:00 PM TECHNIQUE: CT of the head was performed without the administration of intravenous contrast. Automated exposure control, iterative reconstruction, and/or weight based adjustment of the mA/kV was utilized to reduce the radiation dose to as low as reasonably achievable. COMPARISON: Head CT performed on 08/12/2024 at 12:37 pm. CLINICAL HISTORY: Neuro deficit, acute, stroke suspected; Worsened NIHSS. FINDINGS: BRAIN AND VENTRICLES: Mild hyperdensity over the right MCA territory likely contrast staining following neurointerventional procedure. There is advanced chronic ischemic white matter change. No acute hemorrhage. No evidence of acute infarct. No hydrocephalus. No extra-axial collection. No mass effect or midline shift. ORBITS: No acute abnormality. SINUSES: No acute abnormality. SOFT TISSUES AND SKULL: No acute soft tissue abnormality. No skull fracture. IMPRESSION: 1. Mild hyperdensity over the right MCA territory, likely contrast staining following neurointerventional procedure. 2. Advanced chronic ischemic white matter changes. Electronically signed by: Franky Stanford MD 08/12/2024 09:34 PM EST RP Workstation: [id number]   DG Chest Portable 1 View Result Date: 08/12/2024 CLINICAL DATA:  Clemens. EXAM: PORTABLE CHEST 1 VIEW COMPARISON:  09/13/2022 FINDINGS: Increased patient rotation to the right. Enlarged cardiac silhouette with improvement. Clear lungs with normal vascularity. Stable minimal chronic interstitial prominence. Minimally displaced right 6th lateral rib fracture, most likely old. IMPRESSION: 1. Minimally displaced right  6th lateral rib fracture, most likely old. 2. Cardiomegaly with improvement. Electronically Signed   By: Elspeth Bathe M.D.   On: 08/12/2024 16:50   IR PERCUTANEOUS ART THROMBECTOMY/INFUSION INTRACRANIAL INC DIAG ANGIO Result Date: 08/12/2024 PROCEDURE PERFORMED: 1. Cerebral angiography with stroke thrombectomy 2. Ultrasound guided vascular access, right common femoral artery 3. Cone beam CT for treatment planning 4. Ultrasound-guided vascular access, right radial artery COMPARISON:  CT angiogram of the head and neck performed August 12, 2024 CLINICAL DATA:  89 year old female with acute ischemic stroke and NIH stroke scale measuring 8. Symptoms were driven by hemi anopsia. An appropriate informed consent was obtained and the patient was taken for endovascular therapy. INDICATION: Acute ischemic stroke. ANESTHESIA/SEDATION: General anesthesia was utilized for the procedure. CONTRAST:  Approximately 70 cc Ominipque 300 MEDICATIONS: See MAR FLUOROSCOPY TIME:  Fluoroscopy Time: 55 minutes 6 sec, (981 mGy). COMPLICATIONS: None immediate. BODY OF REPORT: Following a full explanation of the procedure along with the potential associated complications, an informed witnessed consent was obtained. The patient was then placed under general anesthesia by the Department of Anesthesiology at [facility]. The right femoral access site was prepped and draped in the usual sterile fashion. Ultrasound was used to study the right common femoral artery which was patent. Using real-time ultrasound guidance, a 21 gauge introducer needle was used to access the right common femoral artery. Access was performed at 1324. A hard copy image ultrasound the saved and stored in PACS. Using this access, a 6 French sheath was placed in the descending thoracic aorta. Next, selective catheterization the innominate artery was performed and a selective angiogram was performed which demonstrated a severely tortuous arch configuration. Multiple  catheters and guidewires were utilized in an exhaustive attempt to gain stable access to the right carotid territory which were unsuccessful. Exchange was made for an 8 French sheath in order to provide improved support. A repeat approach with multiple catheters did not demonstrate significant improvement. At this point, I dedicated my effort to obtaining right radial access in an effort to approach the thrombectomy procedure from a right radial access. Ultrasound was used to study the right radial  artery which was patent. Using real-time ultrasound guidance, a 21 gauge needle was used to gain access to the right radial artery. A wire was placed. Next, a 7 French sheath was placed in the right radial artery. Next, a red 72 catheter was advanced into the right internal carotid artery. A selective angiogram was performed which demonstrated the right M1 occlusion. I was unable to advance the 72 catheter beyond the carotid siphon. Next, multiple microcatheter is were utilized in an effort to gain more distal access which were unsuccessful. This was primary secondary to arterial tortuosity and instability of the access system. At this point, Dr. Todd Burns joined me at my request. Exchange was made for a zoom 88 sheath system. After multiple attempts, he was able to catheterize the right internal carotid artery and advance this sheath system for more stable access. Next, a catalyst support catheter and Trevo Trak microcatheter were ultimately advanced into the right M2 segment. Next, a 4 mm solitaire was deployed. The first pass was performed at 1525. Recanalization was achieved at this time. This yielded the occlusive thrombus and flow was restored to this segment. There was no evidence of active extravasation or complication. Post treatment TICI score 3. After reviewing the imaging, I elected to terminate the procedure at this point. Evaluation of the right femoral access site demonstrated that the site was suitable for  a closure device. A 8 French Angio-Seal device was deployed without complication. A TR band was then deployed at the right radial access site. Cone beam CT was then performed to evaluate for intracranial hemorrhage and treatment planning. This demonstrated no evidence of significant acute intracranial hemorrhage. The patient was removed from anesthesia and transferred to recovery in stable condition. IMPRESSION: 1. Stent retriever assisted thrombectomy of right M1 occlusion with successful restoration of flow to the right MCA territory. PLAN: 1. To recovery for routine postoperative supportive care. Electronically Signed   By: Maude Naegeli M.D.   On: 08/12/2024 16:33   IR US  Guide Vasc Access Right Result Date: 08/12/2024 PROCEDURE PERFORMED: 1. Cerebral angiography with stroke thrombectomy 2. Ultrasound guided vascular access, right common femoral artery 3. Cone beam CT for treatment planning 4. Ultrasound-guided vascular access, right radial artery COMPARISON:  CT angiogram of the head and neck performed August 12, 2024 CLINICAL DATA:  89 year old female with acute ischemic stroke and NIH stroke scale measuring 8. Symptoms were driven by hemi anopsia. An appropriate informed consent was obtained and the patient was taken for endovascular therapy. INDICATION: Acute ischemic stroke. ANESTHESIA/SEDATION: General anesthesia was utilized for the procedure. CONTRAST:  Approximately 70 cc Ominipque 300 MEDICATIONS: See MAR FLUOROSCOPY TIME:  Fluoroscopy Time: 55 minutes 6 sec, (981 mGy). COMPLICATIONS: None immediate. BODY OF REPORT: Following a full explanation of the procedure along with the potential associated complications, an informed witnessed consent was obtained. The patient was then placed under general anesthesia by the Department of Anesthesiology at [facility]. The right femoral access site was prepped and draped in the usual sterile fashion. Ultrasound was used to study the right common femoral  artery which was patent. Using real-time ultrasound guidance, a 21 gauge introducer needle was used to access the right common femoral artery. Access was performed at 1324. A hard copy image ultrasound the saved and stored in PACS. Using this access, a 6 French sheath was placed in the descending thoracic aorta. Next, selective catheterization the innominate artery was performed and a selective angiogram was performed which demonstrated a severely tortuous  arch configuration. Multiple catheters and guidewires were utilized in an exhaustive attempt to gain stable access to the right carotid territory which were unsuccessful. Exchange was made for an 8 French sheath in order to provide improved support. A repeat approach with multiple catheters did not demonstrate significant improvement. At this point, I dedicated my effort to obtaining right radial access in an effort to approach the thrombectomy procedure from a right radial access. Ultrasound was used to study the right radial artery which was patent. Using real-time ultrasound guidance, a 21 gauge needle was used to gain access to the right radial artery. A wire was placed. Next, a 7 French sheath was placed in the right radial artery. Next, a red 72 catheter was advanced into the right internal carotid artery. A selective angiogram was performed which demonstrated the right M1 occlusion. I was unable to advance the 72 catheter beyond the carotid siphon. Next, multiple microcatheter is were utilized in an effort to gain more distal access which were unsuccessful. This was primary secondary to arterial tortuosity and instability of the access system. At this point, Dr. Todd Burns joined me at my request. Exchange was made for a zoom 88 sheath system. After multiple attempts, he was able to catheterize the right internal carotid artery and advance this sheath system for more stable access. Next, a catalyst support catheter and Trevo Trak microcatheter were ultimately  advanced into the right M2 segment. Next, a 4 mm solitaire was deployed. The first pass was performed at 1525. Recanalization was achieved at this time. This yielded the occlusive thrombus and flow was restored to this segment. There was no evidence of active extravasation or complication. Post treatment TICI score 3. After reviewing the imaging, I elected to terminate the procedure at this point. Evaluation of the right femoral access site demonstrated that the site was suitable for a closure device. A 8 French Angio-Seal device was deployed without complication. A TR band was then deployed at the right radial access site. Cone beam CT was then performed to evaluate for intracranial hemorrhage and treatment planning. This demonstrated no evidence of significant acute intracranial hemorrhage. The patient was removed from anesthesia and transferred to recovery in stable condition. IMPRESSION: 1. Stent retriever assisted thrombectomy of right M1 occlusion with successful restoration of flow to the right MCA territory. PLAN: 1. To recovery for routine postoperative supportive care. Electronically Signed   By: Maude Naegeli M.D.   On: 08/12/2024 16:33   IR CT Head Ltd Result Date: 08/12/2024 PROCEDURE PERFORMED: 1. Cerebral angiography with stroke thrombectomy 2. Ultrasound guided vascular access, right common femoral artery 3. Cone beam CT for treatment planning 4. Ultrasound-guided vascular access, right radial artery COMPARISON:  CT angiogram of the head and neck performed August 12, 2024 CLINICAL DATA:  89 year old female with acute ischemic stroke and NIH stroke scale measuring 8. Symptoms were driven by hemi anopsia. An appropriate informed consent was obtained and the patient was taken for endovascular therapy. INDICATION: Acute ischemic stroke. ANESTHESIA/SEDATION: General anesthesia was utilized for the procedure. CONTRAST:  Approximately 70 cc Ominipque 300 MEDICATIONS: See MAR FLUOROSCOPY TIME:   Fluoroscopy Time: 55 minutes 6 sec, (981 mGy). COMPLICATIONS: None immediate. BODY OF REPORT: Following a full explanation of the procedure along with the potential associated complications, an informed witnessed consent was obtained. The patient was then placed under general anesthesia by the Department of Anesthesiology at [facility]. The right femoral access site was prepped and draped in the usual sterile fashion.  Ultrasound was used to study the right common femoral artery which was patent. Using real-time ultrasound guidance, a 21 gauge introducer needle was used to access the right common femoral artery. Access was performed at 1324. A hard copy image ultrasound the saved and stored in PACS. Using this access, a 6 French sheath was placed in the descending thoracic aorta. Next, selective catheterization the innominate artery was performed and a selective angiogram was performed which demonstrated a severely tortuous arch configuration. Multiple catheters and guidewires were utilized in an exhaustive attempt to gain stable access to the right carotid territory which were unsuccessful. Exchange was made for an 8 French sheath in order to provide improved support. A repeat approach with multiple catheters did not demonstrate significant improvement. At this point, I dedicated my effort to obtaining right radial access in an effort to approach the thrombectomy procedure from a right radial access. Ultrasound was used to study the right radial artery which was patent. Using real-time ultrasound guidance, a 21 gauge needle was used to gain access to the right radial artery. A wire was placed. Next, a 7 French sheath was placed in the right radial artery. Next, a red 72 catheter was advanced into the right internal carotid artery. A selective angiogram was performed which demonstrated the right M1 occlusion. I was unable to advance the 72 catheter beyond the carotid siphon. Next, multiple microcatheter is  were utilized in an effort to gain more distal access which were unsuccessful. This was primary secondary to arterial tortuosity and instability of the access system. At this point, Dr. Todd Burns joined me at my request. Exchange was made for a zoom 88 sheath system. After multiple attempts, he was able to catheterize the right internal carotid artery and advance this sheath system for more stable access. Next, a catalyst support catheter and Trevo Trak microcatheter were ultimately advanced into the right M2 segment. Next, a 4 mm solitaire was deployed. The first pass was performed at 1525. Recanalization was achieved at this time. This yielded the occlusive thrombus and flow was restored to this segment. There was no evidence of active extravasation or complication. Post treatment TICI score 3. After reviewing the imaging, I elected to terminate the procedure at this point. Evaluation of the right femoral access site demonstrated that the site was suitable for a closure device. A 8 French Angio-Seal device was deployed without complication. A TR band was then deployed at the right radial access site. Cone beam CT was then performed to evaluate for intracranial hemorrhage and treatment planning. This demonstrated no evidence of significant acute intracranial hemorrhage. The patient was removed from anesthesia and transferred to recovery in stable condition. IMPRESSION: 1. Stent retriever assisted thrombectomy of right M1 occlusion with successful restoration of flow to the right MCA territory. PLAN: 1. To recovery for routine postoperative supportive care. Electronically Signed   By: Maude Naegeli M.D.   On: 08/12/2024 16:33   IR US  Guide Vasc Access Right Result Date: 08/12/2024 PROCEDURE PERFORMED: 1. Cerebral angiography with stroke thrombectomy 2. Ultrasound guided vascular access, right common femoral artery 3. Cone beam CT for treatment planning 4. Ultrasound-guided vascular access, right radial artery  COMPARISON:  CT angiogram of the head and neck performed August 12, 2024 CLINICAL DATA:  89 year old female with acute ischemic stroke and NIH stroke scale measuring 8. Symptoms were driven by hemi anopsia. An appropriate informed consent was obtained and the patient was taken for endovascular therapy. INDICATION: Acute ischemic stroke. ANESTHESIA/SEDATION: General anesthesia  was utilized for the procedure. CONTRAST:  Approximately 70 cc Ominipque 300 MEDICATIONS: See MAR FLUOROSCOPY TIME:  Fluoroscopy Time: 55 minutes 6 sec, (981 mGy). COMPLICATIONS: None immediate. BODY OF REPORT: Following a full explanation of the procedure along with the potential associated complications, an informed witnessed consent was obtained. The patient was then placed under general anesthesia by the Department of Anesthesiology at [facility]. The right femoral access site was prepped and draped in the usual sterile fashion. Ultrasound was used to study the right common femoral artery which was patent. Using real-time ultrasound guidance, a 21 gauge introducer needle was used to access the right common femoral artery. Access was performed at 1324. A hard copy image ultrasound the saved and stored in PACS. Using this access, a 6 French sheath was placed in the descending thoracic aorta. Next, selective catheterization the innominate artery was performed and a selective angiogram was performed which demonstrated a severely tortuous arch configuration. Multiple catheters and guidewires were utilized in an exhaustive attempt to gain stable access to the right carotid territory which were unsuccessful. Exchange was made for an 8 French sheath in order to provide improved support. A repeat approach with multiple catheters did not demonstrate significant improvement. At this point, I dedicated my effort to obtaining right radial access in an effort to approach the thrombectomy procedure from a right radial access. Ultrasound was used  to study the right radial artery which was patent. Using real-time ultrasound guidance, a 21 gauge needle was used to gain access to the right radial artery. A wire was placed. Next, a 7 French sheath was placed in the right radial artery. Next, a red 72 catheter was advanced into the right internal carotid artery. A selective angiogram was performed which demonstrated the right M1 occlusion. I was unable to advance the 72 catheter beyond the carotid siphon. Next, multiple microcatheter is were utilized in an effort to gain more distal access which were unsuccessful. This was primary secondary to arterial tortuosity and instability of the access system. At this point, Dr. Todd Burns joined me at my request. Exchange was made for a zoom 88 sheath system. After multiple attempts, he was able to catheterize the right internal carotid artery and advance this sheath system for more stable access. Next, a catalyst support catheter and Trevo Trak microcatheter were ultimately advanced into the right M2 segment. Next, a 4 mm solitaire was deployed. The first pass was performed at 1525. Recanalization was achieved at this time. This yielded the occlusive thrombus and flow was restored to this segment. There was no evidence of active extravasation or complication. Post treatment TICI score 3. After reviewing the imaging, I elected to terminate the procedure at this point. Evaluation of the right femoral access site demonstrated that the site was suitable for a closure device. A 8 French Angio-Seal device was deployed without complication. A TR band was then deployed at the right radial access site. Cone beam CT was then performed to evaluate for intracranial hemorrhage and treatment planning. This demonstrated no evidence of significant acute intracranial hemorrhage. The patient was removed from anesthesia and transferred to recovery in stable condition. IMPRESSION: 1. Stent retriever assisted thrombectomy of right M1 occlusion  with successful restoration of flow to the right MCA territory. PLAN: 1. To recovery for routine postoperative supportive care. Electronically Signed   By: Maude Naegeli M.D.   On: 08/12/2024 16:33   CT C-SPINE NO CHARGE Result Date: 08/12/2024 EXAM: CT CERVICAL SPINE WITHOUT CONTRAST  08/12/2024 01:44:26 PM TECHNIQUE: CT of the cervical spine was performed without the administration of intravenous contrast. Multiplanar reformatted images are provided for review. Automated exposure control, iterative reconstruction, and/or weight based adjustment of the mA/kV was utilized to reduce the radiation dose to as low as reasonably achievable. COMPARISON: None available. CLINICAL HISTORY: Left sided weakness. FINDINGS: CERVICAL SPINE: BONES AND ALIGNMENT: Mild right convex curvature of the cervical spine. Degenerative appearing grade 1 anterolisthesis of C3 on C4 and C4 on C5. No acute fracture or suspicious lesion. DEGENERATIVE CHANGES: Severe disc space narrowing and degenerative endplate changes at C4-C5 and C5-C6 with moderate narrowing at C3-C4. Moderately advanced facet arthrosis on the left at C2-C3 and C3-C4 and bilaterally at C4-C5. No evidence of high grade stenosis. SOFT TISSUES: No prevertebral soft tissue swelling. IMPRESSION: 1. No acute osseous abnormality. 2. Multilevel disc and facet degeneration without evidence of high-grade stenosis. Electronically signed by: Dasie Hamburg MD 08/12/2024 02:54 PM EST RP Workstation: [id number]   CT ANGIO HEAD NECK W WO CM W PERF (CODE STROKE) Result Date: 08/12/2024 EXAM: CTA Head and Neck with Perfusion 08/12/2024 01:44:26 PM TECHNIQUE: CTA of the head and neck was performed with the administration of intravenous contrast. 3D postprocessing with multiplanar reconstructions and MIPs was performed to evaluate the vascular anatomy. Cerebral perfusion analysis using computed tomography with contrast administration, including post-processing of parametric maps with  determination of cerebral blood flow, cerebral blood volume, mean transit time and time-to-maximum. Automated exposure control, iterative reconstruction, and/or weight based adjustment of the mA/kV was utilized to reduce the radiation dose to as low as reasonably achievable. COMPARISON: None available CLINICAL HISTORY: Neuro deficit, acute, stroke suspected. FINDINGS: CTA NECK: AORTIC ARCH AND ARCH VESSELS: Calcified plaque in the aortic arch without a significant stenosis of the arch vessel origins. No dissection or arterial injury. CERVICAL CAROTID ARTERIES: Calcified plaque about the left greater than right carotid bifurcations. Tortuous proximal ICAs with a retropharyngeal course on the right. No dissection, arterial injury, or hemodynamically significant stenosis by NASCET criteria. CERVICAL VERTEBRAL ARTERIES: Dominant right vertebral artery. No dissection, arterial injury, or significant stenosis. LUNGS AND MEDIASTINUM: Mild biapical lung scarring. Enlarged main pulmonary artery measuring 3.9 cm in diameter which can be seen with pulmonary arterial hypertension. SOFT TISSUES: No acute abnormality. BONES: Cervical spine CT reported separately. CTA HEAD: ANTERIOR CIRCULATION: The intracranial internal carotid arteries are patent with mild atherosclerosis not resulting in significant stenosis. The right M1 segment is widely patent proximally, however at the bifurcation there is occlusion of the right M2 inferior division at its origin with moderate distal collateralization. The left MCA and both ACAs are patent without evidence of a significant proximal stenosis. No aneurysm. POSTERIOR CIRCULATION: The intracranial vertebral arteries are patent to the basilar with mild atherosclerosis not resulting in a significant stenosis. Patent PICA, AICA, and SCA origins are visualized bilaterally. There are moderately large right and diminutive or absent left posterior communicating arteries. Both PCAs are patent without  evidence of a significant proximal stenosis. No aneurysm. OTHER: No dural venous sinus thrombosis on this non-dedicated study. CT PERFUSION: EXAM QUALITY: Exam quality is adequate with diagnostic perfusion maps. No significant motion artifact. Appropriate arterial inflow and venous outflow curves. CORE INFARCT (CBF<30% volume): 0 mL TOTAL HYPOPERFUSION (Tmax>6s volume): 43 mL PENUMBRA: Mismatch volume: 43 mL Mismatch ratio: not applicable Location: right MCA territory Emergent results were discussed by telephone with Dr. CANDIE Mari at 12:55PM on 08/12/2024. IMPRESSION: 1. Occlusion of the right M2 inferior division at its  origin. 2. Right MCA penumbra, with the volume being slightly overestimated as the small core infarct visible on CT was not detected by perfusion processing. 3. Mild atherosclerosis elsewhere in the head and neck without a significant stenosis. Electronically signed by: Dasie Hamburg MD 08/12/2024 02:50 PM EST RP Workstation: [id number]   CT HEAD CODE STROKE WO CONTRAST Result Date: 08/12/2024 EXAM: CT HEAD WITHOUT CONTRAST TECHNIQUE: CT of the head was performed without the administration of intravenous contrast. Automated exposure control, iterative reconstruction, and/or weight based adjustment of the mA/kV was utilized to reduce the radiation dose to as low as reasonably achievable. COMPARISON: None available. CLINICAL HISTORY: Neuro deficit, acute, stroke suspected FINDINGS: BRAIN AND VENTRICLES: There is a small area of cortical hypodensity involving the inferior right frontoparietal operculum consistent with an acute infarct. The right MCA is hyperdense. No intracranial hemorrhage, midline shift, hydrocephalus, or extra-axial fluid collection is identified. Mild cerebral atrophy is within normal limits for age. Confluent hypodensities in the cerebral white matter bilaterally are nonspecific but compatible with severe chronic small vessel ischemic disease. There is an approximately 1 cm  partially calcified extra-axial mass in the right cerebellopontine angle cistern which has a broad dural base with the right tentorial leaflet. There is at most minimal mass effect on the right lateral pons. ORBITS: No acute abnormality. SINUSES: No acute abnormality. SOFT TISSUES AND SKULL: No acute soft tissue abnormality. No skull fracture. [place] Stroke Program Early CT Score (ASPECTS) ----- Ganglionic (caudate, IC, lentiform nucleus, insula, M1-M3): 6 Supraganglionic (M4-M6): 3 Total: 9 IMPRESSION: 1. Acute right MCA infarct involving the frontoparietal operculum. No hemorrhage. ASPECTS of 9. 2. Severe chronic small vessel ischemic disease. 3. 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 4. Emergent results were discussed by telephone with Dr. CANDIE Mari at 12:55 PM on 08/12/2024. Electronically signed by: Dasie Hamburg MD 08/12/2024 01:03 PM EST RP Workstation: [id number]    Dialysis Orders:  AF MWF 3:15 350/600 EDW 57.5 kg 2K/2Ca AVF 16g Heparin  3000 +2000 midrun No ESA orders. Venofer 50 q 2k Hectorol  6 q HD, sevelamer  2 q ac   Assessment/Plan: Acute CVA. R MCA infarct s/p IR thrombectomy. C/b seizure activity overnight. Management per neuro.  ESRD.  HD MWF. Missed Monday d/t illness.  HD today at bedside.  Hypertension. BP parameters per neuro. Takes midodrine  prn as outpatient.  Volume. No gross volume on exam. UF as able.  Anemia. Hgb 8.4.  Defer ESA in setting of stroke.  Metabolic bone disease.  Follow Ca/Phos trend. Resume home meds when eating  Nutrition. Currently NPO.   Maisie Ronnald Acosta PA-C [facility] 08/13/2024, 10:37 AM

## 2024-08-14 ENCOUNTER — Other Ambulatory Visit: Payer: Self-pay | Admitting: Cardiology

## 2024-08-14 ENCOUNTER — Inpatient Hospital Stay (HOSPITAL_COMMUNITY)

## 2024-08-14 DIAGNOSIS — I639 Cerebral infarction, unspecified: Secondary | ICD-10-CM

## 2024-08-14 LAB — BASIC METABOLIC PANEL WITH GFR
Anion gap: 13 (ref 5–15)
BUN: 31 mg/dL — ABNORMAL HIGH (ref 8–23)
CO2: 24 mmol/L (ref 22–32)
Calcium: 8.3 mg/dL — ABNORMAL LOW (ref 8.9–10.3)
Chloride: 95 mmol/L — ABNORMAL LOW (ref 98–111)
Creatinine, Ser: 5.66 mg/dL — ABNORMAL HIGH (ref 0.44–1.00)
GFR, Estimated: 7 mL/min — ABNORMAL LOW (ref >60)
Glucose, Bld: 77 mg/dL (ref 70–99)
Potassium: 4.3 mmol/L (ref 3.5–5.1)
Sodium: 132 mmol/L — ABNORMAL LOW (ref 135–145)

## 2024-08-14 LAB — CBC
HCT: 23.8 % — ABNORMAL LOW (ref 36.0–46.0)
Hemoglobin: 7.7 g/dL — ABNORMAL LOW (ref 12.0–15.0)
MCH: 31.3 pg (ref 26.0–34.0)
MCHC: 32.4 g/dL (ref 30.0–36.0)
MCV: 96.7 fL (ref 80.0–100.0)
Platelets: 177 K/uL (ref 150–400)
RBC: 2.46 MIL/uL — ABNORMAL LOW (ref 3.87–5.11)
RDW: 14.8 % (ref 11.5–15.5)
WBC: 9.9 K/uL (ref 4.0–10.5)
nRBC: 0 % (ref 0.0–0.2)

## 2024-08-14 LAB — GLUCOSE, CAPILLARY
Glucose-Capillary: 78 mg/dL (ref 70–99)
Glucose-Capillary: 82 mg/dL (ref 70–99)
Glucose-Capillary: 82 mg/dL (ref 70–99)
Glucose-Capillary: 91 mg/dL (ref 70–99)
Glucose-Capillary: 74 mg/dL (ref 70–99)
Glucose-Capillary: 66 mg/dL — ABNORMAL LOW (ref 70–99)

## 2024-08-14 LAB — VALPROIC ACID LEVEL
Valproic Acid Lvl: 52 ug/mL (ref 50–100)
Valproic Acid Lvl: 43 ug/mL — ABNORMAL LOW (ref 50–100)

## 2024-08-14 LAB — PHOSPHORUS: Phosphorus: 5.7 mg/dL — ABNORMAL HIGH (ref 2.5–4.6)

## 2024-08-14 MED ORDER — VALPROATE SODIUM 100 MG/ML IV SOLN
400.0000 mg | Freq: Three times a day (TID) | INTRAVENOUS | Status: DC
  Administered 2024-08-14: 400 mg via INTRAVENOUS
  Filled 2024-08-14 (×3): qty 4

## 2024-08-14 MED ORDER — DARBEPOETIN ALFA 100 MCG/0.5ML IJ SOSY
100.0000 ug | PREFILLED_SYRINGE | INTRAMUSCULAR | Status: DC
  Administered 2024-08-15: 100 ug via SUBCUTANEOUS
  Filled 2024-08-14 (×2): qty 0.5

## 2024-08-14 MED ORDER — SODIUM CHLORIDE 0.9 % IV BOLUS: 500.0000 mL | Freq: Once | INTRAVENOUS | Status: DC

## 2024-08-14 NOTE — Progress Notes (Signed)
 LTM VIDEO EEG discontinued - no skin breakdown at [facility].

## 2024-08-14 NOTE — Progress Notes (Signed)
 Norma Coleman Progress Note   Subjective:   Remains in neuro ICU.  Had HD bedside yesterday 0.9L UF.    Objective Vitals:   08/14/24 0500 08/14/24 0600 08/14/24 0700 08/14/24 0800  BP: 126/63 124/67 (!) 107/51   Pulse: (!) 115 (!) 114 98   Resp: 20 20 15    Temp:    98.7 F (37.1 C)  TempSrc:    Axillary  SpO2: 94% 97% 96%   Weight:      Height:       Physical Exam General: arousable to voice but not interacting with me Heart: RRR Lungs: clear on Meno Abdomen:soft Extremities: no edema Dialysis Access: LUE AVF +t/b  Additional Objective Labs: Basic Metabolic Panel: Recent Labs  Lab 08/12/24 1233 08/12/24 1235 08/13/24 0247 08/14/24 0401  NA 139 138 140 132*  K 4.8 4.8 5.5* 4.3  CL 101 103 102 95*  CO2 21*  --  18* 24  GLUCOSE 107* 101* 129* 77  BUN 77* 70* 71* 31*  CREATININE 10.94* 11.10* 10.97* 5.66*  CALCIUM  9.5  --  9.3 8.3*  PHOS  --   --   --  5.7*   Liver Function Tests: Recent Labs  Lab 08/12/24 1233 08/13/24 0247  AST 17 22  ALT 9 8  ALKPHOS 332* 275*  BILITOT 0.8 0.7  PROT 6.6 5.4*  ALBUMIN 3.5 2.9*   No results for input(s): LIPASE, AMYLASE in the last 168 hours. CBC: Recent Labs  Lab 08/12/24 1233 08/12/24 1235 08/13/24 0247 08/14/24 0401  WBC 9.8  --  8.7 9.9  NEUTROABS 8.4*  --   --   --   HGB 10.5* 11.6* 8.4* 7.7*  HCT 34.3* 34.0* 26.8* 23.8*  MCV 103.3*  --  100.4* 96.7  PLT 213  --  202 177   Blood Culture    Component Value Date/Time   SDES  08/05/2022 2230    BLOOD SITE NOT SPECIFIED Performed at [facility], [address], [place], [place] [address]    SPECREQUEST  08/05/2022 2230    BOTTLES DRAWN AEROBIC AND ANAEROBIC Blood Culture adequate volume Performed at [facility], [address], [place], [place] [address]    CULT  08/05/2022 2230    NO GROWTH 5 DAYS Performed at [facility], [address] [place], [place] [address]    REPTSTATUS  08/11/2022 FINAL 08/05/2022 2230    Cardiac Enzymes: No results for input(s): CKTOTAL, CKMB, CKMBINDEX, TROPONINI in the last 168 hours. CBG: Recent Labs  Lab 08/13/24 1603 08/13/24 1931 08/13/24 2330 08/14/24 0332 08/14/24 0715  GLUCAP 94 81 77 78 82   Iron Studies: No results for input(s): IRON, TIBC, TRANSFERRIN, FERRITIN in the last 72 hours. @lablastinr3 @ Studies/Results: MR BRAIN WO CONTRAST Result Date: 08/13/2024 EXAM: MR Angiography Head without intravenous Contrast. 08/13/2024 09:34:24 PM TECHNIQUE: Magnetic resonance angiography images of the head without intravenous contrast. Multiplanar 2D and 3D reformatted images are provided for review. COMPARISON: None provided. CLINICAL HISTORY: Neuro deficit, acute, stroke suspected; limited diffusion and flair imaging. FINDINGS: BRAIN PARENCHYMA: Intermediate-sized acute/early subacute infarct of the right frontal operculum and insula. Scattered punctate foci of acute ischemia in the left parietal lobe and right frontal white matter. Early confluent hyperintense T2-weighted signal within the cerebral white matter, most commonly due to chronic small vessel disease. Prominent. ANTERIOR CIRCULATION: No significant stenosis of the internal carotid arteries. No significant stenosis of the anterior cerebral arteries. No significant stenosis of the  middle cerebral arteries. No aneurysm. POSTERIOR CIRCULATION: No significant stenosis of the posterior cerebral arteries. No significant stenosis of the basilar artery. No significant stenosis of the vertebral arteries. No aneurysm. IMPRESSION: 1. Intermediate-sized acute/early subacute infarct of the right frontal operculum and insula. 2. Scattered punctate foci of acute ischemia in the left parietal lobe and right frontal white matter. 3. Early confluent hyperintense T2-weighted signal within the cerebral white matter, most commonly due to chronic small vessel disease. Electronically  signed by: Franky Stanford MD 08/13/2024 10:58 PM EST RP Workstation: [id number]   MR ANGIO HEAD WO CONTRAST Result Date: 08/13/2024 EXAM: MR Angiography Head without intravenous Contrast. 08/13/2024 09:34:24 PM TECHNIQUE: Magnetic resonance angiography images of the head without intravenous contrast. Multiplanar 2D and 3D reformatted images are provided for review. COMPARISON: None provided. CLINICAL HISTORY: Neuro deficit, acute, stroke suspected; limited diffusion and flair imaging. FINDINGS: BRAIN PARENCHYMA: Intermediate-sized acute/early subacute infarct of the right frontal operculum and insula. Scattered punctate foci of acute ischemia in the left parietal lobe and right frontal white matter. Early confluent hyperintense T2-weighted signal within the cerebral white matter, most commonly due to chronic small vessel disease. Prominent. ANTERIOR CIRCULATION: No significant stenosis of the internal carotid arteries. No significant stenosis of the anterior cerebral arteries. No significant stenosis of the middle cerebral arteries. No aneurysm. POSTERIOR CIRCULATION: No significant stenosis of the posterior cerebral arteries. No significant stenosis of the basilar artery. No significant stenosis of the vertebral arteries. No aneurysm. IMPRESSION: 1. Intermediate-sized acute/early subacute infarct of the right frontal operculum and insula. 2. Scattered punctate foci of acute ischemia in the left parietal lobe and right frontal white matter. 3. Early confluent hyperintense T2-weighted signal within the cerebral white matter, most commonly due to chronic small vessel disease. Electronically signed by: Franky Stanford MD 08/13/2024 10:58 PM EST RP Workstation: [id number]   CT HEAD WO CONTRAST ( ) Result Date: 08/13/2024 EXAM: CT HEAD WITH CONTRAST 08/13/2024 06:21:25 PM TECHNIQUE: CT of the head was performed with the administration of intravenous contrast. Automated exposure control, iterative reconstruction,  and/or weight based adjustment of the mA/kV was utilized to reduce the radiation dose to as low as reasonably achievable. COMPARISON: 08/13/2024 CLINICAL HISTORY: Mental status change, persistent or worsening. FINDINGS: BRAIN AND VENTRICLES: Diffuse edema with loss of cortical sulcation throughout the right MCA territory. Superimposed contrast contained within this region. Degree of edema and loss of gray-white matter differentiation is overall increased in size as compared to the infarct as seen on prior brain MRI, raising the concern for possible interval expansion of the right MCA territory infarct. Difficult to be certain of the extent of ischemia given the superimposed contrast and staining. No acute intracranial hemorrhage. No other significant findings regional mass effect or midline shift. No other new large vessel territory infarct. No other acute intracranial hemorrhage. No mass lesion or hydrocephalus. No acute fluid collection. Underlying advanced changes of chronic microvascular ischemic disease noted. ORBITS: No acute abnormality. SINUSES: No acute abnormality. SOFT TISSUES AND SKULL: No convincing abnormal hyperdense vessel. Calcified atherosclerosis present about the skull base. EEG leads overlying the scalp. No acute soft tissue abnormality. No skull fracture. IMPRESSION: 1. Evolving right MCA territory infarct with associated loss of gray-white matter differentiation and cortical sulcation, with persistent superimposed contrast scanning. Overall, the degree of gray-white matter differentiation loss appears increased as compared to prior MRI performed earlier the same day, potentially reflecting interval expansion of the right MCA territory infarct. Difficult to be certain of this finding given the superimposed  contrast staining. Correlation with repeat brain MRI and could be performed for further evaluation as warranted. 2. No acute intracranial hemorrhage. 3. No other new intracranial abnormality.  4. Underlying Advanced chronic microvascular ischemic changes. Electronically signed by: Morene Hoard MD 08/13/2024 07:36 PM EST RP Workstation: [id number]   ECHOCARDIOGRAM COMPLETE Result Date: 08/13/2024    ECHOCARDIOGRAM REPORT   Patient Name:   Norma Coleman Date of Exam: 08/13/2024 Medical Rec #:  [id number]         Height:       63.0 in Accession #:    [id number]        Weight:       132.3 lb Date of Birth:  [birth year]        BSA:          1.622 m Patient Age:    89 years          BP:           142/45 mmHg Patient Gender: F                 HR:           73 bpm. Exam Location:  Inpatient Procedure: 2D Echo, Cardiac Doppler and Color Doppler (Both Spectral and Color            Flow Doppler were utilized during procedure). Indications:    Stroke  History:        Patient has no prior history of Echocardiogram examinations.                 Cardiomegaly; Risk Factors:Dyslipidemia, Hypertension and                 Diabetes.  Sonographer:    Sherlean Dubin Referring Phys: [id number] CORTNEY E DE LA TORRE  Sonographer Comments: Image acquisition challenging due to respiratory motion and Image acquisition challenging due to patient body habitus. IMPRESSIONS  1. Left ventricular ejection fraction, by estimation, is 60 to 65%. The left ventricle has normal function. The left ventricle has no regional wall motion abnormalities. There is severe concentric left ventricular hypertrophy.  2. Right ventricular systolic function is normal. The right ventricular size is normal.  3. Left atrial size was moderately dilated.  4. The mitral valve is degenerative. No evidence of mitral valve regurgitation. No evidence of mitral stenosis. Moderate to severe mitral annular calcification.  5. The aortic valve was not well visualized. Aortic valve regurgitation is not visualized. No aortic stenosis is present.  6. The inferior vena cava is normal in size with greater than 50% respiratory variability, suggesting right atrial  pressure of 3 mmHg. Comparison(s): No prior Echocardiogram. FINDINGS  Left Ventricle: Left ventricular ejection fraction, by estimation, is 60 to 65%. The left ventricle has normal function. The left ventricle has no regional wall motion abnormalities. The left ventricular internal cavity size was normal in size. There is  severe concentric left ventricular hypertrophy. Left ventricular diastolic function could not be evaluated due to mitral annular calcification (moderate or greater). Right Ventricle: The right ventricular size is normal. No increase in right ventricular wall thickness. Right ventricular systolic function is normal. Left Atrium: Left atrial size was moderately dilated. Right Atrium: Right atrial size was normal in size. Pericardium: There is no evidence of pericardial effusion. Mitral Valve: The mitral valve is degenerative in appearance. Moderate to severe mitral annular calcification. No evidence of mitral valve regurgitation. No evidence of mitral valve stenosis. Tricuspid Valve: The tricuspid valve is  normal in structure. Tricuspid valve regurgitation is mild . No evidence of tricuspid stenosis. Aortic Valve: The aortic valve was not well visualized. Aortic valve regurgitation is not visualized. No aortic stenosis is present. Aortic valve mean gradient measures 8.5 mmHg. Aortic valve peak gradient measures 17.1 mmHg. Aortic valve area, by VTI measures 1.73 cm. Pulmonic Valve: The pulmonic valve was not well visualized. Pulmonic valve regurgitation is trivial. No evidence of pulmonic stenosis. Aorta: The aortic root and ascending aorta are structurally normal, with no evidence of dilitation. Venous: The inferior vena cava is normal in size with greater than 50% respiratory variability, suggesting right atrial pressure of 3 mmHg. IAS/Shunts: No atrial level shunt detected by color flow Doppler.  LEFT VENTRICLE PLAX 2D LVIDd:         5.20 cm   Diastology LVIDs:         3.50 cm   LV e' medial:     7.94 cm/s LV PW:         1.15 cm   LV E/e' medial:  16.5 LV IVS:        1.00 cm   LV e' lateral:   7.40 cm/s LVOT diam:     2.00 cm   LV E/e' lateral: 17.7 LV SV:         76 LV SV Index:   47 LVOT Area:     3.14 cm  RIGHT VENTRICLE             IVC RV Basal diam:  3.10 cm     IVC diam: 1.60 cm RV Mid diam:    2.00 cm RV S prime:     12.80 cm/s  PULMONARY VEINS TAPSE (M-mode): 1.8 cm      Diastolic Velocity: 24.80 cm/s                             S/D Velocity:       1.70                             Systolic Velocity:  42.70 cm/s LEFT ATRIUM             Index        RIGHT ATRIUM           Index LA diam:        4.55 cm 2.81 cm/m   RA Area:     16.70 cm LA Vol (A2C):   48.1 ml 29.65 ml/m  RA Volume:   38.70 ml  23.86 ml/m LA Vol (A4C):   82.2 ml 50.68 ml/m LA Biplane Vol: 66.7 ml 41.12 ml/m  AORTIC VALVE AV Area (Vmax):    1.70 cm AV Area (Vmean):   1.76 cm AV Area (VTI):     1.73 cm AV Vmax:           206.50 cm/s AV Vmean:          134.000 cm/s AV VTI:            0.441 m AV Peak Grad:      17.1 mmHg AV Mean Grad:      8.5 mmHg LVOT Vmax:         112.00 cm/s LVOT Vmean:        75.200 cm/s LVOT VTI:          0.244 m LVOT/AV VTI ratio: 0.55  AORTA Ao Root  diam: 3.30 cm Ao Asc diam:  2.90 cm MITRAL VALVE                TRICUSPID VALVE MV Area (PHT): 3.16 cm     TR Peak grad:   29.2 mmHg MV Decel Time: 240 msec     TR Vmax:        270.00 cm/s MV E velocity: 131.00 cm/s MV A velocity: 81.50 cm/s   SHUNTS MV E/A ratio:  1.61         Systemic VTI:  0.24 m                             Systemic Diam: 2.00 cm Joelle Cedars Tonleu Electronically signed by Joelle Cedars Tonleu Signature Date/Time: 08/13/2024/1:20:09 PM    Final    MR BRAIN WO CONTRAST Result Date: 08/13/2024 EXAM: MRI BRAIN WITHOUT CONTRAST 08/13/2024 09:26:18 AM TECHNIQUE: Multiplanar multisequence MRI of the head/brain was performed without the administration of intravenous contrast. COMPARISON: Comparison head CT 08/13/2024. CLINICAL HISTORY: Stroke,  follow up. FINDINGS: The examination is mildly motion degraded. BRAIN AND VENTRICLES: There is a small acute right MCA infarct involving the frontoparietal operculum and insula with trace petechial hemorrhage. There is associated cytotoxic edema without mass effect. Confluent T2 hyperintensities elsewhere in the cerebral white matter bilaterally are nonspecific but compatible with severe chronic small vessel ischemic disease. Mild chronic small vessel changes are present in the pons. A 1 cm partially calcified extra-axial mass is again noted in the right cerebellopontine angle with broad dural base along the tentorium. There is minimal mass effect on the right lateral pons without brain edema. There is mild cerebral atrophy. No midline shift, hydrocephalus, or extra axial fluid collection is evident. Major intracranial vascular flow voids are preserved. ORBITS: Bilateral cataract extraction. SINUSES AND MASTOIDS: No acute abnormality. BONES AND SOFT TISSUES: Normal marrow signal. No acute soft tissue abnormality. IMPRESSION: 1. Small acute right MCA infarct. 2. Severe chronic small vessel ischemic disease. 3. 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 Electronically signed by: Dasie Hamburg MD 08/13/2024 10:20 AM EST RP Workstation: [id number]   CT HEAD WO CONTRAST ( ) Result Date: 08/13/2024 EXAM: CT HEAD WITHOUT CONTRAST 08/13/2024 02:58:11 AM TECHNIQUE: CT of the head was performed without the administration of intravenous contrast. Automated exposure control, iterative reconstruction, and/or weight based adjustment of the mA/kV was utilized to reduce the radiation dose to as low as reasonably achievable. COMPARISON: Comparison with 08/12/2024. CLINICAL HISTORY: Seizure, new-onset, no history of trauma. FINDINGS: BRAIN AND VENTRICLES: No substantial change from prior. Mild hyperdensity over the right MCA territory compatible with contrast staining following neurointerventional  procedure. Advanced chronic ischemic white matter change. Edema with loss of sulcation in the right cerebral hemisphere. No midline shift. Basal cisterns are patent. Unchanged 1 cm partially calcified extra-axial mass in the right cerebellar pontine angle compatible with meningioma. No acute hemorrhage. No hydrocephalus. ORBITS: No acute abnormality. SINUSES: No acute abnormality. SOFT TISSUES AND SKULL: No acute soft tissue abnormality. No skull fracture. IMPRESSION: 1. No change from 12 / 2 / 25 at 8:44 pm. 2. Mild hyperdensity over the right MCA territory compatible with contrast staining following neurointerventional procedure. 3. Edema with loss of sulcation in the right cerebral hemisphere. No midline shift. 4. Unchanged 1 cm partially calcified extra-axial mass in the right cerebellar pontine angle compatible with meningioma. Electronically signed by: Norman Gatlin MD 08/13/2024 03:07 AM EST RP Workstation:  [id number]   CT HEAD WO CONTRAST ( ) Result Date: 08/12/2024 EXAM: CT HEAD WITHOUT CONTRAST 08/12/2024 08:47:00 PM TECHNIQUE: CT of the head was performed without the administration of intravenous contrast. Automated exposure control, iterative reconstruction, and/or weight based adjustment of the mA/kV was utilized to reduce the radiation dose to as low as reasonably achievable. COMPARISON: Head CT performed on 08/12/2024 at 12:37 pm. CLINICAL HISTORY: Neuro deficit, acute, stroke suspected; Worsened NIHSS. FINDINGS: BRAIN AND VENTRICLES: Mild hyperdensity over the right MCA territory likely contrast staining following neurointerventional procedure. There is advanced chronic ischemic white matter change. No acute hemorrhage. No evidence of acute infarct. No hydrocephalus. No extra-axial collection. No mass effect or midline shift. ORBITS: No acute abnormality. SINUSES: No acute abnormality. SOFT TISSUES AND SKULL: No acute soft tissue abnormality. No skull fracture. IMPRESSION: 1. Mild hyperdensity  over the right MCA territory, likely contrast staining following neurointerventional procedure. 2. Advanced chronic ischemic white matter changes. Electronically signed by: Franky Stanford MD 08/12/2024 09:34 PM EST RP Workstation: [id number]   DG Chest Portable 1 View Result Date: 08/12/2024 CLINICAL DATA:  Clemens. EXAM: PORTABLE CHEST 1 VIEW COMPARISON:  09/13/2022 FINDINGS: Increased patient rotation to the right. Enlarged cardiac silhouette with improvement. Clear lungs with normal vascularity. Stable minimal chronic interstitial prominence. Minimally displaced right 6th lateral rib fracture, most likely old. IMPRESSION: 1. Minimally displaced right 6th lateral rib fracture, most likely old. 2. Cardiomegaly with improvement. Electronically Signed   By: Elspeth Bathe M.D.   On: 08/12/2024 16:50   IR PERCUTANEOUS ART THROMBECTOMY/INFUSION INTRACRANIAL INC DIAG ANGIO Result Date: 08/12/2024 PROCEDURE PERFORMED: 1. Cerebral angiography with stroke thrombectomy 2. Ultrasound guided vascular access, right common femoral artery 3. Cone beam CT for treatment planning 4. Ultrasound-guided vascular access, right radial artery COMPARISON:  CT angiogram of the head and neck performed August 12, 2024 CLINICAL DATA:  89 year old female with acute ischemic stroke and NIH stroke scale measuring 8. Symptoms were driven by hemi anopsia. An appropriate informed consent was obtained and the patient was taken for endovascular therapy. INDICATION: Acute ischemic stroke. ANESTHESIA/SEDATION: General anesthesia was utilized for the procedure. CONTRAST:  Approximately 70 cc Ominipque 300 MEDICATIONS: See MAR FLUOROSCOPY TIME:  Fluoroscopy Time: 55 minutes 6 sec, (981 mGy). COMPLICATIONS: None immediate. BODY OF REPORT: Following a full explanation of the procedure along with the potential associated complications, an informed witnessed consent was obtained. The patient was then placed under general anesthesia by the Department of  Anesthesiology at [facility]. The right femoral access site was prepped and draped in the usual sterile fashion. Ultrasound was used to study the right common femoral artery which was patent. Using real-time ultrasound guidance, a 21 gauge introducer needle was used to access the right common femoral artery. Access was performed at 1324. A hard copy image ultrasound the saved and stored in PACS. Using this access, a 6 French sheath was placed in the descending thoracic aorta. Next, selective catheterization the innominate artery was performed and a selective angiogram was performed which demonstrated a severely tortuous arch configuration. Multiple catheters and guidewires were utilized in an exhaustive attempt to gain stable access to the right carotid territory which were unsuccessful. Exchange was made for an 8 French sheath in order to provide improved support. A repeat approach with multiple catheters did not demonstrate significant improvement. At this point, I dedicated my effort to obtaining right radial access in an effort to approach the thrombectomy procedure from a right radial access. Ultrasound was used to study  the right radial artery which was patent. Using real-time ultrasound guidance, a 21 gauge needle was used to gain access to the right radial artery. A wire was placed. Next, a 7 French sheath was placed in the right radial artery. Next, a red 72 catheter was advanced into the right internal carotid artery. A selective angiogram was performed which demonstrated the right M1 occlusion. I was unable to advance the 72 catheter beyond the carotid siphon. Next, multiple microcatheter is were utilized in an effort to gain more distal access which were unsuccessful. This was primary secondary to arterial tortuosity and instability of the access system. At this point, Dr. Todd Burns joined me at my request. Exchange was made for a zoom 88 sheath system. After multiple attempts, he was able to  catheterize the right internal carotid artery and advance this sheath system for more stable access. Next, a catalyst support catheter and Trevo Trak microcatheter were ultimately advanced into the right M2 segment. Next, a 4 mm solitaire was deployed. The first pass was performed at 1525. Recanalization was achieved at this time. This yielded the occlusive thrombus and flow was restored to this segment. There was no evidence of active extravasation or complication. Post treatment TICI score 3. After reviewing the imaging, I elected to terminate the procedure at this point. Evaluation of the right femoral access site demonstrated that the site was suitable for a closure device. A 8 French Angio-Seal device was deployed without complication. A TR band was then deployed at the right radial access site. Cone beam CT was then performed to evaluate for intracranial hemorrhage and treatment planning. This demonstrated no evidence of significant acute intracranial hemorrhage. The patient was removed from anesthesia and transferred to recovery in stable condition. IMPRESSION: 1. Stent retriever assisted thrombectomy of right M1 occlusion with successful restoration of flow to the right MCA territory. PLAN: 1. To recovery for routine postoperative supportive care. Electronically Signed   By: Maude Naegeli M.D.   On: 08/12/2024 16:33   IR US  Guide Vasc Access Right Result Date: 08/12/2024 PROCEDURE PERFORMED: 1. Cerebral angiography with stroke thrombectomy 2. Ultrasound guided vascular access, right common femoral artery 3. Cone beam CT for treatment planning 4. Ultrasound-guided vascular access, right radial artery COMPARISON:  CT angiogram of the head and neck performed August 12, 2024 CLINICAL DATA:  89 year old female with acute ischemic stroke and NIH stroke scale measuring 8. Symptoms were driven by hemi anopsia. An appropriate informed consent was obtained and the patient was taken for endovascular therapy.  INDICATION: Acute ischemic stroke. ANESTHESIA/SEDATION: General anesthesia was utilized for the procedure. CONTRAST:  Approximately 70 cc Ominipque 300 MEDICATIONS: See MAR FLUOROSCOPY TIME:  Fluoroscopy Time: 55 minutes 6 sec, (981 mGy). COMPLICATIONS: None immediate. BODY OF REPORT: Following a full explanation of the procedure along with the potential associated complications, an informed witnessed consent was obtained. The patient was then placed under general anesthesia by the Department of Anesthesiology at [facility]. The right femoral access site was prepped and draped in the usual sterile fashion. Ultrasound was used to study the right common femoral artery which was patent. Using real-time ultrasound guidance, a 21 gauge introducer needle was used to access the right common femoral artery. Access was performed at 1324. A hard copy image ultrasound the saved and stored in PACS. Using this access, a 6 French sheath was placed in the descending thoracic aorta. Next, selective catheterization the innominate artery was performed and a selective angiogram was performed which demonstrated  a severely tortuous arch configuration. Multiple catheters and guidewires were utilized in an exhaustive attempt to gain stable access to the right carotid territory which were unsuccessful. Exchange was made for an 8 French sheath in order to provide improved support. A repeat approach with multiple catheters did not demonstrate significant improvement. At this point, I dedicated my effort to obtaining right radial access in an effort to approach the thrombectomy procedure from a right radial access. Ultrasound was used to study the right radial artery which was patent. Using real-time ultrasound guidance, a 21 gauge needle was used to gain access to the right radial artery. A wire was placed. Next, a 7 French sheath was placed in the right radial artery. Next, a red 72 catheter was advanced into the right internal  carotid artery. A selective angiogram was performed which demonstrated the right M1 occlusion. I was unable to advance the 72 catheter beyond the carotid siphon. Next, multiple microcatheter is were utilized in an effort to gain more distal access which were unsuccessful. This was primary secondary to arterial tortuosity and instability of the access system. At this point, Dr. Todd Burns joined me at my request. Exchange was made for a zoom 88 sheath system. After multiple attempts, he was able to catheterize the right internal carotid artery and advance this sheath system for more stable access. Next, a catalyst support catheter and Trevo Trak microcatheter were ultimately advanced into the right M2 segment. Next, a 4 mm solitaire was deployed. The first pass was performed at 1525. Recanalization was achieved at this time. This yielded the occlusive thrombus and flow was restored to this segment. There was no evidence of active extravasation or complication. Post treatment TICI score 3. After reviewing the imaging, I elected to terminate the procedure at this point. Evaluation of the right femoral access site demonstrated that the site was suitable for a closure device. A 8 French Angio-Seal device was deployed without complication. A TR band was then deployed at the right radial access site. Cone beam CT was then performed to evaluate for intracranial hemorrhage and treatment planning. This demonstrated no evidence of significant acute intracranial hemorrhage. The patient was removed from anesthesia and transferred to recovery in stable condition. IMPRESSION: 1. Stent retriever assisted thrombectomy of right M1 occlusion with successful restoration of flow to the right MCA territory. PLAN: 1. To recovery for routine postoperative supportive care. Electronically Signed   By: Maude Naegeli M.D.   On: 08/12/2024 16:33   IR CT Head Ltd Result Date: 08/12/2024 PROCEDURE PERFORMED: 1. Cerebral angiography with stroke  thrombectomy 2. Ultrasound guided vascular access, right common femoral artery 3. Cone beam CT for treatment planning 4. Ultrasound-guided vascular access, right radial artery COMPARISON:  CT angiogram of the head and neck performed August 12, 2024 CLINICAL DATA:  89 year old female with acute ischemic stroke and NIH stroke scale measuring 8. Symptoms were driven by hemi anopsia. An appropriate informed consent was obtained and the patient was taken for endovascular therapy. INDICATION: Acute ischemic stroke. ANESTHESIA/SEDATION: General anesthesia was utilized for the procedure. CONTRAST:  Approximately 70 cc Ominipque 300 MEDICATIONS: See MAR FLUOROSCOPY TIME:  Fluoroscopy Time: 55 minutes 6 sec, (981 mGy). COMPLICATIONS: None immediate. BODY OF REPORT: Following a full explanation of the procedure along with the potential associated complications, an informed witnessed consent was obtained. The patient was then placed under general anesthesia by the Department of Anesthesiology at [facility]. The right femoral access site was prepped and draped in the  usual sterile fashion. Ultrasound was used to study the right common femoral artery which was patent. Using real-time ultrasound guidance, a 21 gauge introducer needle was used to access the right common femoral artery. Access was performed at 1324. A hard copy image ultrasound the saved and stored in PACS. Using this access, a 6 French sheath was placed in the descending thoracic aorta. Next, selective catheterization the innominate artery was performed and a selective angiogram was performed which demonstrated a severely tortuous arch configuration. Multiple catheters and guidewires were utilized in an exhaustive attempt to gain stable access to the right carotid territory which were unsuccessful. Exchange was made for an 8 French sheath in order to provide improved support. A repeat approach with multiple catheters did not demonstrate significant  improvement. At this point, I dedicated my effort to obtaining right radial access in an effort to approach the thrombectomy procedure from a right radial access. Ultrasound was used to study the right radial artery which was patent. Using real-time ultrasound guidance, a 21 gauge needle was used to gain access to the right radial artery. A wire was placed. Next, a 7 French sheath was placed in the right radial artery. Next, a red 72 catheter was advanced into the right internal carotid artery. A selective angiogram was performed which demonstrated the right M1 occlusion. I was unable to advance the 72 catheter beyond the carotid siphon. Next, multiple microcatheter is were utilized in an effort to gain more distal access which were unsuccessful. This was primary secondary to arterial tortuosity and instability of the access system. At this point, Dr. Todd Burns joined me at my request. Exchange was made for a zoom 88 sheath system. After multiple attempts, he was able to catheterize the right internal carotid artery and advance this sheath system for more stable access. Next, a catalyst support catheter and Trevo Trak microcatheter were ultimately advanced into the right M2 segment. Next, a 4 mm solitaire was deployed. The first pass was performed at 1525. Recanalization was achieved at this time. This yielded the occlusive thrombus and flow was restored to this segment. There was no evidence of active extravasation or complication. Post treatment TICI score 3. After reviewing the imaging, I elected to terminate the procedure at this point. Evaluation of the right femoral access site demonstrated that the site was suitable for a closure device. A 8 French Angio-Seal device was deployed without complication. A TR band was then deployed at the right radial access site. Cone beam CT was then performed to evaluate for intracranial hemorrhage and treatment planning. This demonstrated no evidence of significant acute  intracranial hemorrhage. The patient was removed from anesthesia and transferred to recovery in stable condition. IMPRESSION: 1. Stent retriever assisted thrombectomy of right M1 occlusion with successful restoration of flow to the right MCA territory. PLAN: 1. To recovery for routine postoperative supportive care. Electronically Signed   By: Maude Naegeli M.D.   On: 08/12/2024 16:33   IR US  Guide Vasc Access Right Result Date: 08/12/2024 PROCEDURE PERFORMED: 1. Cerebral angiography with stroke thrombectomy 2. Ultrasound guided vascular access, right common femoral artery 3. Cone beam CT for treatment planning 4. Ultrasound-guided vascular access, right radial artery COMPARISON:  CT angiogram of the head and neck performed August 12, 2024 CLINICAL DATA:  89 year old female with acute ischemic stroke and NIH stroke scale measuring 8. Symptoms were driven by hemi anopsia. An appropriate informed consent was obtained and the patient was taken for endovascular therapy. INDICATION: Acute ischemic stroke.  ANESTHESIA/SEDATION: General anesthesia was utilized for the procedure. CONTRAST:  Approximately 70 cc Ominipque 300 MEDICATIONS: See MAR FLUOROSCOPY TIME:  Fluoroscopy Time: 55 minutes 6 sec, (981 mGy). COMPLICATIONS: None immediate. BODY OF REPORT: Following a full explanation of the procedure along with the potential associated complications, an informed witnessed consent was obtained. The patient was then placed under general anesthesia by the Department of Anesthesiology at [facility]. The right femoral access site was prepped and draped in the usual sterile fashion. Ultrasound was used to study the right common femoral artery which was patent. Using real-time ultrasound guidance, a 21 gauge introducer needle was used to access the right common femoral artery. Access was performed at 1324. A hard copy image ultrasound the saved and stored in PACS. Using this access, a 6 French sheath was placed in the  descending thoracic aorta. Next, selective catheterization the innominate artery was performed and a selective angiogram was performed which demonstrated a severely tortuous arch configuration. Multiple catheters and guidewires were utilized in an exhaustive attempt to gain stable access to the right carotid territory which were unsuccessful. Exchange was made for an 8 French sheath in order to provide improved support. A repeat approach with multiple catheters did not demonstrate significant improvement. At this point, I dedicated my effort to obtaining right radial access in an effort to approach the thrombectomy procedure from a right radial access. Ultrasound was used to study the right radial artery which was patent. Using real-time ultrasound guidance, a 21 gauge needle was used to gain access to the right radial artery. A wire was placed. Next, a 7 French sheath was placed in the right radial artery. Next, a red 72 catheter was advanced into the right internal carotid artery. A selective angiogram was performed which demonstrated the right M1 occlusion. I was unable to advance the 72 catheter beyond the carotid siphon. Next, multiple microcatheter is were utilized in an effort to gain more distal access which were unsuccessful. This was primary secondary to arterial tortuosity and instability of the access system. At this point, Dr. Todd Burns joined me at my request. Exchange was made for a zoom 88 sheath system. After multiple attempts, he was able to catheterize the right internal carotid artery and advance this sheath system for more stable access. Next, a catalyst support catheter and Trevo Trak microcatheter were ultimately advanced into the right M2 segment. Next, a 4 mm solitaire was deployed. The first pass was performed at 1525. Recanalization was achieved at this time. This yielded the occlusive thrombus and flow was restored to this segment. There was no evidence of active extravasation or  complication. Post treatment TICI score 3. After reviewing the imaging, I elected to terminate the procedure at this point. Evaluation of the right femoral access site demonstrated that the site was suitable for a closure device. A 8 French Angio-Seal device was deployed without complication. A TR band was then deployed at the right radial access site. Cone beam CT was then performed to evaluate for intracranial hemorrhage and treatment planning. This demonstrated no evidence of significant acute intracranial hemorrhage. The patient was removed from anesthesia and transferred to recovery in stable condition. IMPRESSION: 1. Stent retriever assisted thrombectomy of right M1 occlusion with successful restoration of flow to the right MCA territory. PLAN: 1. To recovery for routine postoperative supportive care. Electronically Signed   By: Maude Naegeli M.D.   On: 08/12/2024 16:33   CT C-SPINE NO CHARGE Result Date: 08/12/2024 EXAM: CT CERVICAL  SPINE WITHOUT CONTRAST 08/12/2024 01:44:26 PM TECHNIQUE: CT of the cervical spine was performed without the administration of intravenous contrast. Multiplanar reformatted images are provided for review. Automated exposure control, iterative reconstruction, and/or weight based adjustment of the mA/kV was utilized to reduce the radiation dose to as low as reasonably achievable. COMPARISON: None available. CLINICAL HISTORY: Left sided weakness. FINDINGS: CERVICAL SPINE: BONES AND ALIGNMENT: Mild right convex curvature of the cervical spine. Degenerative appearing grade 1 anterolisthesis of C3 on C4 and C4 on C5. No acute fracture or suspicious lesion. DEGENERATIVE CHANGES: Severe disc space narrowing and degenerative endplate changes at C4-C5 and C5-C6 with moderate narrowing at C3-C4. Moderately advanced facet arthrosis on the left at C2-C3 and C3-C4 and bilaterally at C4-C5. No evidence of high grade stenosis. SOFT TISSUES: No prevertebral soft tissue swelling. IMPRESSION: 1. No  acute osseous abnormality. 2. Multilevel disc and facet degeneration without evidence of high-grade stenosis. Electronically signed by: Dasie Hamburg MD 08/12/2024 02:54 PM EST RP Workstation: [id number]   CT ANGIO HEAD NECK W WO CM W PERF (CODE STROKE) Result Date: 08/12/2024 EXAM: CTA Head and Neck with Perfusion 08/12/2024 01:44:26 PM TECHNIQUE: CTA of the head and neck was performed with the administration of intravenous contrast. 3D postprocessing with multiplanar reconstructions and MIPs was performed to evaluate the vascular anatomy. Cerebral perfusion analysis using computed tomography with contrast administration, including post-processing of parametric maps with determination of cerebral blood flow, cerebral blood volume, mean transit time and time-to-maximum. Automated exposure control, iterative reconstruction, and/or weight based adjustment of the mA/kV was utilized to reduce the radiation dose to as low as reasonably achievable. COMPARISON: None available CLINICAL HISTORY: Neuro deficit, acute, stroke suspected. FINDINGS: CTA NECK: AORTIC ARCH AND ARCH VESSELS: Calcified plaque in the aortic arch without a significant stenosis of the arch vessel origins. No dissection or arterial injury. CERVICAL CAROTID ARTERIES: Calcified plaque about the left greater than right carotid bifurcations. Tortuous proximal ICAs with a retropharyngeal course on the right. No dissection, arterial injury, or hemodynamically significant stenosis by NASCET criteria. CERVICAL VERTEBRAL ARTERIES: Dominant right vertebral artery. No dissection, arterial injury, or significant stenosis. LUNGS AND MEDIASTINUM: Mild biapical lung scarring. Enlarged main pulmonary artery measuring 3.9 cm in diameter which can be seen with pulmonary arterial hypertension. SOFT TISSUES: No acute abnormality. BONES: Cervical spine CT reported separately. CTA HEAD: ANTERIOR CIRCULATION: The intracranial internal carotid arteries are patent with mild  atherosclerosis not resulting in significant stenosis. The right M1 segment is widely patent proximally, however at the bifurcation there is occlusion of the right M2 inferior division at its origin with moderate distal collateralization. The left MCA and both ACAs are patent without evidence of a significant proximal stenosis. No aneurysm. POSTERIOR CIRCULATION: The intracranial vertebral arteries are patent to the basilar with mild atherosclerosis not resulting in a significant stenosis. Patent PICA, AICA, and SCA origins are visualized bilaterally. There are moderately large right and diminutive or absent left posterior communicating arteries. Both PCAs are patent without evidence of a significant proximal stenosis. No aneurysm. OTHER: No dural venous sinus thrombosis on this non-dedicated study. CT PERFUSION: EXAM QUALITY: Exam quality is adequate with diagnostic perfusion maps. No significant motion artifact. Appropriate arterial inflow and venous outflow curves. CORE INFARCT (CBF<30% volume): 0 mL TOTAL HYPOPERFUSION (Tmax>6s volume): 43 mL PENUMBRA: Mismatch volume: 43 mL Mismatch ratio: not applicable Location: right MCA territory Emergent results were discussed by telephone with Dr. CANDIE Mari at 12:55PM on 08/12/2024. IMPRESSION: 1. Occlusion of the right M2 inferior  division at its origin. 2. Right MCA penumbra, with the volume being slightly overestimated as the small core infarct visible on CT was not detected by perfusion processing. 3. Mild atherosclerosis elsewhere in the head and neck without a significant stenosis. Electronically signed by: Dasie Hamburg MD 08/12/2024 02:50 PM EST RP Workstation: [id number]   CT HEAD CODE STROKE WO CONTRAST Result Date: 08/12/2024 EXAM: CT HEAD WITHOUT CONTRAST TECHNIQUE: CT of the head was performed without the administration of intravenous contrast. Automated exposure control, iterative reconstruction, and/or weight based adjustment of the mA/kV was utilized  to reduce the radiation dose to as low as reasonably achievable. COMPARISON: None available. CLINICAL HISTORY: Neuro deficit, acute, stroke suspected FINDINGS: BRAIN AND VENTRICLES: There is a small area of cortical hypodensity involving the inferior right frontoparietal operculum consistent with an acute infarct. The right MCA is hyperdense. No intracranial hemorrhage, midline shift, hydrocephalus, or extra-axial fluid collection is identified. Mild cerebral atrophy is within normal limits for age. Confluent hypodensities in the cerebral white matter bilaterally are nonspecific but compatible with severe chronic small vessel ischemic disease. There is an approximately 1 cm partially calcified extra-axial mass in the right cerebellopontine angle cistern which has a broad dural base with the right tentorial leaflet. There is at most minimal mass effect on the right lateral pons. ORBITS: No acute abnormality. SINUSES: No acute abnormality. SOFT TISSUES AND SKULL: No acute soft tissue abnormality. No skull fracture. [place] Stroke Program Early CT Score (ASPECTS) ----- Ganglionic (caudate, IC, lentiform nucleus, insula, M1-M3): 6 Supraganglionic (M4-M6): 3 Total: 9 IMPRESSION: 1. Acute right MCA infarct involving the frontoparietal operculum. No hemorrhage. ASPECTS of 9. 2. Severe chronic small vessel ischemic disease. 3. 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 4. Emergent results were discussed by telephone with Dr. CANDIE Mari at 12:55 PM on 08/12/2024. Electronically signed by: Dasie Hamburg MD 08/12/2024 01:03 PM EST RP Workstation: [id number]   Medications:  clevidipine Stopped (08/13/24 0116)   sodium chloride  Stopped (08/14/24 0243)   valproate sodium 300 mg (08/14/24 0601)    aspirin EC  81 mg Oral Daily   Or   aspirin  300 mg Rectal Daily   atorvastatin   40 mg Oral Daily   Chlorhexidine  Gluconate Cloth  6 each Topical Q0600   Chlorhexidine  Gluconate  Cloth  6 each Topical Q0600   doxercalciferol  6 mcg Intravenous Q T,Th,Sa-HD   heparin   5,000 Units Subcutaneous Q8H   insulin  aspart  0-6 Units Subcutaneous Q4H   levothyroxine   75 mcg Oral QAC breakfast   sodium bicarbonate   650 mg Oral BID   sodium chloride  flush  3 mL Intravenous Once   Dialysis Orders:  AF MWF 3:15 350/600 EDW 57.5 kg 2K/2Ca AVF 16g Heparin  3000 +2000 midrun No ESA orders. Venofer 50 q 2k Hectorol 6 q HD, sevelamer 2 q ac    Assessment/Plan: Acute CVA. R MCA infarct s/p IR thrombectomy. C/b new seizures.  Management per neuro.  2.  ESRD.  HD MWF. Missed Monday d/t illness. Had HD 12/3.  HD next tomorrow.  3.  Volume. No gross volume on exam. UF as able. Currently NPO - not needing much UF.  4.  Anemia.Hb down trending.   Check iron.  Resume ESA.    5.  Metabolic bone disease.  Follow Ca/Phos trend. Resume home meds when eating  6.  Nutrition. Currently NPO.  After discussion with neurology pt now DNR/DNI per [facility] daughter.  [name] MD 08/14/2024, 8:35 AM  [facility] Pager: [phone]

## 2024-08-14 NOTE — Progress Notes (Signed)
 30d monitor ordered for stroke. Follow up arranged.

## 2024-08-14 NOTE — Evaluation (Signed)
 Occupational Therapy Evaluation Patient Details Name: Norma Coleman MRN: [id number] DOB: Jan 30, 1933 Today's Date: 08/14/2024   History of Present Illness   89 yo female presents with L side weakness, L hemianopsia and dysarthria after falling out of bed and striking her head. NIH 10 CT (+) R MCA occlusion, R cerebellopontine mass compatible with meningioma, R MCA infarct involving frontoparietal operculum.  12/2 R M1 thrombectomy TICI 3. Complicated by new onset seizure activity, activity localizes to the Left hemisphere. MRI 12/3 also showed L parietal lobe and R frontal acute infarct PMH includes: ESRD on HD, HTN, DM2, CHF, DVT.     Clinical Impressions Patient admitted for above and presents with problem list below.  She is stuporous during evaluation, blinks eyes open to name and loud sound but otherwise keeping eyes closed throughout session.  With stimulation, she is able to follow a few simple commands with R UE (squeeze hand, push) but L UE appears to have some extensor tone?Norma Coleman She is dependent with ADLs/mobility at this time, has R lateral lean in chair position in bed.  Will follow acutely, and update plan as needed.  Currently recommend long term care without follow up OT.       If plan is discharge home, recommend the following:   Two people to help with walking and/or transfers;Two people to help with bathing/dressing/bathroom;Other (comment) (total care)     Functional Status Assessment         Equipment Recommendations   Hoyer lift;Hospital bed;Wheelchair (measurements OT);Wheelchair cushion (measurements OT);BSC/3in1     Recommendations for Other Services   Speech consult     Precautions/Restrictions   Precautions Precautions: Fall Recall of Precautions/Restrictions: Impaired     Mobility Bed Mobility Overal bed mobility: Needs Assistance             General bed mobility comments: dependently elevated to chair position via bed; brought torso  forward into unsupported sitting with no righting reactions noted    Transfers                   General transfer comment: deferred      Balance Overall balance assessment: Needs assistance Sitting-balance support: Bilateral upper extremity supported, Feet unsupported Sitting balance-Leahy Scale: Zero Sitting balance - Comments: supported sitting leans to rt; unsupported sitting no righting reactions noted                                   ADL either performed or assessed with clinical judgement   ADL Overall ADL's : Needs assistance/impaired                                       General ADL Comments: total assist     Vision   Vision Assessment?: Vision impaired- to be further tested in functional context Additional Comments: blinks eyes to loud sound, needing assisted eyes open and unable to sustain. continue assessment as able.     Perception         Praxis         Pertinent Vitals/Pain Pain Assessment Pain Assessment: CPOT Facial Expression: Relaxed, neutral Body Movements: Absence of movements Muscle Tension: Relaxed Compliance with ventilator (intubated pts.): N/A Vocalization (extubated pts.): N/A CPOT Total: 0     Extremity/Trunk Assessment Upper Extremity Assessment Upper Extremity Assessment: RUE deficits/detail;LUE deficits/detail RUE  Deficits / Details: PROM WFL, after stimulation pt able to squeeze hand and push.  Grossly 2/5 but not using UE functionally.  Withdrawls to noxious stimuli. RUE Sensation: decreased light touch;decreased proprioception RUE Coordination: decreased fine motor;decreased gross motor LUE Deficits / Details: PROM WFL, after stimulation pt with extensor tone noted in elbow and shoulder. Grossly 0/5 MMT, not using UE functionally. Withdrawls to noxious stimuli. LUE Sensation: decreased light touch;decreased proprioception LUE Coordination: decreased fine motor;decreased gross motor    Lower Extremity Assessment Lower Extremity Assessment: Defer to PT evaluation RLE Deficits / Details: no AROM noted; in chair position passively extend Rt knee and pt tries to hold it up with slow, controlled lowering to bed; no withdrawal to pain LLE Deficits / Details: no AROM noted; in chair position passively extend Lt knee and pt tries to hold it up with uncontrolled drop of leg to bed; no withdrawal to pain   Cervical / Trunk Assessment Cervical / Trunk Assessment: Other exceptions Cervical / Trunk Exceptions: leans to right in sitting   Communication Communication Communication: Impaired Factors Affecting Communication: Difficulty expressing self;Other (comment) (decr level of arousal)   Cognition Arousal: Stuporous Behavior During Therapy: Flat affect Cognition: Difficult to assess Difficult to assess due to: Level of arousal           OT - Cognition Comments: pt able to voice good morning when awakened, she verbalizes her birthday with cueing but otherwise does not maintain alertness.  She follows some simple commands with R UE but not L UE. Blinks eyes to loud noise but does not maintain eyes open.                 Following commands: Impaired Following commands impaired: Follows one step commands inconsistently, Follows one step commands with increased time     Cueing  General Comments   Cueing Techniques: Verbal cues;Tactile cues  HR max 127, BP stable with changes in position; on 2L   Exercises     Shoulder Instructions      Home Living Family/patient expects to be discharged to:: Unsure                                        Prior Functioning/Environment Prior Level of Function : Patient poor historian/Family not available                    OT Problem List: Decreased strength;Decreased activity tolerance;Decreased range of motion;Impaired balance (sitting and/or standing);Impaired vision/perception;Decreased  coordination;Decreased cognition;Decreased safety awareness;Decreased knowledge of use of DME or AE;Decreased knowledge of precautions;Impaired sensation;Impaired tone;Impaired UE functional use   OT Treatment/Interventions: Self-care/ADL training;DME and/or AE instruction;Therapeutic activities;Cognitive remediation/compensation;Patient/family education;Visual/perceptual remediation/compensation;Balance training;Splinting;Manual therapy;Neuromuscular education      OT Goals(Current goals can be found in the care plan section)   Acute Rehab OT Goals Patient Stated Goal: unable OT Goal Formulation: Patient unable to participate in goal setting Time For Goal Achievement: 08/28/24 Potential to Achieve Goals: Fair   OT Frequency:  Min 2X/week    Co-evaluation PT/OT/SLP Co-Evaluation/Treatment: Yes Reason for Co-Treatment: Complexity of the patient's impairments (multi-system involvement);To address functional/ADL transfers PT goals addressed during session: Balance;Strengthening/ROM OT goals addressed during session: ADL's and self-care;Strengthening/ROM      AM-PAC OT 6 Clicks Daily Activity     Outcome Measure Help from another person eating meals?: Total Help from another person taking care of personal grooming?:  Total Help from another person toileting, which includes using toliet, bedpan, or urinal?: Total Help from another person bathing (including washing, rinsing, drying)?: Total Help from another person to put on and taking off regular upper body clothing?: Total Help from another person to put on and taking off regular lower body clothing?: Total 6 Click Score: 6   End of Session Equipment Utilized During Treatment: Oxygen (2L) Nurse Communication: Mobility status;Precautions  Activity Tolerance: Patient limited by lethargy Patient left: in bed;with call bell/phone within reach;with bed alarm set  OT Visit Diagnosis: Other abnormalities of gait and mobility  (R26.89);Hemiplegia and hemiparesis;Cognitive communication deficit (R41.841);Other symptoms and signs involving cognitive function;Other symptoms and signs involving the nervous system (R29.898);Muscle weakness (generalized) (M62.81) Symptoms and signs involving cognitive functions: Cerebral infarction Hemiplegia - Right/Left: Left Hemiplegia - caused by: Cerebral infarction                Time: 9183-9164 OT Time Calculation (min): 19 min Charges:  OT General Charges $OT Visit: 1 Visit OT Evaluation $OT Eval Moderate Complexity: 1 Mod  Etta NOVAK, OT Acute Rehabilitation Services Office [phone] Secure Chat Preferred    Etta GORMAN Hope 08/14/2024, 9:28 AM

## 2024-08-14 NOTE — Evaluation (Signed)
 Clinical/Bedside Swallow Evaluation Patient Details  Name: Norma Coleman MRN: [id number] Date of Birth: 01-23-1933  Today's Date: 08/14/2024 Time: SLP Start Time (ACUTE ONLY): 9073 SLP Stop Time (ACUTE ONLY): 0945 SLP Time Calculation (min) (ACUTE ONLY): 19 min  Past Medical History:  Past Medical History:  Diagnosis Date   DVT (deep venous thrombosis) (HCC)    GI bleed due to NSAIDs    High cholesterol    Hypertension    Hyperthyroidism    Pneumonia    Skin cancer    Thyroid  disease    Past Surgical History:  Past Surgical History:  Procedure Laterality Date   A/V SHUNT INTERVENTION Left 05/08/2024   Procedure: A/V SHUNT INTERVENTION;  Surgeon: Sheree Penne Bruckner, MD;  Location: [facility];  Service: Cardiovascular;  Laterality: Left;   IR CT HEAD LTD  08/12/2024   IR FLUORO GUIDE CV LINE RIGHT  09/18/2022   IR PERCUTANEOUS ART THROMBECTOMY/INFUSION INTRACRANIAL INC DIAG ANGIO  08/12/2024   IR US  GUIDE VASC ACCESS RIGHT  09/18/2022   IR US  GUIDE VASC ACCESS RIGHT  08/12/2024   IR US  GUIDE VASC ACCESS RIGHT  08/12/2024   KNEE SURGERY     RADIOLOGY WITH ANESTHESIA N/A 08/12/2024   Procedure: RADIOLOGY WITH ANESTHESIA;  Surgeon: Radiologist, Medication, MD;  Location: [facility];  Service: Radiology;  Laterality: N/A;   TONSILLECTOMY     TUBAL LIGATION     VENOUS ANGIOPLASTY  05/08/2024   Procedure: VENOUS ANGIOPLASTY;  Surgeon: Sheree Penne Bruckner, MD;  Location: [facility];  Service: Cardiovascular;;   HPI:  89 yo female presents with L side weakness, L hemianopsia and dysarthria after falling out of bed and striking her head. NIH 10 CT (+) R MCA occlusion, R cerebellopontine mass compatible with meningioma, R MCA infarct involving frontoparietal operculum.  12/2 R M1 thrombectomy TICI 3. Complicated by new onset seizure activity, activity localizes to the Left hemisphere. MRI 12/3 also showed L parietal lobe and R frontal acute infarct PMH includes: ESRD on HD,  HTN, DM2, CHF, DVT.    Assessment / Plan / Recommendation  Clinical Impression  PLAN: Start dysphagia 1 diet; thin liquids; crush meds.  Lethargy is the primary obstacle to safe and sufficient PO intake.    Pt was lethargic after busy morning.  She required max prompts to open eyes, answer questions, and participate in assessment. During brief moments of arousal she accepted ice chips, sips of water from a straw, and bites of applesauce with adequate attention, manipulation, and palpable swallow. There were no overt s/s of aspiration. D/W RN after eval - recommend starting diet as above. Provide careful hand-feeding when awake. SLP will follow for readiness to advance diet and for cognitive/language evaluation as mental status permits.   SLP Visit Diagnosis: Dysphagia, unspecified (R13.10)    Aspiration Risk       Diet Recommendation   Thin;Dysphagia 1 (puree)  Medication Administration: Crushed with puree    Other Recommendations Oral Care Recommendations: Oral care BID     Swallow Evaluation Recommendations     Assistance Recommended at Discharge    Functional Status Assessment Patient has had a recent decline in their functional status and demonstrates the ability to make significant improvements in function in a reasonable and predictable amount of time.  Frequency and Duration min 2x/week  2 weeks       Prognosis Prognosis for improved oropharyngeal function: Good      Swallow Study   General Date of Onset:  08/13/24 HPI: 89 yo female presents with L side weakness, L hemianopsia and dysarthria after falling out of bed and striking her head. NIH 10 CT (+) R MCA occlusion, R cerebellopontine mass compatible with meningioma, R MCA infarct involving frontoparietal operculum.  12/2 R M1 thrombectomy TICI 3. Complicated by new onset seizure activity, activity localizes to the Left hemisphere. MRI 12/3 also showed L parietal lobe and R frontal acute infarct PMH includes: ESRD on HD,  HTN, DM2, CHF, DVT. Type of Study: Bedside Swallow Evaluation Previous Swallow Assessment: no Diet Prior to this Study: NPO Temperature Spikes Noted: No Respiratory Status: Nasal cannula History of Recent Intubation: No Behavior/Cognition: Lethargic/Drowsy Oral Cavity Assessment: Within Functional Limits Oral Care Completed by SLP: Recent completion by staff Oral Cavity - Dentition: Missing dentition Self-Feeding Abilities: Total assist Patient Positioning: Upright in bed Baseline Vocal Quality: Low vocal intensity Volitional Cough: Cognitively unable to elicit Volitional Swallow: Unable to elicit    Oral/Motor/Sensory Function Overall Oral Motor/Sensory Function: Within functional limits   Ice Chips Ice chips: Within functional limits   Thin Liquid Thin Liquid: Within functional limits    Nectar Thick Nectar Thick Liquid: Not tested   Honey Thick Honey Thick Liquid: Not tested   Puree Puree: Within functional limits   Solid     Solid: Not tested      Vona Palma Laurice 08/14/2024,9:52 AM  [name], MA CCC/SLP Clinical Specialist - Acute Care SLP Acute Rehabilitation Services Office number [phone]

## 2024-08-14 NOTE — Procedures (Signed)
 Patient Name: Norma Coleman  MRN: [id number]  Epilepsy Attending: Arlin MALVA Krebs  Referring Physician/Provider: Merrianne Locus, MD  Duration: 08/13/2024 9165 to 08/13/2024 2329  Patient history: 89 y.o. female who presents with acute onset left-sided weakness, left hemianopsia and dysarthria. EEG to evaluate for seizure  Level of alertness: Awake, asleep  AEDs during EEG study: VPA  Technical aspects: This EEG study was done with scalp electrodes positioned according to the 10-20 International system of electrode placement. Electrical activity was reviewed with band pass filter of 1-70Hz , sensitivity of 7 uV/mm, display speed of 8mm/sec with a 60Hz  notched filter applied as appropriate. EEG data were recorded continuously and digitally stored.  Video monitoring was available and reviewed as appropriate.  Description: During awake state, no clear posterior dominant rhythm was seen. Sleep was characterized by sleep spindles (12 to 14 Hz), maximal frontocentral region.  EEG showed continuous disorganized 6 to 9 Hz theta- alpha activity in left hemisphere as well as continuous 3 to 5 Hz theta-delta slowing in right hemisphere. Hyperventilation and photic stimulation were not performed.      ABNORMALITY - Continuous slow, generalized and lateralized right hemisphere    IMPRESSION: This study is suggestive of cortical dysfunction in right hemisphere likely secondary to underlying structural abnormality. Additionally is generalized cerebral dysfunction (encephalopathy). No seizures or definite epileptiform discharges were seen throughout the recording.   Jaxxson Cavanah O Mataio Mele

## 2024-08-14 NOTE — Progress Notes (Addendum)
 STROKE TEAM PROGRESS NOTE    SIGNIFICANT HOSPITAL EVENTS 12/2 presented with acute left side weakness. NO TNK due to outside window, CTA with R MCA occlusion, taken for mechanical thrombectomy   INTERIM HISTORY/SUBJECTIVE No family at the bedside.  Overnight events noted.  LTM with continuous generalized slowing in right hemisphere, will discontinue LTM today  Patient's mental status is much improved today she is awake and oriented to self age and hospital she follows commands.  She has a left facial droop left hemianopia and a drift in left arm and leg Repeat CT yesterday raise concerns about cytotoxic edema in the right MCA territory suggesting right MCA reocclusion however MRI scan showed stable appearance of the right MCA infarct without significant cytotoxic edema and MRA brain showed patent right MCA VPA 52 this morning Will transfer her out of the ICU today  CBC    Component Value Date/Time   WBC 9.9 08/14/2024 0401   RBC 2.46 (L) 08/14/2024 0401   HGB 7.7 (L) 08/14/2024 0401   HCT 23.8 (L) 08/14/2024 0401   PLT 177 08/14/2024 0401   MCV 96.7 08/14/2024 0401   MCH 31.3 08/14/2024 0401   MCHC 32.4 08/14/2024 0401   RDW 14.8 08/14/2024 0401   LYMPHSABS 0.8 08/12/2024 1233   MONOABS 0.5 08/12/2024 1233   EOSABS 0.0 08/12/2024 1233   BASOSABS 0.0 08/12/2024 1233    BMET    Component Value Date/Time   NA 132 (L) 08/14/2024 0401   K 4.3 08/14/2024 0401   CL 95 (L) 08/14/2024 0401   CO2 24 08/14/2024 0401   GLUCOSE 77 08/14/2024 0401   BUN 31 (H) 08/14/2024 0401   CREATININE 5.66 (H) 08/14/2024 0401   CALCIUM  8.3 (L) 08/14/2024 0401   GFRNONAA 7 (L) 08/14/2024 0401    IMAGING past 24 hours Overnight EEG with video Result Date: 08/14/2024 Shelton Arlin KIDD, MD     08/14/2024  9:03 AM Patient Name: Norma Coleman MRN: [id number] Epilepsy Attending: Arlin KIDD Shelton Referring Physician/Provider: Merrianne Locus, MD Duration: 08/13/2024 0834 to 08/13/2024 2329 Patient  history: 89 y.o. female who presents with acute onset left-sided weakness, left hemianopsia and dysarthria. EEG to evaluate for seizure Level of alertness: Awake, asleep AEDs during EEG study: VPA Technical aspects: This EEG study was done with scalp electrodes positioned according to the 10-20 International system of electrode placement. Electrical activity was reviewed with band pass filter of 1-70Hz , sensitivity of 7 uV/mm, display speed of 34mm/sec with a 60Hz  notched filter applied as appropriate. EEG data were recorded continuously and digitally stored.  Video monitoring was available and reviewed as appropriate. Description: During awake state, no clear posterior dominant rhythm was seen. Sleep was characterized by sleep spindles (12 to 14 Hz), maximal frontocentral region.  EEG showed continuous disorganized 6 to 9 Hz theta- alpha activity in left hemisphere as well as continuous 3 to 5 Hz theta-delta slowing in right hemisphere. Hyperventilation and photic stimulation were not performed.    ABNORMALITY - Continuous slow, generalized and lateralized right hemisphere   IMPRESSION: This study is suggestive of cortical dysfunction in right hemisphere likely secondary to underlying structural abnormality. Additionally is generalized cerebral dysfunction (encephalopathy). No seizures or definite epileptiform discharges were seen throughout the recording.  Arlin KIDD Shelton   MR BRAIN WO CONTRAST Result Date: 08/13/2024 EXAM: MR Angiography Head without intravenous Contrast. 08/13/2024 09:34:24 PM TECHNIQUE: Magnetic resonance angiography images of the head without intravenous contrast. Multiplanar 2D and 3D reformatted images are provided for  review. COMPARISON: None provided. CLINICAL HISTORY: Neuro deficit, acute, stroke suspected; limited diffusion and flair imaging. FINDINGS: BRAIN PARENCHYMA: Intermediate-sized acute/early subacute infarct of the right frontal operculum and insula. Scattered punctate foci  of acute ischemia in the left parietal lobe and right frontal white matter. Early confluent hyperintense T2-weighted signal within the cerebral white matter, most commonly due to chronic small vessel disease. Prominent. ANTERIOR CIRCULATION: No significant stenosis of the internal carotid arteries. No significant stenosis of the anterior cerebral arteries. No significant stenosis of the middle cerebral arteries. No aneurysm. POSTERIOR CIRCULATION: No significant stenosis of the posterior cerebral arteries. No significant stenosis of the basilar artery. No significant stenosis of the vertebral arteries. No aneurysm. IMPRESSION: 1. Intermediate-sized acute/early subacute infarct of the right frontal operculum and insula. 2. Scattered punctate foci of acute ischemia in the left parietal lobe and right frontal white matter. 3. Early confluent hyperintense T2-weighted signal within the cerebral white matter, most commonly due to chronic small vessel disease. Electronically signed by: Franky Stanford MD 08/13/2024 10:58 PM EST RP Workstation: [id number]   MR ANGIO HEAD WO CONTRAST Result Date: 08/13/2024 EXAM: MR Angiography Head without intravenous Contrast. 08/13/2024 09:34:24 PM TECHNIQUE: Magnetic resonance angiography images of the head without intravenous contrast. Multiplanar 2D and 3D reformatted images are provided for review. COMPARISON: None provided. CLINICAL HISTORY: Neuro deficit, acute, stroke suspected; limited diffusion and flair imaging. FINDINGS: BRAIN PARENCHYMA: Intermediate-sized acute/early subacute infarct of the right frontal operculum and insula. Scattered punctate foci of acute ischemia in the left parietal lobe and right frontal white matter. Early confluent hyperintense T2-weighted signal within the cerebral white matter, most commonly due to chronic small vessel disease. Prominent. ANTERIOR CIRCULATION: No significant stenosis of the internal carotid arteries. No significant stenosis of the  anterior cerebral arteries. No significant stenosis of the middle cerebral arteries. No aneurysm. POSTERIOR CIRCULATION: No significant stenosis of the posterior cerebral arteries. No significant stenosis of the basilar artery. No significant stenosis of the vertebral arteries. No aneurysm. IMPRESSION: 1. Intermediate-sized acute/early subacute infarct of the right frontal operculum and insula. 2. Scattered punctate foci of acute ischemia in the left parietal lobe and right frontal white matter. 3. Early confluent hyperintense T2-weighted signal within the cerebral white matter, most commonly due to chronic small vessel disease. Electronically signed by: Franky Stanford MD 08/13/2024 10:58 PM EST RP Workstation: [id number]   CT HEAD WO CONTRAST ( ) Result Date: 08/13/2024 EXAM: CT HEAD WITH CONTRAST 08/13/2024 06:21:25 PM TECHNIQUE: CT of the head was performed with the administration of intravenous contrast. Automated exposure control, iterative reconstruction, and/or weight based adjustment of the mA/kV was utilized to reduce the radiation dose to as low as reasonably achievable. COMPARISON: 08/13/2024 CLINICAL HISTORY: Mental status change, persistent or worsening. FINDINGS: BRAIN AND VENTRICLES: Diffuse edema with loss of cortical sulcation throughout the right MCA territory. Superimposed contrast contained within this region. Degree of edema and loss of gray-white matter differentiation is overall increased in size as compared to the infarct as seen on prior brain MRI, raising the concern for possible interval expansion of the right MCA territory infarct. Difficult to be certain of the extent of ischemia given the superimposed contrast and staining. No acute intracranial hemorrhage. No other significant findings regional mass effect or midline shift. No other new large vessel territory infarct. No other acute intracranial hemorrhage. No mass lesion or hydrocephalus. No acute fluid collection. Underlying  advanced changes of chronic microvascular ischemic disease noted. ORBITS: No acute abnormality. SINUSES: No acute abnormality. SOFT TISSUES  AND SKULL: No convincing abnormal hyperdense vessel. Calcified atherosclerosis present about the skull base. EEG leads overlying the scalp. No acute soft tissue abnormality. No skull fracture. IMPRESSION: 1. Evolving right MCA territory infarct with associated loss of gray-white matter differentiation and cortical sulcation, with persistent superimposed contrast scanning. Overall, the degree of gray-white matter differentiation loss appears increased as compared to prior MRI performed earlier the same day, potentially reflecting interval expansion of the right MCA territory infarct. Difficult to be certain of this finding given the superimposed contrast staining. Correlation with repeat brain MRI and could be performed for further evaluation as warranted. 2. No acute intracranial hemorrhage. 3. No other new intracranial abnormality. 4. Underlying Advanced chronic microvascular ischemic changes. Electronically signed by: Morene Hoard MD 08/13/2024 07:36 PM EST RP Workstation: [id number]   ECHOCARDIOGRAM COMPLETE Result Date: 08/13/2024    ECHOCARDIOGRAM REPORT   Patient Name:   Norma Coleman Date of Exam: 08/13/2024 Medical Rec #:  [id number]         Height:       63.0 in Accession #:    [id number]        Weight:       132.3 lb Date of Birth:  [birth year]        BSA:          1.622 m Patient Age:    89 years          BP:           142/45 mmHg Patient Gender: F                 HR:           73 bpm. Exam Location:  Inpatient Procedure: 2D Echo, Cardiac Doppler and Color Doppler (Both Spectral and Color            Flow Doppler were utilized during procedure). Indications:    Stroke  History:        Patient has no prior history of Echocardiogram examinations.                 Cardiomegaly; Risk Factors:Dyslipidemia, Hypertension and                 Diabetes.  Sonographer:     Sherlean Dubin Referring Phys: [id number] CORTNEY E DE LA TORRE  Sonographer Comments: Image acquisition challenging due to respiratory motion and Image acquisition challenging due to patient body habitus. IMPRESSIONS  1. Left ventricular ejection fraction, by estimation, is 60 to 65%. The left ventricle has normal function. The left ventricle has no regional wall motion abnormalities. There is severe concentric left ventricular hypertrophy.  2. Right ventricular systolic function is normal. The right ventricular size is normal.  3. Left atrial size was moderately dilated.  4. The mitral valve is degenerative. No evidence of mitral valve regurgitation. No evidence of mitral stenosis. Moderate to severe mitral annular calcification.  5. The aortic valve was not well visualized. Aortic valve regurgitation is not visualized. No aortic stenosis is present.  6. The inferior vena cava is normal in size with greater than 50% respiratory variability, suggesting right atrial pressure of 3 mmHg. Comparison(s): No prior Echocardiogram. FINDINGS  Left Ventricle: Left ventricular ejection fraction, by estimation, is 60 to 65%. The left ventricle has normal function. The left ventricle has no regional wall motion abnormalities. The left ventricular internal cavity size was normal in size. There is  severe concentric left ventricular hypertrophy. Left ventricular diastolic function could not be  evaluated due to mitral annular calcification (moderate or greater). Right Ventricle: The right ventricular size is normal. No increase in right ventricular wall thickness. Right ventricular systolic function is normal. Left Atrium: Left atrial size was moderately dilated. Right Atrium: Right atrial size was normal in size. Pericardium: There is no evidence of pericardial effusion. Mitral Valve: The mitral valve is degenerative in appearance. Moderate to severe mitral annular calcification. No evidence of mitral valve regurgitation. No  evidence of mitral valve stenosis. Tricuspid Valve: The tricuspid valve is normal in structure. Tricuspid valve regurgitation is mild . No evidence of tricuspid stenosis. Aortic Valve: The aortic valve was not well visualized. Aortic valve regurgitation is not visualized. No aortic stenosis is present. Aortic valve mean gradient measures 8.5 mmHg. Aortic valve peak gradient measures 17.1 mmHg. Aortic valve area, by VTI measures 1.73 cm. Pulmonic Valve: The pulmonic valve was not well visualized. Pulmonic valve regurgitation is trivial. No evidence of pulmonic stenosis. Aorta: The aortic root and ascending aorta are structurally normal, with no evidence of dilitation. Venous: The inferior vena cava is normal in size with greater than 50% respiratory variability, suggesting right atrial pressure of 3 mmHg. IAS/Shunts: No atrial level shunt detected by color flow Doppler.  LEFT VENTRICLE PLAX 2D LVIDd:         5.20 cm   Diastology LVIDs:         3.50 cm   LV e' medial:    7.94 cm/s LV PW:         1.15 cm   LV E/e' medial:  16.5 LV IVS:        1.00 cm   LV e' lateral:   7.40 cm/s LVOT diam:     2.00 cm   LV E/e' lateral: 17.7 LV SV:         76 LV SV Index:   47 LVOT Area:     3.14 cm  RIGHT VENTRICLE             IVC RV Basal diam:  3.10 cm     IVC diam: 1.60 cm RV Mid diam:    2.00 cm RV S prime:     12.80 cm/s  PULMONARY VEINS TAPSE (M-mode): 1.8 cm      Diastolic Velocity: 24.80 cm/s                             S/D Velocity:       1.70                             Systolic Velocity:  42.70 cm/s LEFT ATRIUM             Index        RIGHT ATRIUM           Index LA diam:        4.55 cm 2.81 cm/m   RA Area:     16.70 cm LA Vol (A2C):   48.1 ml 29.65 ml/m  RA Volume:   38.70 ml  23.86 ml/m LA Vol (A4C):   82.2 ml 50.68 ml/m LA Biplane Vol: 66.7 ml 41.12 ml/m  AORTIC VALVE AV Area (Vmax):    1.70 cm AV Area (Vmean):   1.76 cm AV Area (VTI):     1.73 cm AV Vmax:           206.50 cm/s AV Vmean:  134.000 cm/s  AV VTI:            0.441 m AV Peak Grad:      17.1 mmHg AV Mean Grad:      8.5 mmHg LVOT Vmax:         112.00 cm/s LVOT Vmean:        75.200 cm/s LVOT VTI:          0.244 m LVOT/AV VTI ratio: 0.55  AORTA Ao Root diam: 3.30 cm Ao Asc diam:  2.90 cm MITRAL VALVE                TRICUSPID VALVE MV Area (PHT): 3.16 cm     TR Peak grad:   29.2 mmHg MV Decel Time: 240 msec     TR Vmax:        270.00 cm/s MV E velocity: 131.00 cm/s MV A velocity: 81.50 cm/s   SHUNTS MV E/A ratio:  1.61         Systemic VTI:  0.24 m                             Systemic Diam: 2.00 cm Franck Azobou Tonleu Electronically signed by Joelle Cedars Tonleu Signature Date/Time: 08/13/2024/1:20:09 PM    Final     Vitals:   08/14/24 1000 08/14/24 1100 08/14/24 1144 08/14/24 1200  BP: 107/70 (!) 151/58  (!) 107/52  Pulse: 96 (!) 107  (!) 103  Resp: 11 13  13   Temp:   98.4 F (36.9 C)   TempSrc:   Oral   SpO2: 96% 97%  97%  Weight:      Height:         PHYSICAL EXAM General: critically ill  Psych:  Mood and affect appropriate for situation CV: Regular rate and rhythm on monitor Respiratory:  Regular, unlabored respirations on nasal cannula  GI: Abdomen soft and nontender   NEURO:  Mental Status: She is lethargic however she does open her eyes to voice, she follows commands oriented to self hospital and age.  No aphasia, mild dysarthria  Cranial Nerves:  II: PERRL. Visual fields with left hemianopia III, IV, VI: EOMI. Eyelids elevate symmetrically.  V: Sensation is intact to light touch and symmetrical to face.  VII: left facial  VIII: hearing intact to voice. IX, X: Palate elevates symmetrically. Phonation is normal.  KP:Dynloizm shrug 5/5. XII: tongue is midline without fasciculations. Motor: right side is purposeful, left arm and leg 3/5 with drift Tone: is normal and bulk is normal Sensation- responds to noxious stimuli    Coordination: unable to assess Gait- deferred  Most Recent NIH   1a Level of  Conscious.: 1 1b LOC Questions: 0 1c LOC Commands: 0 2 Best Gaze:  3 Visual: 2 4 Facial Palsy: 1 5a Motor Arm - left: 2 5b Motor Arm - Right: 1 6a Motor Leg - Left: 2 6b Motor Leg - Right: 1 7 Limb Ataxia:  8 Sensory:  9 Best Language:  10 Dysarthria: 1 11 Extinct. and Inatten.: 1 TOTAL: 12   ASSESSMENT/PLAN  Ms. Norma Coleman is a 89 y.o. female with history of  hx of end-stage renal disease on dialysis, hypertension, hyperlipidemia and hypothyroidism who presents with acute onset left-sided weakness, left hemianopsia and dysarthria.   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 Small vessel disease.  CTA head & neck Occlusion of the right M2 inferior division at its origin.  CT perfusion  Right MCA penumbra, with the volume being slightly overestimated as the small core infarct visible on CT was not detected by perfusion processing. MRI: 1. Small acute right MCA infarct. 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 Repeat CT head: 1. Mild hyperdensity over the right MCA territory, likely contrast staining following neurointerventional procedure. 2. Advanced chronic ischemic white matter changes. Repeat MRI scan brain shows stable ischemic appearance of the right MCA infarct without significant cytotoxic edema 2D Echo  EF 60-65%. severe concentric LVH. LA moderately dilated  Recommend 30-day heart monitor after discharge-cardiology aware LDL 35 HgbA1c 5.5 VTE prophylaxis - heparin  SQ No antithrombotic prior to admission, now on aspirin 300 mg suppository daily  Therapy recommendations:  Pending Disposition:  pending   New onset focal seizures  Seizure precautions  Loaded with Valproic acid followed by 300 mg Q8h   LTM 12/4: ABNORMALITY - Continuous slow, generalized and lateralized right hemisphere   IMPRESSION: This study is suggestive of cortical dysfunction in right hemisphere likely secondary to underlying structural abnormality. Additionally is generalized cerebral dysfunction (encephalopathy). No seizures or definite epileptiform discharges were seen throughout the recording. Will discontinue LTM today    Hypertension Home meds:  none Stable Was requiring cleviprex gtt, now off  Blood Pressure Goal: SBP 120-160 for first 24 hours then less than 180   Hyperlipidemia Home meds:  atorvastatin  40mg ,  resumed in hospital LDL 35, goal < 70 Continue statin at discharge  Hypothyroidism  Restart home levothyroxine    ESRD on HD MWF Nephro following  HD today   Dysphagia Patient has post-stroke dysphagia, SLP consulted    Diet   DIET - DYS 1 Room service appropriate? Yes with Assist; Fluid consistency: Thin   Advance diet as tolerated  Other Stroke Risk Factors elderly   Hospital day # 2   Karna Geralds DNP, ACNPC-AG  [facility]  I have personally obtained history,examined this patient, reviewed notes, independently viewed imaging studies, participated in medical decision making and plan of care.ROS completed by me personally and pertinent positives fully documented  I have made any additions or clarifications directly to the above note. Agree with note above.  Continue mobilization out of bed.  Physical Occupational Therapy consults.  Speech therapy for swallow eval.  Check valproic acid level tomorrow.  Continue current dose of valproic acid for seizures.  No family was present for the discussion.  Transfer out of the ICU today if bed available.   I personally spent a total of 50 minutes in the care of the patient today including getting/reviewing separately obtained history, performing a medically appropriate exam/evaluation, counseling and educating, placing  orders, referring and communicating with other health care professionals, documenting clinical information in the EHR, independently interpreting results, and coordinating care.        Eather Popp, MD Medical Director [facility] Pager: [phone] 08/14/2024 12:52 PM     To contact Stroke Continuity provider, please refer to [email or web address]. After hours, contact General Neurology

## 2024-08-14 NOTE — Evaluation (Signed)
 Physical Therapy Evaluation Patient Details Name: Norma Coleman MRN: [id number] DOB: 1933/07/18 Today's Date: 08/14/2024  History of Present Illness  89 yo female presents with L side weakness, L hemianopsia and dysarthria after falling out of bed and striking her head. NIH 10 CT (+) R MCA occlusion, R cerebellopontine mass compatible with meningioma, R MCA infarct involving frontoparietal operculum.  12/2 R M1 thrombectomy TICI 3. Complicated by new onset seizure activity, activity localizes to the Left hemisphere. MRI 12/3 also showed L parietal lobe and R frontal acute infarct PMH includes: ESRD on HD, HTN, DM2, CHF, DVT.  Clinical Impression   Pt admitted secondary to problem above with deficits below. PTA patient's functional status is unknown at this time (no family present).  Pt currently is following <25% of simple commands with Rt extremities. She is  stuporous requiring max, constant stimulation to elicit responses. She is dependent in all mobility. Noted daughter currently wants full scope of care, therefore will conduct trial of PT and update recommendations as appropriate. Currently would recommend long-term care without follow-up PT.  Anticipate patient might benefit from PT to address problems listed below. Will continue to follow acutely to maximize functional mobility, independence, and safety.           If plan is discharge home, recommend the following:  (unable to discharge home unless by [facility] and cared for at dependent level)   Can travel by private vehicle   No    Equipment Recommendations Hospital bed;Hoyer lift;Wheelchair (measurements PT);Wheelchair cushion (measurements PT)  Recommendations for Other Services       Functional Status Assessment Patient has had a recent decline in their functional status and demonstrates the ability to make significant improvements in function in a reasonable and predictable amount of time.     Precautions / Restrictions  Precautions Precautions: Fall Recall of Precautions/Restrictions: Impaired      Mobility  Bed Mobility Overal bed mobility: Needs Assistance             General bed mobility comments: dependently elevated to chair position via bed; brought torso forward into unsupported sitting with no righting reactions noted    Transfers                        Ambulation/Gait                  Stairs            Wheelchair Mobility     Tilt Bed    Modified Rankin (Stroke Patients Only) Modified Rankin (Stroke Patients Only) Pre-Morbid Rankin Score:  (unknown prior functional status) Modified Rankin: Severe disability     Balance Overall balance assessment: Needs assistance Sitting-balance support: Bilateral upper extremity supported, Feet unsupported Sitting balance-Leahy Scale: Zero Sitting balance - Comments: supported sitting leans to rt; unsupported sitting no righting reactions noted                                     Pertinent Vitals/Pain Pain Assessment Facial Expression: Relaxed, neutral Body Movements: Absence of movements Muscle Tension: Relaxed Compliance with ventilator (intubated pts.): N/A Vocalization (extubated pts.): N/A CPOT Total: 0    Home Living Family/patient expects to be discharged to:: Unsure                        Prior Function Prior Level of  Function : Patient poor historian/Family not available                     Extremity/Trunk Assessment   Upper Extremity Assessment Upper Extremity Assessment: Defer to OT evaluation    Lower Extremity Assessment Lower Extremity Assessment: RLE deficits/detail;LLE deficits/detail RLE Deficits / Details: no AROM noted; in chair position passively extend Rt knee and pt tries to hold it up with slow, controlled lowering to bed; no withdrawal to pain LLE Deficits / Details: no AROM noted; in chair position passively extend Lt knee and pt tries to  hold it up with uncontrolled drop of leg to bed; no withdrawal to pain    Cervical / Trunk Assessment Cervical / Trunk Assessment: Other exceptions Cervical / Trunk Exceptions: leans to right in sitting  Communication   Communication Communication: Impaired Factors Affecting Communication: Difficulty expressing self;Other (comment) (decr level of arousal)    Cognition Arousal: Stuporous     PT - Cognitive impairments: Difficult to assess Difficult to assess due to: Level of arousal                     PT - Cognition Comments: Was able to identify DOB with choices given for month, stated date, and finished 19... Following commands: Impaired Following commands impaired: Follows one step commands inconsistently, Follows one step commands with increased time     Cueing Cueing Techniques: Verbal cues, Tactile cues     General Comments General comments (skin integrity, edema, etc.): HR max 127, BP stable with changes in position; on 2L    Exercises     Assessment/Plan    PT Assessment Patient needs continued PT services  PT Problem List Decreased strength;Decreased activity tolerance;Decreased balance;Decreased mobility;Decreased knowledge of use of DME;Decreased knowledge of precautions;Impaired sensation;Impaired tone       PT Treatment Interventions DME instruction;Gait training;Functional mobility training;Therapeutic activities;Therapeutic exercise;Balance training;Neuromuscular re-education;Cognitive remediation;Patient/family education;Wheelchair mobility training    PT Goals (Current goals can be found in the Care Plan section)  Acute Rehab PT Goals Patient Stated Goal: unable PT Goal Formulation: Patient unable to participate in goal setting Time For Goal Achievement: 08/28/24 Potential to Achieve Goals: Poor    Frequency Min 1X/week     Co-evaluation PT/OT/SLP Co-Evaluation/Treatment: Yes Reason for Co-Treatment: Complexity of the patient's  impairments (multi-system involvement);To address functional/ADL transfers PT goals addressed during session: Balance;Strengthening/ROM         AM-PAC PT 6 Clicks Mobility  Outcome Measure Help needed turning from your back to your side while in a flat bed without using bedrails?: Total Help needed moving from lying on your back to sitting on the side of a flat bed without using bedrails?: Total Help needed moving to and from a bed to a chair (including a wheelchair)?: Total Help needed standing up from a chair using your arms (e.g., wheelchair or bedside chair)?: Total Help needed to walk in hospital room?: Total Help needed climbing 3-5 steps with a railing? : Total 6 Click Score: 6    End of Session Equipment Utilized During Treatment: Oxygen Activity Tolerance: Patient limited by lethargy Patient left: in bed;with call bell/phone within reach;with bed alarm set Nurse Communication: Other (comment) (minimal responses) PT Visit Diagnosis: Muscle weakness (generalized) (M62.81);History of falling (Z91.81);Other symptoms and signs involving the nervous system (R29.898)    Time: 9184-9166 PT Time Calculation (min) (ACUTE ONLY): 18 min   Charges:   PT Evaluation $PT Eval Low Complexity: 1 Low  PT General Charges $$ ACUTE PT VISIT: 1 Visit          Macario RAMAN, PT Acute Rehabilitation Services  Office [phone]   Macario SHAUNNA Soja 08/14/2024, 8:52 AM

## 2024-08-15 LAB — IRON AND TIBC
Iron: 140 ug/dL (ref 28–170)
Saturation Ratios: 79 % — ABNORMAL HIGH (ref 10.4–31.8)
TIBC: 176 ug/dL — ABNORMAL LOW (ref 250–450)
UIBC: 36 ug/dL

## 2024-08-15 LAB — CBC
HCT: 21.3 % — ABNORMAL LOW (ref 36.0–46.0)
Hemoglobin: 7 g/dL — ABNORMAL LOW (ref 12.0–15.0)
MCH: 32 pg (ref 26.0–34.0)
MCHC: 32.9 g/dL (ref 30.0–36.0)
MCV: 97.3 fL (ref 80.0–100.0)
Platelets: 165 K/uL (ref 150–400)
RBC: 2.19 MIL/uL — ABNORMAL LOW (ref 3.87–5.11)
RDW: 14.7 % (ref 11.5–15.5)
WBC: 5.5 K/uL (ref 4.0–10.5)
nRBC: 0 % (ref 0.0–0.2)

## 2024-08-15 LAB — GLUCOSE, CAPILLARY
Glucose-Capillary: 79 mg/dL (ref 70–99)
Glucose-Capillary: 80 mg/dL (ref 70–99)
Glucose-Capillary: 84 mg/dL (ref 70–99)
Glucose-Capillary: 86 mg/dL (ref 70–99)
Glucose-Capillary: 87 mg/dL (ref 70–99)
Glucose-Capillary: 87 mg/dL (ref 70–99)
Glucose-Capillary: 89 mg/dL (ref 70–99)

## 2024-08-15 LAB — BASIC METABOLIC PANEL WITH GFR
Anion gap: 18 — ABNORMAL HIGH (ref 5–15)
BUN: 52 mg/dL — ABNORMAL HIGH (ref 8–23)
CO2: 23 mmol/L (ref 22–32)
Calcium: 8.2 mg/dL — ABNORMAL LOW (ref 8.9–10.3)
Chloride: 92 mmol/L — ABNORMAL LOW (ref 98–111)
Creatinine, Ser: 7.47 mg/dL — ABNORMAL HIGH (ref 0.44–1.00)
GFR, Estimated: 5 mL/min — ABNORMAL LOW (ref >60)
Glucose, Bld: 77 mg/dL (ref 70–99)
Potassium: 4.3 mmol/L (ref 3.5–5.1)
Sodium: 133 mmol/L — ABNORMAL LOW (ref 135–145)

## 2024-08-15 MED ORDER — DOXERCALCIFEROL 4 MCG/2ML IV SOLN
INTRAVENOUS | Status: AC
  Filled 2024-08-15: qty 4

## 2024-08-15 MED ORDER — ALBUMIN HUMAN 25 % IV SOLN
INTRAVENOUS | Status: AC
  Filled 2024-08-15: qty 100

## 2024-08-15 MED ORDER — HEPARIN SODIUM (PORCINE) 1000 UNIT/ML IJ SOLN
INTRAMUSCULAR | Status: AC
  Filled 2024-08-15: qty 2

## 2024-08-15 MED ORDER — MIDODRINE HCL 5 MG PO TABS
ORAL_TABLET | ORAL | Status: AC
  Filled 2024-08-15: qty 2

## 2024-08-15 MED ORDER — VALPROATE SODIUM 100 MG/ML IV SOLN
400.0000 mg | Freq: Three times a day (TID) | INTRAVENOUS | Status: DC
  Administered 2024-08-15 – 2024-08-16 (×3): 400 mg via INTRAVENOUS
  Filled 2024-08-15 (×4): qty 4

## 2024-08-15 MED ORDER — NEPRO/CARBSTEADY PO LIQD
237.0000 mL | Freq: Two times a day (BID) | ORAL | Status: DC
  Administered 2024-08-15 – 2024-08-20 (×7): 237 mL via ORAL

## 2024-08-15 MED ORDER — SEVELAMER CARBONATE 800 MG PO TABS
1600.0000 mg | ORAL_TABLET | Freq: Three times a day (TID) | ORAL | Status: DC
  Administered 2024-08-15 – 2024-08-20 (×13): 1600 mg via ORAL
  Filled 2024-08-15 (×15): qty 2

## 2024-08-15 NOTE — Progress Notes (Signed)
 Pt. Awake and oriented with no complaints. Consent signed and on file. Tx started with no complications   Tx duration: 3.25 hours UF removal:  Access used: left AVF Access issue: None  Tx completed and tolerated. Pressure dressing applied. Endorsed to floor nurse.  Nethan Caudillo Rubi B. Clelia Trabucco, RN [facility]

## 2024-08-15 NOTE — Progress Notes (Addendum)
 STROKE TEAM PROGRESS NOTE    SIGNIFICANT HOSPITAL EVENTS 12/2 presented with acute left side weakness. NO TNK due to outside window, CTA with R MCA occlusion, taken for mechanical thrombectomy  12/4: Repeat CT 12/3 raised concerns about cytotoxic edema in the right MCA territory suggesting right MCA reocclusion however MRI scan showed stable appearance of the right MCA infarct without significant cytotoxic edema and MRA brain showed patent right MCA  INTERIM HISTORY/SUBJECTIVE  No family at bedside.  RN at bedside. Dialysis in room during exam.  Patient is awake and oriented, follows commands.  Continues to have some left facial droop, left hemianopia.  No drift was seen in left leg.    OK to transfer out of ICU, no need for telemetry floor.   CBC    Component Value Date/Time   WBC 5.5 08/15/2024 0338   RBC 2.19 (L) 08/15/2024 0338   HGB 7.0 (L) 08/15/2024 0338   HCT 21.3 (L) 08/15/2024 0338   PLT 165 08/15/2024 0338   MCV 97.3 08/15/2024 0338   MCH 32.0 08/15/2024 0338   MCHC 32.9 08/15/2024 0338   RDW 14.7 08/15/2024 0338   LYMPHSABS 0.8 08/12/2024 1233   MONOABS 0.5 08/12/2024 1233   EOSABS 0.0 08/12/2024 1233   BASOSABS 0.0 08/12/2024 1233    BMET    Component Value Date/Time   NA 133 (L) 08/15/2024 0338   K 4.3 08/15/2024 0338   CL 92 (L) 08/15/2024 0338   CO2 23 08/15/2024 0338   GLUCOSE 77 08/15/2024 0338   BUN 52 (H) 08/15/2024 0338   CREATININE 7.47 (H) 08/15/2024 0338   CALCIUM  8.2 (L) 08/15/2024 0338   GFRNONAA 5 (L) 08/15/2024 0338    IMAGING past 24 hours No results found.   Vitals:   08/15/24 1215 08/15/24 1230 08/15/24 1300 08/15/24 1307  BP: 125/68 (!) 144/63 (!) 126/55 (!) 136/47  Pulse: (!) 108 (!) 102 (!) 106 (!) 102  Resp: 15 12 12 10   Temp:    98.2 F (36.8 C)  TempSrc:      SpO2: 96% 94% 95% 95%  Weight:    64.3 kg  Height:         PHYSICAL EXAM General: critically ill  Psych:  Mood and affect appropriate for situation CV:  Regular rate and rhythm on monitor Respiratory:  Regular, unlabored respirations on nasal cannula  GI: Abdomen soft and nontender   NEURO:  Mental Status: She is lethargic however she does open her eyes to voice, she follows commands oriented to self hospital and age.  No aphasia, mild dysarthria  Cranial Nerves:  II: PERRL. Visual fields with left hemianopia III, IV, VI: EOMI. Eyelids elevate symmetrically.  V: Sensation is intact to light touch and symmetrical to face.  VII: left facial  VIII: hearing intact to voice. IX, X: Palate elevates symmetrically. Phonation is normal.  KP:Dynloizm shrug 5/5. XII: tongue is midline without fasciculations. Motor: right side is purposeful, left arm and leg 3/5 with drift Tone: is normal and bulk is normal Sensation- responds to noxious stimuli    Coordination: unable to assess Gait- deferred  Most Recent NIH   1a Level of Conscious.: 1 1b LOC Questions: 0 1c LOC Commands: 0 2 Best Gaze:  3 Visual: 2 4 Facial Palsy: 1 5a Motor Arm - left: 2 5b Motor Arm - Right: 1 6a Motor Leg - Left: 2 6b Motor Leg - Right: 1 7 Limb Ataxia:  8 Sensory:  9 Best Language:  10  Dysarthria: 1 11 Extinct. and Inatten.: 1 TOTAL: 12   ASSESSMENT/PLAN  Norma Coleman is a 89 y.o. female with history of  hx of end-stage renal disease on dialysis, hypertension, hyperlipidemia and hypothyroidism who presents with acute onset left-sided weakness, left hemianopsia and dysarthria.   NIH on Admission 7  Acute Ischemic Infarct:  right MCA s/p mechanical thrombectomy R M1 with TICI 3 recanalization  Etiology:  likely cardioembolic   Code Stroke  CT head Acute right MCA infarct involving the frontoparietal operculum. Small vessel disease.  CTA head & neck Occlusion of the right M2 inferior division at its origin.  CT perfusion  Right MCA penumbra, with the volume being slightly overestimated as the small core infarct visible on CT was not detected by  perfusion processing. MRI: 1. Small acute right MCA infarct. 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 Repeat CT head: 1. Mild hyperdensity over the right MCA territory, likely contrast staining following neurointerventional procedure. 2. Advanced chronic ischemic white matter changes. Repeat MRI scan brain shows stable ischemic appearance of the right MCA infarct without significant cytotoxic edema 2D Echo  EF 60-65%. severe concentric LVH. LA moderately dilated  Recommend 30-day heart monitor after discharge-cardiology aware LDL 35 HgbA1c 5.5 VTE prophylaxis - heparin  SQ No antithrombotic prior to admission, continue asa 81mg  Por or aspirin  300 mg suppository daily  Therapy recommendations:  Long-term facility Disposition:  pending   New onset focal seizures  Seizure precautions  Loaded with Valproic  acid followed by 300 mg Q8h  LTM 12/4: ABNORMALITY - Continuous slow, generalized and lateralized right hemisphere   IMPRESSION: This study is suggestive of cortical dysfunction in right hemisphere likely secondary to underlying structural abnormality. Additionally is generalized cerebral dysfunction (encephalopathy). No seizures or definite epileptiform discharges were seen throughout the recording.   Hypertension Home meds:  none Stable Blood Pressure Goal: SBP 120-160 for first 24 hours then less than 180   Hyperlipidemia Home meds:  atorvastatin  40mg ,  resumed in hospital LDL 35, goal < 70 Continue statin at discharge  Hypothyroidism  Continue home levothyroxine    ESRD on HD MWF Nephro following  HD today  AM labs  Dysphagia Patient has post-stroke dysphagia, SLP consulted       Diet    DIET - DYS 1 Room service appropriate? Yes with Assist; Fluid consistency: Thin        Advance diet as tolerated  Other Stroke Risk Factors Advanced Age  Hospital day # 3   Pt seen by Neuro NP/APP  with MD. Note/plan to be edited by MD as needed.    [name], DNP [facility] Please use AMION for contact information & EPIC for messaging.  I have personally obtained history,examined this patient, reviewed notes, independently viewed imaging studies, participated in medical decision making and plan of care.ROS completed by me personally and pertinent positives fully documented  I have made any additions or clarifications directly to the above note. Agree with note above.  Patient neurologically continues to improve.  She still has mild left-sided weakness and hemianopsia.  Mobilize out of bed.  Therapy consults.  Transfer out of ICU today.  She will likely need rehabilitation in a skilled nursing setting.  No family at the bedside.   I personally spent a total of 50 minutes in the care of the patient today including getting/reviewing separately obtained history, performing a medically appropriate exam/evaluation, counseling and educating, placing orders, referring  and communicating with other health care professionals, documenting clinical information in the EHR, independently interpreting results, and coordinating care.         Eather Popp, MD Medical Director [facility] Pager: [phone] 08/15/2024 3:44 PM

## 2024-08-15 NOTE — Progress Notes (Signed)
   08/15/24 1307  Vitals  Temp 98.2 F (36.8 C)  Pulse Rate (!) 102  Resp 10  BP (!) 136/47  SpO2 95 %  O2 Device Room Air  Weight 64.3 kg  Oxygen Therapy  Patient Activity (if Appropriate) In bed  Oximetry Probe Site Changed No  Post Treatment  Dialyzer Clearance Lightly streaked  Liters Processed 67.6  Fluid Removed (mL) 500 mL  Tolerated HD Treatment Yes  Post-Hemodialysis Comments Pt tolerated tx wedll and with no complictions  AVG/AVF Arterial Site Held (minutes) 5 minutes  AVG/AVF Venous Site Held (minutes) 5 minutes

## 2024-08-15 NOTE — Evaluation (Signed)
 Speech Language Pathology Evaluation Patient Details Name: Norma Coleman MRN: [id number] DOB: [birth year] Today's Date: 08/15/2024 Time: 9063-9048 SLP Time Calculation (min) (ACUTE ONLY): 15 min  Problem List:  Patient Active Problem List   Diagnosis Date Noted   Stroke (cerebrum) (HCC) 08/12/2024   Malnutrition of moderate degree 09/15/2022   ESRD (end stage renal disease) (HCC) 09/13/2022   Influenza A 09/12/2022   Acute renal failure superimposed on stage 5 chronic kidney disease, not on chronic dialysis (HCC) 08/07/2022   Constipation 08/07/2022   Paget's disease of bone 08/06/2022   Lower GI bleed 08/05/2022   CKD (chronic kidney disease) stage 5, GFR less than 15 ml/min (HCC) 08/05/2022   Essential hypertension 08/05/2022   Hyperlipidemia 08/05/2022   History of DVT (deep vein thrombosis) 08/05/2022   Hypothyroidism 08/05/2022   DM2 (diabetes mellitus, type 2) (HCC) 08/05/2022   Diarrhea 08/05/2022   Chronic diastolic CHF (congestive heart failure) (HCC) 08/05/2022   Cardiomegaly 08/05/2022   Past Medical History:  Past Medical History:  Diagnosis Date   DVT (deep venous thrombosis) (HCC)    GI bleed due to NSAIDs    High cholesterol    Hypertension    Hyperthyroidism    Pneumonia    Skin cancer    Thyroid  disease    Past Surgical History:  Past Surgical History:  Procedure Laterality Date   A/V SHUNT INTERVENTION Left 05/08/2024   Procedure: A/V SHUNT INTERVENTION;  Surgeon: Sheree Penne Bruckner, MD;  Location: [facility];  Service: Cardiovascular;  Laterality: Left;   IR CT HEAD LTD  08/12/2024   IR FLUORO GUIDE CV LINE RIGHT  09/18/2022   IR PERCUTANEOUS ART THROMBECTOMY/INFUSION INTRACRANIAL INC DIAG ANGIO  08/12/2024   IR US  GUIDE VASC ACCESS RIGHT  09/18/2022   IR US  GUIDE VASC ACCESS RIGHT  08/12/2024   IR US  GUIDE VASC ACCESS RIGHT  08/12/2024   KNEE SURGERY     RADIOLOGY WITH ANESTHESIA N/A 08/12/2024   Procedure: RADIOLOGY WITH ANESTHESIA;   Surgeon: Radiologist, Medication, MD;  Location: [facility];  Service: Radiology;  Laterality: N/A;   TONSILLECTOMY     TUBAL LIGATION     VENOUS ANGIOPLASTY  05/08/2024   Procedure: VENOUS ANGIOPLASTY;  Surgeon: Sheree Penne Bruckner, MD;  Location: [facility];  Service: Cardiovascular;;   HPI:  89 yo female presents with L side weakness, L hemianopsia and dysarthria after falling out of bed and striking her head. NIH 10 CT (+) R MCA occlusion, R cerebellopontine mass compatible with meningioma, R MCA infarct involving frontoparietal operculum.  12/2 R M1 thrombectomy TICI 3. Complicated by new onset seizure activity, activity localizes to the Left hemisphere. MRI 12/3 also showed L parietal lobe and R frontal acute infarct PMH includes: ESRD on HD, HTN, DM2, CHF, DVT. Lives at home with dtr; HD M/W/F.   Assessment / Plan / Recommendation Clinical Impression  Pt presents with decreased initiation and delays in response time, mild impairments in working memory, impaired awareness. Oriented to person/place but not elements of time and situation.  Receptive/expressive language are [facility]. Speech is clear and fluent.  Recommend SLP f/u while in acute care to work toward improved awareness and understanding of condition and impact on function.    SLP Assessment  SLP Recommendation/Assessment: Patient needs continued Speech Language Pathology Services SLP Visit Diagnosis: Cognitive communication deficit (R41.841)     Assistance Recommended at Discharge  Frequent or constant Supervision/Assistance  Functional Status Assessment Patient has had a recent decline in  their functional status and demonstrates the ability to make significant improvements in function in a reasonable and predictable amount of time.  Frequency and Duration min 2x/week         SLP Evaluation Cognition  Overall Cognitive Status: No family/caregiver present to determine baseline cognitive functioning (assuming not at  baseline) Arousal/Alertness: Awake/alert Orientation Level: Oriented to person;Oriented to place;Disoriented to situation;Disoriented to time Attention: Selective Selective Attention: Impaired Selective Attention Impairment: Verbal basic Memory: Impaired Memory Impairment: Retrieval deficit Awareness: Impaired Awareness Impairment:  (online awareness)       Comprehension  Visual Recognition/Discrimination Discrimination: Within Function Limits Reading Comprehension Reading Status: Impaired Word level:  (visual acuity)    Expression Expression Primary Mode of Expression: Verbal Verbal Expression Overall Verbal Expression: Appears within functional limits for tasks assessed Written Expression Dominant Hand: Right   Oral / Motor  Oral Motor/Sensory Function Overall Oral Motor/Sensory Function: Within functional limits Motor Speech Overall Motor Speech: Appears within functional limits for tasks assessed            Coleman Norma Laurice 08/15/2024, 10:05 AM Norma L. Vona, MA CCC/SLP Clinical Specialist - Acute Care SLP Acute Rehabilitation Services Office number [phone]

## 2024-08-15 NOTE — Progress Notes (Signed)
  [facility] Progress Note   Subjective:   Remains in neuro ICU but has transfer orders.  Doing well.  On HD currently - per RN running well.   Objective Vitals:   08/15/24 0945 08/15/24 1000 08/15/24 1015 08/15/24 1030  BP: (!) 148/55 138/67 (!) 138/57 (!) 136/54  Pulse: 92 100 (!) 104 97  Resp: 14 14 16 13   Temp:      TempSrc:      SpO2: 96% 96% 97% 96%  Weight:      Height:       Physical Exam General: awake alert and interactive Heart: RRR Lungs: clear on RA Abdomen:soft Extremities: no edema Dialysis Access: LUE AVF +t/b  Additional Objective Labs: Basic Metabolic Panel: Recent Labs  Lab 08/13/24 0247 08/14/24 0401 08/15/24 0338  NA 140 132* 133*  K 5.5* 4.3 4.3  CL 102 95* 92*  CO2 18* 24 23  GLUCOSE 129* 77 77  BUN 71* 31* 52*  CREATININE 10.97* 5.66* 7.47*  CALCIUM  9.3 8.3* 8.2*  PHOS  --  5.7*  --    Liver Function Tests: Recent Labs  Lab 08/12/24 1233 08/13/24 0247  AST 17 22  ALT 9 8  ALKPHOS 332* 275*  BILITOT 0.8 0.7  PROT 6.6 5.4*  ALBUMIN  3.5 2.9*   No results for input(s): LIPASE, AMYLASE in the last 168 hours. CBC: Recent Labs  Lab 08/12/24 1233 08/12/24 1235 08/13/24 0247 08/14/24 0401 08/15/24 0338  WBC 9.8  --  8.7 9.9 5.5  NEUTROABS 8.4*  --   --   --   --   HGB 10.5*   < > 8.4* 7.7* 7.0*  HCT 34.3*   < > 26.8* 23.8* 21.3*  MCV 103.3*  --  100.4* 96.7 97.3  PLT 213  --  202 177 165   < > = values in this interval not displayed.   Blood Culture    Component Value Date/Time   SDES  08/05/2022 2230    BLOOD SITE NOT SPECIFIED Performed at [facility], [address] [place], [place] [address]    SPECREQUEST  08/05/2022 2230    BOTTLES DRAWN AEROBIC AND ANAEROBIC Blood Culture adequate volume Performed at [facility], [address] [place], [place] [address]    CULT  08/05/2022 2230    NO GROWTH 5 DAYS Performed at [facility], [address], [place], [place] [address]    REPTSTATUS 08/11/2022 FINAL 08/05/2022 2230    Cardiac Enzymes: No results for input(s): CKTOTAL, CKMB, CKMBINDEX, TROPONINI in the last 168 hours. CBG: Recent Labs  Lab 08/14/24 1928 08/14/24 2329 08/14/24 2358 08/15/24 0335 08/15/24 0711  GLUCAP 74 66* 89 79 87   Iron Studies:  Recent Labs    08/15/24 0338  IRON 140  TIBC 176*   @lablastinr3 @ Studies/Results: Overnight EEG with video Result Date: 08/14/2024 Shelton Arlin KIDD, MD     08/14/2024  9:03 AM Patient Name: Norma Coleman MRN: [id number] Epilepsy Attending: Arlin KIDD Shelton Referring Physician/Provider: Merrianne Locus, MD Duration: 08/13/2024 0834 to 08/13/2024 2329 Patient history: 89 y.o. female who presents with acute onset left-sided weakness, left hemianopsia and dysarthria. EEG to evaluate for seizure Level of alertness: Awake, asleep AEDs during EEG study: VPA Technical aspects: This EEG study was done with scalp electrodes positioned according to the 10-20 International system of electrode placement. Electrical activity was reviewed with band pass filter of 1-70Hz , sensitivity of 7 uV/mm, display speed of  97mm/sec with a 60Hz  notched filter applied as appropriate. EEG data were recorded continuously and digitally stored.  Video monitoring was available and reviewed as appropriate. Description: During awake state, no clear posterior dominant rhythm was seen. Sleep was characterized by sleep spindles (12 to 14 Hz), maximal frontocentral region.  EEG showed continuous disorganized 6 to 9 Hz theta- alpha activity in left hemisphere as well as continuous 3 to 5 Hz theta-delta slowing in right hemisphere. Hyperventilation and photic stimulation were not performed.    ABNORMALITY - Continuous slow, generalized and lateralized right hemisphere   IMPRESSION: This study is suggestive of cortical dysfunction in right hemisphere likely secondary to underlying structural abnormality. Additionally is  generalized cerebral dysfunction (encephalopathy). No seizures or definite epileptiform discharges were seen throughout the recording.  Arlin MALVA Krebs   MR BRAIN WO CONTRAST Result Date: 08/13/2024 EXAM: MR Angiography Head without intravenous Contrast. 08/13/2024 09:34:24 PM TECHNIQUE: Magnetic resonance angiography images of the head without intravenous contrast. Multiplanar 2D and 3D reformatted images are provided for review. COMPARISON: None provided. CLINICAL HISTORY: Neuro deficit, acute, stroke suspected; limited diffusion and flair imaging. FINDINGS: BRAIN PARENCHYMA: Intermediate-sized acute/early subacute infarct of the right frontal operculum and insula. Scattered punctate foci of acute ischemia in the left parietal lobe and right frontal white matter. Early confluent hyperintense T2-weighted signal within the cerebral white matter, most commonly due to chronic small vessel disease. Prominent. ANTERIOR CIRCULATION: No significant stenosis of the internal carotid arteries. No significant stenosis of the anterior cerebral arteries. No significant stenosis of the middle cerebral arteries. No aneurysm. POSTERIOR CIRCULATION: No significant stenosis of the posterior cerebral arteries. No significant stenosis of the basilar artery. No significant stenosis of the vertebral arteries. No aneurysm. IMPRESSION: 1. Intermediate-sized acute/early subacute infarct of the right frontal operculum and insula. 2. Scattered punctate foci of acute ischemia in the left parietal lobe and right frontal white matter. 3. Early confluent hyperintense T2-weighted signal within the cerebral white matter, most commonly due to chronic small vessel disease. Electronically signed by: Franky Stanford MD 08/13/2024 10:58 PM EST RP Workstation: [id number]   MR ANGIO HEAD WO CONTRAST Result Date: 08/13/2024 EXAM: MR Angiography Head without intravenous Contrast. 08/13/2024 09:34:24 PM TECHNIQUE: Magnetic resonance angiography images  of the head without intravenous contrast. Multiplanar 2D and 3D reformatted images are provided for review. COMPARISON: None provided. CLINICAL HISTORY: Neuro deficit, acute, stroke suspected; limited diffusion and flair imaging. FINDINGS: BRAIN PARENCHYMA: Intermediate-sized acute/early subacute infarct of the right frontal operculum and insula. Scattered punctate foci of acute ischemia in the left parietal lobe and right frontal white matter. Early confluent hyperintense T2-weighted signal within the cerebral white matter, most commonly due to chronic small vessel disease. Prominent. ANTERIOR CIRCULATION: No significant stenosis of the internal carotid arteries. No significant stenosis of the anterior cerebral arteries. No significant stenosis of the middle cerebral arteries. No aneurysm. POSTERIOR CIRCULATION: No significant stenosis of the posterior cerebral arteries. No significant stenosis of the basilar artery. No significant stenosis of the vertebral arteries. No aneurysm. IMPRESSION: 1. Intermediate-sized acute/early subacute infarct of the right frontal operculum and insula. 2. Scattered punctate foci of acute ischemia in the left parietal lobe and right frontal white matter. 3. Early confluent hyperintense T2-weighted signal within the cerebral white matter, most commonly due to chronic small vessel disease. Electronically signed by: Franky Stanford MD 08/13/2024 10:58 PM EST RP Workstation: [id number]   CT HEAD WO CONTRAST ( ) Result Date: 08/13/2024 EXAM: CT HEAD WITH CONTRAST 08/13/2024 06:21:25 PM TECHNIQUE: CT  of the head was performed with the administration of intravenous contrast. Automated exposure control, iterative reconstruction, and/or weight based adjustment of the mA/kV was utilized to reduce the radiation dose to as low as reasonably achievable. COMPARISON: 08/13/2024 CLINICAL HISTORY: Mental status change, persistent or worsening. FINDINGS: BRAIN AND VENTRICLES: Diffuse edema with loss  of cortical sulcation throughout the right MCA territory. Superimposed contrast contained within this region. Degree of edema and loss of gray-white matter differentiation is overall increased in size as compared to the infarct as seen on prior brain MRI, raising the concern for possible interval expansion of the right MCA territory infarct. Difficult to be certain of the extent of ischemia given the superimposed contrast and staining. No acute intracranial hemorrhage. No other significant findings regional mass effect or midline shift. No other new large vessel territory infarct. No other acute intracranial hemorrhage. No mass lesion or hydrocephalus. No acute fluid collection. Underlying advanced changes of chronic microvascular ischemic disease noted. ORBITS: No acute abnormality. SINUSES: No acute abnormality. SOFT TISSUES AND SKULL: No convincing abnormal hyperdense vessel. Calcified atherosclerosis present about the skull base. EEG leads overlying the scalp. No acute soft tissue abnormality. No skull fracture. IMPRESSION: 1. Evolving right MCA territory infarct with associated loss of gray-white matter differentiation and cortical sulcation, with persistent superimposed contrast scanning. Overall, the degree of gray-white matter differentiation loss appears increased as compared to prior MRI performed earlier the same day, potentially reflecting interval expansion of the right MCA territory infarct. Difficult to be certain of this finding given the superimposed contrast staining. Correlation with repeat brain MRI and could be performed for further evaluation as warranted. 2. No acute intracranial hemorrhage. 3. No other new intracranial abnormality. 4. Underlying Advanced chronic microvascular ischemic changes. Electronically signed by: Morene Hoard MD 08/13/2024 07:36 PM EST RP Workstation: [id number]   ECHOCARDIOGRAM COMPLETE Result Date: 08/13/2024    ECHOCARDIOGRAM REPORT   Patient Name:    Norma Coleman Date of Exam: 08/13/2024 Medical Rec #:  [id number]         Height:       63.0 in Accession #:    [id number]        Weight:       132.3 lb Date of Birth:  [birth year]        BSA:          1.622 m Patient Age:    89 years          BP:           142/45 mmHg Patient Gender: F                 HR:           73 bpm. Exam Location:  Inpatient Procedure: 2D Echo, Cardiac Doppler and Color Doppler (Both Spectral and Color            Flow Doppler were utilized during procedure). Indications:    Stroke  History:        Patient has no prior history of Echocardiogram examinations.                 Cardiomegaly; Risk Factors:Dyslipidemia, Hypertension and                 Diabetes.  Sonographer:    Sherlean Dubin Referring Phys: [id number] CORTNEY E DE LA TORRE  Sonographer Comments: Image acquisition challenging due to respiratory motion and Image acquisition challenging due to patient body habitus. IMPRESSIONS  1. Left ventricular ejection  fraction, by estimation, is 60 to 65%. The left ventricle has normal function. The left ventricle has no regional wall motion abnormalities. There is severe concentric left ventricular hypertrophy.  2. Right ventricular systolic function is normal. The right ventricular size is normal.  3. Left atrial size was moderately dilated.  4. The mitral valve is degenerative. No evidence of mitral valve regurgitation. No evidence of mitral stenosis. Moderate to severe mitral annular calcification.  5. The aortic valve was not well visualized. Aortic valve regurgitation is not visualized. No aortic stenosis is present.  6. The inferior vena cava is normal in size with greater than 50% respiratory variability, suggesting right atrial pressure of 3 mmHg. Comparison(s): No prior Echocardiogram. FINDINGS  Left Ventricle: Left ventricular ejection fraction, by estimation, is 60 to 65%. The left ventricle has normal function. The left ventricle has no regional wall motion abnormalities. The left  ventricular internal cavity size was normal in size. There is  severe concentric left ventricular hypertrophy. Left ventricular diastolic function could not be evaluated due to mitral annular calcification (moderate or greater). Right Ventricle: The right ventricular size is normal. No increase in right ventricular wall thickness. Right ventricular systolic function is normal. Left Atrium: Left atrial size was moderately dilated. Right Atrium: Right atrial size was normal in size. Pericardium: There is no evidence of pericardial effusion. Mitral Valve: The mitral valve is degenerative in appearance. Moderate to severe mitral annular calcification. No evidence of mitral valve regurgitation. No evidence of mitral valve stenosis. Tricuspid Valve: The tricuspid valve is normal in structure. Tricuspid valve regurgitation is mild . No evidence of tricuspid stenosis. Aortic Valve: The aortic valve was not well visualized. Aortic valve regurgitation is not visualized. No aortic stenosis is present. Aortic valve mean gradient measures 8.5 mmHg. Aortic valve peak gradient measures 17.1 mmHg. Aortic valve area, by VTI measures 1.73 cm. Pulmonic Valve: The pulmonic valve was not well visualized. Pulmonic valve regurgitation is trivial. No evidence of pulmonic stenosis. Aorta: The aortic root and ascending aorta are structurally normal, with no evidence of dilitation. Venous: The inferior vena cava is normal in size with greater than 50% respiratory variability, suggesting right atrial pressure of 3 mmHg. IAS/Shunts: No atrial level shunt detected by color flow Doppler.  LEFT VENTRICLE PLAX 2D LVIDd:         5.20 cm   Diastology LVIDs:         3.50 cm   LV e' medial:    7.94 cm/s LV PW:         1.15 cm   LV E/e' medial:  16.5 LV IVS:        1.00 cm   LV e' lateral:   7.40 cm/s LVOT diam:     2.00 cm   LV E/e' lateral: 17.7 LV SV:         76 LV SV Index:   47 LVOT Area:     3.14 cm  RIGHT VENTRICLE             IVC RV Basal  diam:  3.10 cm     IVC diam: 1.60 cm RV Mid diam:    2.00 cm RV S prime:     12.80 cm/s  PULMONARY VEINS TAPSE (M-mode): 1.8 cm      Diastolic Velocity: 24.80 cm/s                             S/D Velocity:  1.70                             Systolic Velocity:  42.70 cm/s LEFT ATRIUM             Index        RIGHT ATRIUM           Index LA diam:        4.55 cm 2.81 cm/m   RA Area:     16.70 cm LA Vol (A2C):   48.1 ml 29.65 ml/m  RA Volume:   38.70 ml  23.86 ml/m LA Vol (A4C):   82.2 ml 50.68 ml/m LA Biplane Vol: 66.7 ml 41.12 ml/m  AORTIC VALVE AV Area (Vmax):    1.70 cm AV Area (Vmean):   1.76 cm AV Area (VTI):     1.73 cm AV Vmax:           206.50 cm/s AV Vmean:          134.000 cm/s AV VTI:            0.441 m AV Peak Grad:      17.1 mmHg AV Mean Grad:      8.5 mmHg LVOT Vmax:         112.00 cm/s LVOT Vmean:        75.200 cm/s LVOT VTI:          0.244 m LVOT/AV VTI ratio: 0.55  AORTA Ao Root diam: 3.30 cm Ao Asc diam:  2.90 cm MITRAL VALVE                TRICUSPID VALVE MV Area (PHT): 3.16 cm     TR Peak grad:   29.2 mmHg MV Decel Time: 240 msec     TR Vmax:        270.00 cm/s MV E velocity: 131.00 cm/s MV A velocity: 81.50 cm/s   SHUNTS MV E/A ratio:  1.61         Systemic VTI:  0.24 m                             Systemic Diam: 2.00 cm Franck Azobou Tonleu Electronically signed by Joelle Cedars Tonleu Signature Date/Time: 08/13/2024/1:20:09 PM    Final    Medications:  clevidipine  Stopped (08/13/24 0116)   sodium chloride  Stopped (08/14/24 0243)   valproate sodium       aspirin  EC  81 mg Oral Daily   Or   aspirin   300 mg Rectal Daily   atorvastatin   40 mg Oral Daily   Chlorhexidine  Gluconate Cloth  6 each Topical Q0600   Chlorhexidine  Gluconate Cloth  6 each Topical Q0600   darbepoetin (ARANESP ) injection - DIALYSIS  100 mcg Subcutaneous Q Fri-1800   doxercalciferol   6 mcg Intravenous Q T,Th,Sa-HD   heparin   5,000 Units Subcutaneous Q8H   insulin  aspart  0-6 Units Subcutaneous Q4H    levothyroxine   75 mcg Oral QAC breakfast   sodium bicarbonate   650 mg Oral BID   sodium chloride  flush  3 mL Intravenous Once   Dialysis Orders:  AF MWF 3:15 350/600 EDW 57.5 kg 2K/2Ca AVF 16g Heparin  3000 +2000 midrun No ESA orders. 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 C/b new seizures.  Management per neuro.  Doing well.  2.  ESRD.  HD MWF. Missed Monday d/t illness. Had HD 12/3.  HD today.  3.  Volume/BP. No gross volume on exam. UF as able. Was NPO now currently w diet- not needing much UF. No outpt antiHTN meds - BP here acceptable.  4.  Anemia.Hb down trending to 7s, iron replete.  Has hematoma at femoral access site but looks stable and no abd pain to suggest RP bleed.  Resumed ESA, dosed 12/5.    5.  Metabolic bone disease.  Follow Ca/Phos trend. Resume home meds when eating more.      [name] MD 08/15/2024, 10:44 AM  [facility] Pager: [phone]

## 2024-08-15 NOTE — Progress Notes (Signed)
 Speech Language Pathology Treatment: Dysphagia  Patient Details Name: Norma Coleman MRN: [id number] DOB: 17-Oct-1932 Today's Date: 08/15/2024 Time: 0921-0936 SLP Time Calculation (min) (ACUTE ONLY): 15 min  Assessment / Plan / Recommendation Clinical Impression  PLAN: advance diet to regular solids;thin liquids. Give meds whole with water.  Provide assist with tray set-up and feeding as needed.   Pt with improved mentation and participation.  Self fed crackers, puree, and drank thin liquids from a straw with no concerns for aspiration. No difficulty with mastication.  Motivation to eat may be the primary barrier.  SLP will f/u x1 to ensure safety with new diet.  D/W RN.    HPI HPI: 89 yo female presents with L side weakness, L hemianopsia and dysarthria after falling out of bed and striking her head. NIH 10 CT (+) R MCA occlusion, R cerebellopontine mass compatible with meningioma, R MCA infarct involving frontoparietal operculum.  12/2 R M1 thrombectomy TICI 3. Complicated by new onset seizure activity, activity localizes to the Left hemisphere. MRI 12/3 also showed L parietal lobe and R frontal acute infarct PMH includes: ESRD on HD, HTN, DM2, CHF, DVT. Lives at home with dtr; HD M/W/F.      SLP Plan  Continue with current plan of care        Swallow Evaluation Recommendations   Recommendations: PO diet PO Diet Recommendation: Regular;Thin liquids (Level 0) Liquid Administration via: Cup;Straw Medication Administration: Whole meds with liquid Supervision: Patient able to self-feed;Set-up assistance for safety Postural changes: Position pt fully upright for meals Oral care recommendations: Oral care BID (2x/day)     Recommendations                     Oral care BID     Dysphagia, unspecified (R13.10)     Continue with current plan of care    Norma Jesus L. Vona, MA CCC/SLP Clinical Specialist - Acute Care SLP Acute Rehabilitation Services Office number  [phone]  Norma Coleman  08/15/2024, 9:54 AM

## 2024-08-16 LAB — BASIC METABOLIC PANEL WITH GFR
Anion gap: 16 — ABNORMAL HIGH (ref 5–15)
BUN: 27 mg/dL — ABNORMAL HIGH (ref 8–23)
CO2: 24 mmol/L (ref 22–32)
Calcium: 8.8 mg/dL — ABNORMAL LOW (ref 8.9–10.3)
Chloride: 94 mmol/L — ABNORMAL LOW (ref 98–111)
Creatinine, Ser: 4.11 mg/dL — ABNORMAL HIGH (ref 0.44–1.00)
GFR, Estimated: 10 mL/min — ABNORMAL LOW (ref >60)
Glucose, Bld: 76 mg/dL (ref 70–99)
Potassium: 3.9 mmol/L (ref 3.5–5.1)
Sodium: 134 mmol/L — ABNORMAL LOW (ref 135–145)

## 2024-08-16 LAB — GLUCOSE, CAPILLARY
Glucose-Capillary: 101 mg/dL — ABNORMAL HIGH (ref 70–99)
Glucose-Capillary: 113 mg/dL — ABNORMAL HIGH (ref 70–99)
Glucose-Capillary: 73 mg/dL (ref 70–99)
Glucose-Capillary: 79 mg/dL (ref 70–99)
Glucose-Capillary: 81 mg/dL (ref 70–99)
Glucose-Capillary: 87 mg/dL (ref 70–99)

## 2024-08-16 LAB — HEMOGLOBIN AND HEMATOCRIT, BLOOD
HCT: 24.6 % — ABNORMAL LOW (ref 36.0–46.0)
Hemoglobin: 8.3 g/dL — ABNORMAL LOW (ref 12.0–15.0)

## 2024-08-16 LAB — CBC
HCT: 21.2 % — ABNORMAL LOW (ref 36.0–46.0)
Hemoglobin: 6.7 g/dL — CL (ref 12.0–15.0)
MCH: 30.9 pg (ref 26.0–34.0)
MCHC: 31.6 g/dL (ref 30.0–36.0)
MCV: 97.7 fL (ref 80.0–100.0)
Platelets: 149 K/uL — ABNORMAL LOW (ref 150–400)
RBC: 2.17 MIL/uL — ABNORMAL LOW (ref 3.87–5.11)
RDW: 14.2 % (ref 11.5–15.5)
WBC: 4.2 K/uL (ref 4.0–10.5)
nRBC: 0 % (ref 0.0–0.2)

## 2024-08-16 LAB — HEPATITIS B SURFACE ANTIBODY, QUANTITATIVE: Hep B S AB Quant (Post): 245 m[IU]/mL

## 2024-08-16 LAB — PREPARE RBC (CROSSMATCH)

## 2024-08-16 LAB — VALPROIC ACID LEVEL: Valproic Acid Lvl: 51 ug/mL (ref 50–100)

## 2024-08-16 MED ORDER — VALPROATE SODIUM 100 MG/ML IV SOLN
500.0000 mg | Freq: Three times a day (TID) | INTRAVENOUS | Status: DC
  Administered 2024-08-16 – 2024-08-17 (×3): 500 mg via INTRAVENOUS
  Filled 2024-08-16: qty 500
  Filled 2024-08-16 (×4): qty 5
  Filled 2024-08-16: qty 500
  Filled 2024-08-16: qty 5

## 2024-08-16 MED ORDER — SODIUM CHLORIDE 0.9% IV SOLUTION
Freq: Once | INTRAVENOUS | Status: AC
  Administered 2024-08-16: 75 mL via INTRAVENOUS

## 2024-08-16 NOTE — Progress Notes (Signed)
 [facility] Progress Note   Subjective:  Had HD yesterday, no issues. UF .  Hb 6.8 this AM and neurology asked about transfusing - ok to proceed on floor.  No bleeding, the groin hematoma from access site for thrombectomy has spread to an ecchymosis but doesn't appear to have rebled, denies abd and flank pain.   Objective Vitals:   08/15/24 2002 08/15/24 2353 08/16/24 0501 08/16/24 0750  BP: (!) 154/60 134/66 (!) 158/56 (!) 157/64  Pulse: 99 88 91 94  Resp: 19 15 19 16   Temp: 99.2 F (37.3 C) 98.9 F (37.2 C) 99.2 F (37.3 C) 99.2 F (37.3 C)  TempSrc: Oral  Oral Oral  SpO2: 97% 97% 96% 96%  Weight:      Height:       Physical Exam General: awake alert and interactive Heart: RRR Lungs: clear on RA Abdomen:soft Extremities: no edema Dialysis Access: LUE AVF +t/b  Additional Objective Labs: Basic Metabolic Panel: Recent Labs  Lab 08/14/24 0401 08/15/24 0338 08/16/24 0152  NA 132* 133* 134*  K 4.3 4.3 3.9  CL 95* 92* 94*  CO2 24 23 24   GLUCOSE 77 77 76  BUN 31* 52* 27*  CREATININE 5.66* 7.47* 4.11*  CALCIUM  8.3* 8.2* 8.8*  PHOS 5.7*  --   --    Liver Function Tests: Recent Labs  Lab 08/12/24 1233 08/13/24 0247  AST 17 22  ALT 9 8  ALKPHOS 332* 275*  BILITOT 0.8 0.7  PROT 6.6 5.4*  ALBUMIN  3.5 2.9*   No results for input(s): LIPASE, AMYLASE in the last 168 hours. CBC: Recent Labs  Lab 08/12/24 1233 08/12/24 1235 08/13/24 0247 08/14/24 0401 08/15/24 0338 08/16/24 0152  WBC 9.8  --  8.7 9.9 5.5 4.2  NEUTROABS 8.4*  --   --   --   --   --   HGB 10.5*   < > 8.4* 7.7* 7.0* 6.7*  HCT 34.3*   < > 26.8* 23.8* 21.3* 21.2*  MCV 103.3*  --  100.4* 96.7 97.3 97.7  PLT 213  --  202 177 165 149*   < > = values in this interval not displayed.   Blood Culture    Component Value Date/Time   SDES  08/05/2022 2230    BLOOD SITE NOT SPECIFIED Performed at [facility], [address] [place], [place] [address]     SPECREQUEST  08/05/2022 2230    BOTTLES DRAWN AEROBIC AND ANAEROBIC Blood Culture adequate volume Performed at [facility], [address], [place], [place] [address]    CULT  08/05/2022 2230    NO GROWTH 5 DAYS Performed at [facility], [address] [place], [place] [address]    REPTSTATUS 08/11/2022 FINAL 08/05/2022 2230    Cardiac Enzymes: No results for input(s): CKTOTAL, CKMB, CKMBINDEX, TROPONINI in the last 168 hours. CBG: Recent Labs  Lab 08/15/24 1722 08/15/24 1959 08/15/24 2352 08/16/24 0457 08/16/24 0638  GLUCAP 80 84 87 73 79   Iron Studies:  Recent Labs    08/15/24 0338  IRON 140  TIBC 176*   @lablastinr3 @ Studies/Results: No results found.  Medications:  sodium chloride  Stopped (08/14/24 0243)   valproate sodium       sodium chloride    Intravenous Once   aspirin  EC  81 mg Oral Daily   Or   aspirin   300 mg Rectal Daily   atorvastatin   40 mg Oral Daily   Chlorhexidine  Gluconate Cloth  6 each Topical Q0600   Chlorhexidine  Gluconate Cloth  6 each Topical Q0600   darbepoetin (ARANESP ) injection - DIALYSIS  100 mcg Subcutaneous Q Fri-1800   doxercalciferol   6 mcg Intravenous Q T,Th,Sa-HD   feeding supplement (NEPRO CARB STEADY)  237 mL Oral BID BM   insulin  aspart  0-6 Units Subcutaneous Q4H   levothyroxine   75 mcg Oral QAC breakfast   sevelamer  carbonate  1,600 mg Oral TID WC   sodium bicarbonate   650 mg Oral BID   sodium chloride  flush  3 mL Intravenous Once   Dialysis Orders:  AF MWF 3:15 350/600 EDW 57.5 kg 2K/2Ca AVF 16g Heparin  3000 +2000 midrun No ESA orders. 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 C/b new seizures.  Management per neuro.  Doing well.  2.  ESRD.  HD MWF. Missed Monday d/t illness. Had HD 12/5 last. Next Mondsay.  3.  Volume/BP. No gross volume on exam. Think she'll tolerate 2u pRBC today planned by neurology  but if develops s/s overload can do HD  4.  Anemia.Hb down trending to 6s, iron replete.  Transfusing today per primary. Has hematoma at femoral access site but looks stable and no abd pain to suggest RP bleed.  Resumed ESA, dosed 12/5.    5.  Metabolic bone disease.  Follow Ca/Phos trend. On hectorol  and renvela  per home doses; 12/4 phos 5.7.      Norma Barters MD 08/16/2024, 10:50 AM  [facility] Pager: [phone]

## 2024-08-16 NOTE — Progress Notes (Addendum)
 STROKE TEAM PROGRESS NOTE    SIGNIFICANT HOSPITAL EVENTS 12/2 presented with acute left side weakness. NO TNK due to outside window, CTA with R MCA occlusion, taken for mechanical thrombectomy  12/4: Repeat CT 12/3 raised concerns about cytotoxic edema in the right MCA territory suggesting right MCA reocclusion however MRI scan showed stable appearance of the right MCA infarct without significant cytotoxic edema and MRA brain showed patent right MCA  12/5: HD, transferred out of ICU  INTERIM HISTORY/SUBJECTIVE  On AM labs, patients Hgb dropped to  6.7, no signs of bleeding. 2units PRBC transfusion ordered.2nd unit running in room, on assessment.   Sitting up in bed, Awake and alert, slight dysarthria, no drift, facial droop has improved.  Family at bedside.   CBC    Component Value Date/Time   WBC 4.2 08/16/2024 0152   RBC 2.17 (L) 08/16/2024 0152   HGB 6.7 (LL) 08/16/2024 0152   HCT 21.2 (L) 08/16/2024 0152   PLT 149 (L) 08/16/2024 0152   MCV 97.7 08/16/2024 0152   MCH 30.9 08/16/2024 0152   MCHC 31.6 08/16/2024 0152   RDW 14.2 08/16/2024 0152   LYMPHSABS 0.8 08/12/2024 1233   MONOABS 0.5 08/12/2024 1233   EOSABS 0.0 08/12/2024 1233   BASOSABS 0.0 08/12/2024 1233    BMET    Component Value Date/Time   NA 134 (L) 08/16/2024 0152   K 3.9 08/16/2024 0152   CL 94 (L) 08/16/2024 0152   CO2 24 08/16/2024 0152   GLUCOSE 76 08/16/2024 0152   BUN 27 (H) 08/16/2024 0152   CREATININE 4.11 (H) 08/16/2024 0152   CALCIUM  8.8 (L) 08/16/2024 0152   GFRNONAA 10 (L) 08/16/2024 0152    IMAGING past 24 hours No results found.   Vitals:   08/15/24 2353 08/16/24 0501 08/16/24 0750 08/16/24 1150  BP: 134/66 (!) 158/56 (!) 157/64 (!) 158/64  Pulse: 88 91 94 (!) 102  Resp: 15 19 16 18   Temp: 98.9 F (37.2 C) 99.2 F (37.3 C) 99.2 F (37.3 C) 98.4 F (36.9 C)  TempSrc:  Oral Oral Oral  SpO2: 97% 96% 96% 98%  Weight:      Height:         PHYSICAL EXAM General: critically  ill  Psych:  Mood and affect appropriate for situation CV: Regular rate and rhythm on monitor Respiratory:  Regular, unlabored respirations on nasal cannula  GI: Abdomen soft and nontender   NEURO:  Mental Status: She is awake and alert. Oriented to self, age, place, some of situation. No aphasia, slight dysarthria  Cranial Nerves:  II: PERRL. Visual fields with left hemianopia III, IV, VI: EOMI. Eyelids elevate symmetrically.  V: Sensation is intact to light touch and symmetrical to face.  VII: left facial droop, improved VIII: hearing intact to voice. IX, X: Palate elevates symmetrically. Phonation is normal.  KP:Dynloizm shrug 5/5. XII: tongue is midline without fasciculations. Motor: right side is purposeful  LUE: 4/5, no drift LLE: 4-/5, no drift Tone: is normal and bulk is normal Sensation- responds to noxious stimuli    Coordination: unable to assess Gait- deferred  Most Recent NIH: 6  ASSESSMENT/PLAN  Norma Coleman is a 89 y.o. female with history of  hx of end-stage renal disease on dialysis, hypertension, hyperlipidemia and hypothyroidism who presents with acute onset left-sided weakness, left hemianopsia and dysarthria.   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 Small vessel disease.  CTA head & neck Occlusion of the right M2 inferior division at its origin.  CT perfusion 0/43 with pseudonormalization so the core was underestimated  MRI: Small acute right MCA infarct. Severe chronic small vessel ischemic disease. Repeat CT head: Mild hyperdensity over the right MCA territory, likely contrast staining following neurointerventional procedure.   Repeat MRI scan brain shows stable ischemic appearance of the right MCA infarct without significant cytotoxic edema 2D Echo  EF 60-65%  Recommend  30-day heart monitor after discharge-cardiology aware LDL 35 HgbA1c 5.5 VTE prophylaxis - heparin  SQ No antithrombotic prior to admission, continue asa 81mg . No DAPT for now due to severe anemia.  Therapy recommendations:  Long-term facility Disposition:  pending   New onset focal seizures  Seizure precautions  Loaded with Valproic  acid followed by 300->400 Q8h  VPA level low 51-> pending Increased to 500mg  Q8H LTM EEG no seizure  Severe Anemia Hgb 10.5-11.6-8.4-7.7-7.0-6.7-PRBC No signs of bleeding Continue daily CBC  Continue bleeding precautions  Hypertension Home meds:  none Stable Long term BP goal normotensive  Hyperlipidemia Home meds:  atorvastatin  40mg ,  resumed in hospital LDL 35, goal < 70 Continue statin at discharge  Hypothyroidism  Continue home levothyroxine    ESRD on HD MWF Nephro following  HD 12/5 If FVO from PRBC transfusions, can do HD today per Nephrology Continue AM labs  Dysphagia Patient has post-stroke dysphagia, SLP consulted On dys 1 and thin liquid Advance diet as tolerated  Other Stroke Risk Factors Advanced Age  Other medical issues 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  Hospital day # 4   Pt seen by Neuro NP/APP and later by MD. Note/plan to be edited by MD as needed.    [name], DNP [facility] Please use AMION for contact information & EPIC for messaging.  ATTENDING NOTE: I reviewed above note and agree with the assessment and plan. Pt was seen and examined.   Daughter and RN are at the bedside. Pt lying in bed, awake, alert, eyes open, orientated to age, place, time and people but with psychomotor slowing and self correction. No aphasia but paucity of speech, following most simple commands. Able to name 3/4 and repeat simple sentences. No gaze palsy, tracking bilaterally, visual field full, PERRL. No facial droop. Tongue midline. Bilateral UEs 4/5, no  drift. Bilaterally LEs 3/5, no drift. Sensation symmetrical bilaterally, b/l FTN intact grossly but slow, gait not tested.   For detailed assessment and plan, please refer to above as I have made changes wherever appropriate.   Ary Cummins, MD PhD Stroke Neurology 08/16/2024 4:16 PM

## 2024-08-16 NOTE — Plan of Care (Signed)
 Hgb continues to decrease and is now 6.7 from over 10 on 12/2. Discussed with Nephrology. Will type and cross and then transfuse 1 unit PRBC. Due to risk of volume overload in this ESRD patient on HD, Nephrology will see her in the AM.   I have verbally obtained consent for the transfusion from the patient by telephone. Consent witnessed by RN.   Electronically signed: Dr. Isaura Schiller

## 2024-08-17 LAB — GLUCOSE, CAPILLARY
Glucose-Capillary: 89 mg/dL (ref 70–99)
Glucose-Capillary: 87 mg/dL (ref 70–99)
Glucose-Capillary: 139 mg/dL — ABNORMAL HIGH (ref 70–99)
Glucose-Capillary: 96 mg/dL (ref 70–99)
Glucose-Capillary: 113 mg/dL — ABNORMAL HIGH (ref 70–99)
Glucose-Capillary: 103 mg/dL — ABNORMAL HIGH (ref 70–99)

## 2024-08-17 LAB — CBC
HCT: 23.8 % — ABNORMAL LOW (ref 36.0–46.0)
Hemoglobin: 7.9 g/dL — ABNORMAL LOW (ref 12.0–15.0)
MCH: 31.9 pg (ref 26.0–34.0)
MCHC: 33.2 g/dL (ref 30.0–36.0)
MCV: 96 fL (ref 80.0–100.0)
Platelets: 137 K/uL — ABNORMAL LOW (ref 150–400)
RBC: 2.48 MIL/uL — ABNORMAL LOW (ref 3.87–5.11)
RDW: 14.6 % (ref 11.5–15.5)
WBC: 4.2 K/uL (ref 4.0–10.5)
nRBC: 0 % (ref 0.0–0.2)

## 2024-08-17 LAB — BASIC METABOLIC PANEL WITH GFR
Anion gap: 11 (ref 5–15)
BUN: 45 mg/dL — ABNORMAL HIGH (ref 8–23)
CO2: 28 mmol/L (ref 22–32)
Calcium: 8.3 mg/dL — ABNORMAL LOW (ref 8.9–10.3)
Chloride: 96 mmol/L — ABNORMAL LOW (ref 98–111)
Creatinine, Ser: 5.76 mg/dL — ABNORMAL HIGH (ref 0.44–1.00)
GFR, Estimated: 7 mL/min — ABNORMAL LOW (ref >60)
Glucose, Bld: 82 mg/dL (ref 70–99)
Potassium: 3.7 mmol/L (ref 3.5–5.1)
Sodium: 135 mmol/L (ref 135–145)

## 2024-08-17 LAB — VALPROIC ACID LEVEL: Valproic Acid Lvl: 115 ug/mL — ABNORMAL HIGH (ref 50–100)

## 2024-08-17 MED ORDER — DIVALPROEX SODIUM 250 MG PO DR TAB
500.0000 mg | DELAYED_RELEASE_TABLET | Freq: Three times a day (TID) | ORAL | Status: DC
  Administered 2024-08-17 – 2024-08-18 (×3): 500 mg via ORAL
  Filled 2024-08-17 (×3): qty 2

## 2024-08-17 MED ORDER — ACETAMINOPHEN 325 MG PO TABS
650.0000 mg | ORAL_TABLET | ORAL | Status: DC | PRN
  Administered 2024-08-17 – 2024-08-20 (×4): 650 mg via ORAL
  Filled 2024-08-17: qty 2

## 2024-08-17 NOTE — Plan of Care (Signed)
  Problem: Activity: Goal: Ability to return to baseline activity level will improve Outcome: Progressing   Problem: Education: Goal: Knowledge of disease or condition will improve Outcome: Progressing   Problem: Pain Managment: Goal: General experience of comfort will improve and/or be controlled Outcome: Progressing   Problem: Safety: Goal: Ability to remain free from injury will improve Outcome: Progressing

## 2024-08-17 NOTE — Progress Notes (Addendum)
 STROKE TEAM PROGRESS NOTE    SIGNIFICANT HOSPITAL EVENTS 12/2 presented with acute left side weakness. NO TNK due to outside window, CTA with R MCA occlusion, taken for mechanical thrombectomy  12/4: Repeat CT 12/3 raised concerns about cytotoxic edema in the right MCA territory suggesting right MCA reocclusion however MRI scan showed stable appearance of the right MCA infarct without significant cytotoxic edema and MRA brain showed patent right MCA  12/5: HD, transferred out of ICU  12/6: On AM labs, patients Hgb dropped to  6.7, no signs of bleeding.  Post-transfuion, Hgb 8.3  INTERIM HISTORY/SUBJECTIVE  Hgb this AM 7.9. Continue to hold DAPT, on aspirin .  Plan for HD tomorrow per Nephrology.   Stable neurological exam. Pending discharge planning for LTF.  CBC    Component Value Date/Time   WBC 4.2 08/17/2024 0615   RBC 2.48 (L) 08/17/2024 0615   HGB 7.9 (L) 08/17/2024 0615   HCT 23.8 (L) 08/17/2024 0615   PLT 137 (L) 08/17/2024 0615   MCV 96.0 08/17/2024 0615   MCH 31.9 08/17/2024 0615   MCHC 33.2 08/17/2024 0615   RDW 14.6 08/17/2024 0615   LYMPHSABS 0.8 08/12/2024 1233   MONOABS 0.5 08/12/2024 1233   EOSABS 0.0 08/12/2024 1233   BASOSABS 0.0 08/12/2024 1233    BMET    Component Value Date/Time   NA 134 (L) 08/16/2024 0152   K 3.9 08/16/2024 0152   CL 94 (L) 08/16/2024 0152   CO2 24 08/16/2024 0152   GLUCOSE 76 08/16/2024 0152   BUN 27 (H) 08/16/2024 0152   CREATININE 4.11 (H) 08/16/2024 0152   CALCIUM  8.8 (L) 08/16/2024 0152   GFRNONAA 10 (L) 08/16/2024 0152    IMAGING past 24 hours No results found.   Vitals:   08/16/24 1945 08/16/24 2333 08/17/24 0356 08/17/24 0813  BP: (!) 143/62 134/61 131/69 (!) 132/53  Pulse: 89 82 86 84  Resp: 18 18 18 16   Temp: 97.6 F (36.4 C) 98.6 F (37 C) 98.2 F (36.8 C) 97.8 F (36.6 C)  TempSrc: Oral Oral  Oral  SpO2: 96% 98% 97% 95%  Weight:      Height:        PHYSICAL EXAM General: critically ill  Psych:   Mood and affect appropriate for situation CV: Regular rate and rhythm on monitor Respiratory:  Regular, unlabored respirations on nasal cannula  GI: Abdomen soft and nontender  NEURO:  Mental Status: Norma Coleman is awake and alert. Oriented to self, age, place, some of situation. No aphasia, slight dysarthria--improved.   Cranial Nerves:  II: PERRL. Visual fields with left hemianopia III, IV, VI: EOMI. Eyelids elevate symmetrically.  V: Sensation is intact to light touch and symmetrical to face.  VII: left facial droop, improved VIII: hearing intact to voice. IX, X: Palate elevates symmetrically. Phonation is normal.  KP:Dynloizm shrug 5/5. XII: tongue is midline without fasciculations. Motor: right side is purposeful  LUE: 4/5, no drift LLE: 4-/5, no drift Tone: is normal and bulk is normal Sensation- responds to noxious stimuli    Coordination: unable to assess Gait- deferred  Most Recent NIH: 6  ASSESSMENT/PLAN  Norma Coleman is a 89 y.o. female with history of  hx of end-stage renal disease on dialysis, hypertension, hyperlipidemia and hypothyroidism who presents with acute onset left-sided weakness, left hemianopsia and dysarthria.   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 Small vessel disease.  CTA head & neck Occlusion of the right M2 inferior division at its origin.  CT perfusion 0/43 with pseudonormalization so the core was underestimated  MRI: Small acute right MCA infarct. Severe chronic small vessel ischemic disease. Repeat CT head: Mild hyperdensity over the right MCA territory, likely contrast staining following neurointerventional procedure.   Repeat MRI scan brain shows stable ischemic appearance of the right MCA infarct without significant cytotoxic edema 2D Echo  EF 60-65%  Recommend 30-day  heart monitor after discharge-cardiology aware LDL 35 HgbA1c 5.5 VTE prophylaxis - heparin  SQ No antithrombotic prior to admission, continue asa 81mg . Continue to hold DAPT for now due to anemia.  Therapy recommendations:  Long-term facility Disposition:  pending   New onset focal seizures  Seizure precautions  Loaded with Valproic  acid followed by 300->400->500 Q8h  VPA level low 51-> 115 -> pending Not a trough level. 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 Hgb 10.5-11.6-8.4-7.7-7.0-6.7-PRBCx2-8.3-7.9 No signs of bleeding Continue daily CBC  Continue bleeding precautions  Hypertension Home meds:  none Stable Long term BP goal normotensive  Hyperlipidemia Home meds:  atorvastatin  40mg ,  resumed in hospital LDL 35, goal < 70 Continue statin at discharge  Hypothyroidism  Continue home levothyroxine    ESRD on HD MWF Nephro following  HD 12/5, next planned for 12/8 Continue AM labs  Dysphagia Patient has post-stroke dysphagia, SLP consulted On dys 1 and thin liquid Advance diet as tolerated  Other Stroke Risk Factors Advanced Age  Other medical issues 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  Hospital day # 5   Norma Coleman seen by Neuro NP/APP and later by MD. Note/plan to be edited by MD as needed.    [name], DNP [facility] Please use AMION for contact information & EPIC for messaging.  ATTENDING NOTE: I reviewed above note and agree with the assessment and plan. Norma Coleman.   Norma Coleman are at the bedside. Norma Coleman lying in bed, eyes open, awake alert, neuro stable unchanged. Norma Coleman depakote  level was high, however, that was just given the morning IV depakote  dose. Will switch IV to po and repeat depakote  level in am. Hb improved after PRBC transfusion, today 7.9,  continue ASA for now, continue to hold plavix . Continue statin. Pending CBC and depakote  level in am. SW and CM need to work on placement in am too.   For detailed assessment and plan, please refer to above as I have made changes wherever appropriate.   Ary Cummins, MD PhD Stroke Neurology 08/17/2024 1:15 PM

## 2024-08-17 NOTE — Progress Notes (Signed)
 [facility] Progress Note   Subjective:  Seen in room, no new issues.  S/p blood transfusion yesterday and post Hb 8.3 - AM labs for today pending.  Remains on RA.  No abd or flank pain.  Per RN daughter said she's on TTS outpt HD schedule though in reviewing outpt records she's listed as MWF at her clinic.   Objective Vitals:   08/16/24 1530 08/16/24 1945 08/16/24 2333 08/17/24 0356  BP: 132/60 (!) 143/62 134/61 131/69  Pulse: 80 89 82 86  Resp: 16 18 18 18   Temp: 98.1 F (36.7 C) 97.6 F (36.4 C) 98.6 F (37 C) 98.2 F (36.8 C)  TempSrc: Oral Oral Oral   SpO2: 99% 96% 98% 97%  Weight:      Height:       Physical Exam General: awake alert and interactive Heart: RRR Lungs: clear on RA Abdomen:soft Extremities: no edema Neuro:  oriented to self, hosp, 2025 but hazy on details, very mild dysarthria noted Dialysis Access: LUE AVF +t/b  Additional Objective Labs: Basic Metabolic Panel: Recent Labs  Lab 08/14/24 0401 08/15/24 0338 08/16/24 0152  NA 132* 133* 134*  K 4.3 4.3 3.9  CL 95* 92* 94*  CO2 24 23 24   GLUCOSE 77 77 76  BUN 31* 52* 27*  CREATININE 5.66* 7.47* 4.11*  CALCIUM  8.3* 8.2* 8.8*  PHOS 5.7*  --   --    Liver Function Tests: Recent Labs  Lab 08/12/24 1233 08/13/24 0247  AST 17 22  ALT 9 8  ALKPHOS 332* 275*  BILITOT 0.8 0.7  PROT 6.6 5.4*  ALBUMIN  3.5 2.9*   No results for input(s): LIPASE, AMYLASE in the last 168 hours. CBC: Recent Labs  Lab 08/12/24 1233 08/12/24 1235 08/13/24 0247 08/14/24 0401 08/15/24 0338 08/16/24 0152 08/16/24 1927  WBC 9.8  --  8.7 9.9 5.5 4.2  --   NEUTROABS 8.4*  --   --   --   --   --   --   HGB 10.5*   < > 8.4* 7.7* 7.0* 6.7* 8.3*  HCT 34.3*   < > 26.8* 23.8* 21.3* 21.2* 24.6*  MCV 103.3*  --  100.4* 96.7 97.3 97.7  --   PLT 213  --  202 177 165 149*  --    < > = values in this interval not displayed.   Blood Culture    Component Value Date/Time   SDES  08/05/2022 2230     BLOOD SITE NOT SPECIFIED Performed at [facility], [address], [place], [place] [address]    SPECREQUEST  08/05/2022 2230    BOTTLES DRAWN AEROBIC AND ANAEROBIC Blood Culture adequate volume Performed at [facility], [address] [place], [place] [address]    CULT  08/05/2022 2230    NO GROWTH 5 DAYS Performed at [facility], [address] [place], [place] [address]    REPTSTATUS 08/11/2022 FINAL 08/05/2022 2230    Cardiac Enzymes: No results for input(s): CKTOTAL, CKMB, CKMBINDEX, TROPONINI in the last 168 hours. CBG: Recent Labs  Lab 08/16/24 1142 08/16/24 1604 08/16/24 2035 08/16/24 2335 08/17/24 0354  GLUCAP 87 101* 113* 81 89   Iron Studies:  Recent Labs    08/15/24 0338  IRON 140  TIBC 176*   @lablastinr3 @ Studies/Results: No results found.  Medications:  sodium chloride  Stopped (08/14/24 0243)   valproate sodium  55 mL/hr at 08/17/24 0558    aspirin  EC  81  mg Oral Daily   Or   aspirin   300 mg Rectal Daily   atorvastatin   40 mg Oral Daily   Chlorhexidine  Gluconate Cloth  6 each Topical Q0600   Chlorhexidine  Gluconate Cloth  6 each Topical Q0600   darbepoetin (ARANESP ) injection - DIALYSIS  100 mcg Subcutaneous Q Fri-1800   doxercalciferol   6 mcg Intravenous Q T,Th,Sa-HD   feeding supplement (NEPRO CARB STEADY)  237 mL Oral BID BM   insulin  aspart  0-6 Units Subcutaneous Q4H   levothyroxine   75 mcg Oral QAC breakfast   sevelamer  carbonate  1,600 mg Oral TID WC   sodium bicarbonate   650 mg Oral BID   sodium chloride  flush  3 mL Intravenous Once   Dialysis Orders:  AF MWF 3:15 350/600 EDW 57.5 kg 2K/2Ca AVF 16g Heparin  3000 +2000 midrun No ESA orders. 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 C/b new seizures.  Management per neuro.  Doing well.  2.  ESRD.  HD MW outpt per [facility] records though daughter told  nurse yesterday she's TTS. Last HD 12/5, next tomorrow.  3.  Volume/BP. BP 130s. No gross volume on exam. UF with HD tomorrow. Standing post weight.  4.  Anemia.Hb down trended to 6s, iron replete.  Transfused 12/6 per primary 1 u with post Hb 8.3, 7.9 today. Has hematoma at femoral access site but looks stable and no abd pain to suggest RP bleed.  Resumed ESA, dosed 12/5.    5.  Metabolic bone disease.  Follow Ca/Phos trend. On hectorol  and renvela  per home doses; 12/4 phos 5.7.   Dispo pending.      [name] MD 08/17/2024, 7:36 AM  [facility] Pager: [phone]

## 2024-08-18 LAB — COMPREHENSIVE METABOLIC PANEL WITH GFR
ALT: 8 U/L (ref 0–44)
AST: 16 U/L (ref 15–41)
Albumin: 2.6 g/dL — ABNORMAL LOW (ref 3.5–5.0)
Alkaline Phosphatase: 212 U/L — ABNORMAL HIGH (ref 38–126)
Anion gap: 16 — ABNORMAL HIGH (ref 5–15)
BUN: 57 mg/dL — ABNORMAL HIGH (ref 8–23)
CO2: 26 mmol/L (ref 22–32)
Calcium: 8.3 mg/dL — ABNORMAL LOW (ref 8.9–10.3)
Chloride: 92 mmol/L — ABNORMAL LOW (ref 98–111)
Creatinine, Ser: 6.89 mg/dL — ABNORMAL HIGH (ref 0.44–1.00)
GFR, Estimated: 5 mL/min — ABNORMAL LOW (ref >60)
Glucose, Bld: 88 mg/dL (ref 70–99)
Potassium: 3.8 mmol/L (ref 3.5–5.1)
Sodium: 134 mmol/L — ABNORMAL LOW (ref 135–145)
Total Bilirubin: 0.9 mg/dL (ref 0.0–1.2)
Total Protein: 4.9 g/dL — ABNORMAL LOW (ref 6.5–8.1)

## 2024-08-18 LAB — GLUCOSE, CAPILLARY
Glucose-Capillary: 108 mg/dL — ABNORMAL HIGH (ref 70–99)
Glucose-Capillary: 93 mg/dL (ref 70–99)
Glucose-Capillary: 179 mg/dL — ABNORMAL HIGH (ref 70–99)
Glucose-Capillary: 103 mg/dL — ABNORMAL HIGH (ref 70–99)

## 2024-08-18 LAB — VALPROIC ACID LEVEL: Valproic Acid Lvl: 113 ug/mL — ABNORMAL HIGH (ref 50–100)

## 2024-08-18 LAB — CBC
HCT: 23.5 % — ABNORMAL LOW (ref 36.0–46.0)
Hemoglobin: 7.9 g/dL — ABNORMAL LOW (ref 12.0–15.0)
MCH: 32.2 pg (ref 26.0–34.0)
MCHC: 33.6 g/dL (ref 30.0–36.0)
MCV: 95.9 fL (ref 80.0–100.0)
Platelets: 151 K/uL (ref 150–400)
RBC: 2.45 MIL/uL — ABNORMAL LOW (ref 3.87–5.11)
RDW: 14.6 % (ref 11.5–15.5)
WBC: 6.2 K/uL (ref 4.0–10.5)
nRBC: 0 % (ref 0.0–0.2)

## 2024-08-18 LAB — AMMONIA: Ammonia: 48 umol/L — ABNORMAL HIGH (ref 9–35)

## 2024-08-18 MED ORDER — DIVALPROEX SODIUM 250 MG PO DR TAB
500.0000 mg | DELAYED_RELEASE_TABLET | Freq: Two times a day (BID) | ORAL | Status: DC
  Administered 2024-08-18 – 2024-08-20 (×4): 500 mg via ORAL
  Filled 2024-08-18 (×4): qty 2

## 2024-08-18 MED ORDER — OXYCODONE HCL 5 MG PO TABS
20.0000 mg | ORAL_TABLET | Freq: Once | ORAL | Status: AC
  Administered 2024-08-18: 20 mg via ORAL
  Filled 2024-08-18: qty 4

## 2024-08-18 MED ORDER — HEPARIN SODIUM (PORCINE) 5000 UNIT/ML IJ SOLN
5000.0000 [IU] | Freq: Three times a day (TID) | INTRAMUSCULAR | Status: DC
  Administered 2024-08-18 – 2024-08-20 (×7): 5000 [IU] via SUBCUTANEOUS
  Filled 2024-08-18 (×7): qty 1

## 2024-08-18 MED ORDER — INSULIN ASPART 100 UNIT/ML IJ SOLN
0.0000 [IU] | Freq: Three times a day (TID) | INTRAMUSCULAR | Status: DC
  Administered 2024-08-18: 1 [IU] via SUBCUTANEOUS
  Filled 2024-08-18: qty 1

## 2024-08-18 MED ORDER — DIVALPROEX SODIUM 250 MG PO DR TAB
250.0000 mg | DELAYED_RELEASE_TABLET | ORAL | Status: DC
  Administered 2024-08-18 – 2024-08-19 (×2): 250 mg via ORAL
  Filled 2024-08-18 (×2): qty 1

## 2024-08-18 NOTE — NC FL2 (Signed)
 [place]  MEDICAID [id number] LEVEL OF CARE FORM     IDENTIFICATION  Patient Name: Norma Coleman Birthdate: 08/29/1933 Sex: female Admission Date (Current Location): 08/12/2024  [facility] and [place] Number:  Producer, Television/film/video and Address:  [facility] [place], [address] [place], [place] [address]      Provider Number: [id number]  Attending Physician Name and Address:  Stroke, Md, MD  Relative Name and Phone Number:       Current Level of Care: Hospital Recommended Level of Care: Skilled Nursing Facility Prior Approval Number:    Date Approved/Denied:   PASRR Number: [id number]  Discharge Plan: SNF    Current Diagnoses: Patient Active Problem List   Diagnosis Date Noted   Stroke (cerebrum) (HCC) 08/12/2024   Malnutrition of moderate degree 09/15/2022   ESRD (end stage renal disease) (HCC) 09/13/2022   Influenza A 09/12/2022   Acute renal failure superimposed on stage 5 chronic kidney disease, not on chronic dialysis (HCC) 08/07/2022   Constipation 08/07/2022   Paget's disease of bone 08/06/2022   Lower GI bleed 08/05/2022   CKD (chronic kidney disease) stage 5, GFR less than 15 ml/min (HCC) 08/05/2022   Essential hypertension 08/05/2022   Hyperlipidemia 08/05/2022   History of DVT (deep vein thrombosis) 08/05/2022   Hypothyroidism 08/05/2022   DM2 (diabetes mellitus, type 2) (HCC) 08/05/2022   Diarrhea 08/05/2022   Chronic diastolic CHF (congestive heart failure) (HCC) 08/05/2022   Cardiomegaly 08/05/2022    Orientation RESPIRATION BLADDER Height & Weight     Self, Time, Situation, Place  Normal Incontinent Weight: 141 lb 12.1 oz (64.3 kg) Height:  5' 3 (160 cm)  BEHAVIORAL SYMPTOMS/MOOD NEUROLOGICAL BOWEL NUTRITION STATUS    Convulsions/Seizures Continent Diet (renal/carb modified, fluid restriction 1200 mL)  AMBULATORY STATUS COMMUNICATION OF NEEDS Skin   Limited Assist Verbally Normal                       Personal Care  Assistance Level of Assistance  Bathing, Dressing, Feeding Bathing Assistance: Limited assistance Feeding assistance: Limited assistance Dressing Assistance: Limited assistance     Functional Limitations Info             SPECIAL CARE FACTORS FREQUENCY  PT (By licensed PT), OT (By licensed OT)     PT Frequency: 5x/wk OT Frequency: 5x/wk            Contractures Contractures Info: Not present    Additional Factors Info  Code Status, Allergies, Insulin  Sliding Scale Code Status Info: DNR Allergies Info: Ibuprofen, Sulfa Antibiotics   Insulin  Sliding Scale Info: see DC summary       Current Medications (08/18/2024):  This is the current hospital active medication list Current Facility-Administered Medications  Medication Dose Route Frequency Provider Last Rate Last Admin   acetaminophen  (TYLENOL ) tablet 650 mg  650 mg Oral Q4H PRN de Clint Kill, Cortney E, NP       Or   acetaminophen  (TYLENOL ) suppository 650 mg  650 mg Rectal Q4H PRN de Clint Kill, Cortney E, NP       acetaminophen  (TYLENOL ) tablet 650 mg  650 mg Oral Q4H PRN Jerri Pfeiffer, MD   650 mg at 08/17/24 2103   aspirin  EC tablet 81 mg  81 mg Oral Daily Waddell Aquas A, NP   81 mg at 08/18/24 9073   Or   aspirin  suppository 300 mg  300 mg Rectal Daily Waddell Aquas A, NP   300 mg  at 08/14/24 0904   atorvastatin  (LIPITOR) tablet 40 mg  40 mg Oral Daily de Clint Kill, Cortney E, NP   40 mg at 08/18/24 9074   Chlorhexidine  Gluconate Cloth 2 % PADS 6 each  6 each Topical Q0600 Khaliqdina, Salman, MD   6 each at 08/17/24 0511   Chlorhexidine  Gluconate Cloth 2 % PADS 6 each  6 each Topical Q0600 Norine Manuelita LABOR, MD   6 each at 08/17/24 0511   Darbepoetin Alfa  (ARANESP ) injection 100 mcg  100 mcg Subcutaneous Q Fri-1800 Kruska, Lindsay A, MD   100 mcg at 08/15/24 1854   divalproex  (DEPAKOTE ) DR tablet 250 mg  250 mg Oral Q24H Jerri Pfeiffer, MD       divalproex  (DEPAKOTE ) DR tablet 500 mg  500 mg Oral BID Jerri Pfeiffer, MD        doxercalciferol  (HECTOROL ) injection 6 mcg  6 mcg Intravenous Q T,Th,Sa-HD Norine Manuelita LABOR, MD   6 mcg at 08/15/24 1232   feeding supplement (NEPRO CARB STEADY) liquid 237 mL  237 mL Oral BID BM Sethi, Pramod S, MD   237 mL at 08/18/24 1012   insulin  aspart (novoLOG ) injection 0-6 Units  0-6 Units Subcutaneous TID PC & HS Jerri Pfeiffer, MD       levothyroxine  (SYNTHROID ) tablet 75 mcg  75 mcg Oral QAC breakfast de Clint Kill, Cortney E, NP   75 mcg at 08/18/24 9160   senna-docusate (Senokot-S) tablet 1 tablet  1 tablet Oral QHS PRN de Clint Kill, Cortney E, NP       sevelamer  carbonate (RENVELA ) tablet 1,600 mg  1,600 mg Oral TID WC Sethi, Pramod S, MD   1,600 mg at 08/18/24 9160   sodium bicarbonate  tablet 650 mg  650 mg Oral BID de Clint Kill, Cortney E, NP   650 mg at 08/18/24 9073   sodium chloride  0.9 % bolus 500 mL  500 mL Intravenous Once Lindzen, Eric, MD   Held at 08/14/24 0243   sodium chloride  flush (NS) 0.9 % injection 3 mL  3 mL Intravenous Once Naasz, Hayley N, MD         Discharge Medications: Please see discharge summary for a list of discharge medications.  Relevant Imaging Results:  Relevant Lab Results:   Additional Information SS#: [id number]; HD at [facility] [place] on MWF 11:55 am chair time  Norma Coleman, [place]

## 2024-08-18 NOTE — TOC CAGE-AID Note (Signed)
 Transition of Care [facility]) - CAGE-AID Screening   Patient Details  Name: Norma Coleman MRN: [id number] Date of Birth: 05-21-1933  [facility] CM/SW Contact:    Matteus Mcnelly E Aphrodite Harpenau, LCSW Phone Number: 08/18/2024, 9:53 AM   Clinical Narrative:    CAGE-AID Screening:    Have You Ever Felt You Ought to Cut Down on Your Drinking or Drug Use?: No Have People Annoyed You By [employer] Your Drinking Or Drug Use?: No Have You Felt Bad Or Guilty About Your Drinking Or Drug Use?: No Have You Ever Had a Drink or Used Drugs First Thing In The Morning to Steady Your Nerves or to Get Rid of a Hangover?: No CAGE-AID Score: 0  Substance Abuse Education Offered: No

## 2024-08-18 NOTE — TOC Initial Note (Addendum)
 Transition of Care [facility]) - Initial/Assessment Note    Patient Details  Name: Norma Coleman MRN: [id number] Date of Birth: [birth year]  Transition of Care [facility]) CM/SW Contact:    Almarie CHRISTELLA Goodie, LCSW Phone Number: 08/18/2024, 11:21 AM  Clinical Narrative:    CSW met with patient to discuss disposition. Patient originally from home with daughter, usually independent for ADLs but uses RW for ambulation. CSW discussed therapy coming back to evaluate patient again, but discussed possible care needs with the patient. Per patient, the daughter is there 24/7 and could assist with ADLs if needed. Patient would also be in agreement with short term rehab if that is recommended, no preference for facility. Patient admitted to being confused and not sure what's going on, that she just wanted to get better. CSW coordinated with PT/OT to have someone work with patient for updated recommendations. CSW to follow.        UPDATE: CSW noting recommendations for SNF. CSW contacted patient's daughter, Burnard, per patient's request to discuss. Daughter in agreement, no preference for facility. CSW answered questions and discussed expectations, daughter appreciative. CSW faxed out referral, will update patient and daughter with bed offers.     Expected Discharge Plan:  (TBD) Barriers to Discharge: Continued Medical Work up, English As A Second Language Teacher   Patient Goals and CMS Choice Patient states their goals for this hospitalization and ongoing recovery are:: to get better          Expected Discharge Plan and Services       Living arrangements for the past 2 months: Single Family Home                                      Prior Living Arrangements/Services Living arrangements for the past 2 months: Single Family Home Lives with:: Adult Children Patient language and need for interpreter reviewed:: No Do you feel safe going back to the place where you live?: Yes      Need for Family  Participation in Patient Care: No (Comment) Care giver support system in place?: Yes (comment) Current home services: DME Criminal Activity/Legal Involvement Pertinent to Current Situation/Hospitalization: No - Comment as needed  Activities of Daily Living   ADL Screening (condition at time of admission) Independently performs ADLs?: No Does the patient have a NEW difficulty with bathing/dressing/toileting/self-feeding that is expected to last >3 days?: Yes (Initiates electronic notice to provider for possible OT consult) Does the patient have a NEW difficulty with getting in/out of bed, walking, or climbing stairs that is expected to last >3 days?: Yes (Initiates electronic notice to provider for possible PT consult) Does the patient have a NEW difficulty with communication that is expected to last >3 days?: Yes (Initiates electronic notice to provider for possible SLP consult) Is the patient deaf or have difficulty hearing?: No Does the patient have difficulty seeing, even when wearing glasses/contacts?: No Does the patient have difficulty concentrating, remembering, or making decisions?: No  Permission Sought/Granted Permission sought to share information with : Facility Medical Sales Representative, Family Supports Permission granted to share information with : Yes, Verbal Permission Granted  Share Information with NAME: Burnard  Permission granted to share info w AGENCY: SNF  Permission granted to share info w Relationship: Daughter     Emotional Assessment Appearance:: Appears stated age Attitude/Demeanor/Rapport: Engaged Affect (typically observed): Appropriate Orientation: : Oriented to Self, Oriented to Place, Oriented to  Time, Oriented to  Situation Alcohol / Substance Use: Not Applicable Psych Involvement: No (comment)  Admission diagnosis:  Stroke (cerebrum) (HCC) [I63.9] Acute cerebrovascular accident (CVA) due to occlusion of right middle cerebral artery (HCC)  [I63.511] Patient Active Problem List   Diagnosis Date Noted   Stroke (cerebrum) (HCC) 08/12/2024   Malnutrition of moderate degree 09/15/2022   ESRD (end stage renal disease) (HCC) 09/13/2022   Influenza A 09/12/2022   Acute renal failure superimposed on stage 5 chronic kidney disease, not on chronic dialysis (HCC) 08/07/2022   Constipation 08/07/2022   Paget's disease of bone 08/06/2022   Lower GI bleed 08/05/2022   CKD (chronic kidney disease) stage 5, GFR less than 15 ml/min (HCC) 08/05/2022   Essential hypertension 08/05/2022   Hyperlipidemia 08/05/2022   History of DVT (deep vein thrombosis) 08/05/2022   Hypothyroidism 08/05/2022   DM2 (diabetes mellitus, type 2) (HCC) 08/05/2022   Diarrhea 08/05/2022   Chronic diastolic CHF (congestive heart failure) (HCC) 08/05/2022   Cardiomegaly 08/05/2022   PCP:  Corlis Pagan, NP Pharmacy:   [facility] [phone] - [place], [place] - [address] [place] [address] Phone: [phone] Fax: [phone]     Social Drivers of Health (SDOH) Social History: SDOH Screenings   Food Insecurity: No Food Insecurity (09/12/2022)  Housing: Low Risk  (09/12/2022)  Transportation Needs: No Transportation Needs (09/12/2022)  Utilities: Not At Risk (09/12/2022)  Tobacco Use: Low Risk  (05/09/2024)   SDOH Interventions:     Readmission Risk Interventions     No data to display

## 2024-08-18 NOTE — Care Management Important Message (Signed)
 Important Message  Patient Details  Name: Norma Coleman MRN: [id number] Date of Birth: [birth year]   Important Message Given:  Yes - Medicare IM     Claretta Deed 08/18/2024, 4:27 PM

## 2024-08-18 NOTE — Progress Notes (Addendum)
 STROKE TEAM PROGRESS NOTE    SIGNIFICANT HOSPITAL EVENTS 12/2 presented with acute left side weakness. NO TNK due to outside window, CTA with R MCA occlusion, taken for mechanical thrombectomy  12/4: Repeat CT 12/3 raised concerns about cytotoxic edema in the right MCA territory suggesting right MCA reocclusion however MRI scan showed stable appearance of the right MCA infarct without significant cytotoxic edema and MRA brain showed patent right MCA  12/5: HD, transferred out of ICU  12/6: On AM labs, patients Hgb dropped to  6.7, no signs of bleeding.  Post-transfuion, Hgb 8.3  INTERIM HISTORY/SUBJECTIVE  PT/OT to re evaluate for SNF. Dialysis scheduled for today. She is awake, alert, interactive. Strength is good bilaterally, slight left sided weakness, states she hasn't been up out of bed. She was able to eat most of her breakfast this morning.    CBC    Component Value Date/Time   WBC 6.2 08/18/2024 0401   RBC 2.45 (L) 08/18/2024 0401   HGB 7.9 (L) 08/18/2024 0401   HCT 23.5 (L) 08/18/2024 0401   PLT 151 08/18/2024 0401   MCV 95.9 08/18/2024 0401   MCH 32.2 08/18/2024 0401   MCHC 33.6 08/18/2024 0401   RDW 14.6 08/18/2024 0401   LYMPHSABS 0.8 08/12/2024 1233   MONOABS 0.5 08/12/2024 1233   EOSABS 0.0 08/12/2024 1233   BASOSABS 0.0 08/12/2024 1233    BMET    Component Value Date/Time   NA 134 (L) 08/18/2024 0401   K 3.8 08/18/2024 0401   CL 92 (L) 08/18/2024 0401   CO2 26 08/18/2024 0401   GLUCOSE 88 08/18/2024 0401   BUN 57 (H) 08/18/2024 0401   CREATININE 6.89 (H) 08/18/2024 0401   CALCIUM  8.3 (L) 08/18/2024 0401   GFRNONAA 5 (L) 08/18/2024 0401    IMAGING past 24 hours No results found.   Vitals:   08/17/24 0813 08/17/24 1224 08/18/24 0041 08/18/24 0921  BP: (!) 132/53 135/63 99/73 (!) 159/77  Pulse: 84 87 81 86  Resp: 16 18 16 18   Temp: 97.8 F (36.6 C) 98.4 F (36.9 C) 98.4 F (36.9 C) 98.4 F (36.9 C)  TempSrc: Oral Oral Oral Oral  SpO2: 95%  96% 98% 94%  Weight:      Height:        PHYSICAL EXAM General: critically ill  Psych:  Mood and affect appropriate for situation CV: Regular rate and rhythm on monitor Respiratory:  Regular, unlabored respirations on nasal cannula  GI: Abdomen soft and nontender  NEURO:  Mental Status: She is awake and alert. Oriented to self, age, place, some of situation. No aphasia, slight dysarthria--improved.   Cranial Nerves:  II: PERRL. Visual fields with left hemianopia III, IV, VI: EOMI. Eyelids elevate symmetrically.  V: Sensation is intact to light touch and symmetrical to face.  VII: left facial droop, improved VIII: hearing intact to voice. IX, X: Palate elevates symmetrically. Phonation is normal.  KP:Dynloizm shrug 5/5. XII: tongue is midline without fasciculations. Motor: right side is purposeful  LUE: 4/5, no drift LLE: 4-/5, no drift Tone: is normal and bulk is normal Sensation- responds to noxious stimuli    Coordination: unable to assess Gait- deferred  Most Recent NIH: 6  ASSESSMENT/PLAN  Norma Coleman is a 89 y.o. female with history of  hx of end-stage renal disease on dialysis, hypertension, hyperlipidemia and hypothyroidism who presents with acute onset left-sided weakness, left hemianopsia and dysarthria.   NIH on Admission 7  Stroke:  right  MCA small infarcts with R M2 occlusion s/p IR with TICI 3, etiology:  likely cardioembolic   Code Stroke  CT head Acute right MCA infarct involving the frontoparietal operculum. Small vessel disease.  CTA head & neck Occlusion of the right M2 inferior division at its origin.  CT perfusion 0/43 with pseudonormalization so the core was underestimated  MRI: Small acute right MCA infarct. Severe chronic small vessel ischemic disease. Repeat CT head: Mild hyperdensity over the right MCA territory, likely contrast staining following neurointerventional procedure.   Repeat MRI scan brain shows stable ischemic appearance of  the right MCA infarct without significant cytotoxic edema 2D Echo  EF 60-65%  Recommend 30-day heart monitor after discharge-cardiology ordered 12/4 - follow up appt scheduled for 10/09/2024 LDL 35 HgbA1c 5.5 VTE prophylaxis - heparin  SQ No antithrombotic prior to admission, continue asa 81mg . Continue to hold DAPT for now due to severe anemia.  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  Hospital day # 6  Patient seen and examined by NP/APP with MD. MD to update note as needed.   Jorene Last, DNP, FNP-BC [facility] Pager: [phone]  ATTENDING NOTE: I reviewed above note and agree with the assessment and plan. Pt was seen and examined.   No family at bedside.  Patient lying bed, pending transfer to dialysis.  No acute event overnight, neuro stable.  W level still on the high end, will decrease Depakote  to 500/250/500.  Continue monitor Depakote  level.  On aspirin  and statin.  Hemoglobin 7.9, continue hold Plavix .  PT and OT recommend SNF.  For detailed assessment and plan, please refer to above as I have made changes wherever appropriate.   Ary Cummins, MD PhD Stroke  Neurology 08/18/2024 6:37 PM

## 2024-08-18 NOTE — Progress Notes (Addendum)
 Occupational Therapy Treatment Patient Details Name: Norma Coleman MRN: [id number] DOB: 27-Oct-1932 Today's Date: 08/18/2024   History of present illness 89 yo female presents with L side weakness, L hemianopsia and dysarthria after falling out of bed and striking her head. NIH 10 CT (+) R MCA occlusion, R cerebellopontine mass compatible with meningioma, R MCA infarct involving frontoparietal operculum.  12/2 R M1 thrombectomy TICI 3. Complicated by new onset seizure activity, activity localizes to the Left hemisphere. MRI 12/3 also showed L parietal lobe and R frontal acute infarct PMH includes: ESRD on HD, HTN, DM2, CHF, DVT.   OT comments  Pt with excellent progression this session and POC updated accordingly due to max improvement in arousal and functional participation. Pt asking for assist calling in lunch and calling daughter this session. Pt noted with decreased delayed memory recall, safety, and understanding of renal diet with attempts to call in lunch order. Pt progressing OOB this session with min A +2 present for safety and able to sequence basic functional tasks such as peicare. For now, patient will benefit from continued inpatient follow up therapy, <3 hours/day.       If plan is discharge home, recommend the following:  A little help with walking and/or transfers;A little help with bathing/dressing/bathroom;Assistance with cooking/housework;Direct supervision/assist for medications management;Direct supervision/assist for financial management;Assist for transportation   Equipment Recommendations  None recommended by OT    Recommendations for Other Services      Precautions / Restrictions Precautions Precautions: Fall Recall of Precautions/Restrictions: Intact Restrictions Weight Bearing Restrictions Per Provider Order: No       Mobility Bed Mobility Overal bed mobility: Needs Assistance Bed Mobility: Supine to Sit, Sit to Supine     Supine to sit: Contact  guard, HOB elevated Sit to supine: Contact guard assist   General bed mobility comments: incr time and assist with linens    Transfers Overall transfer level: Needs assistance Equipment used: Rolling walker (2 wheels) Transfers: Sit to/from Stand, Bed to chair/wheelchair/BSC Sit to Stand: Min assist     Step pivot transfers: Min assist, +2 safety/equipment     General transfer comment: from EOB to [facility] with RW with cues for sequencing/hand placement (again required BSC to bed and stand from EOB prior to ambulation).     Balance Overall balance assessment: Needs assistance Sitting-balance support: No upper extremity supported, Feet supported Sitting balance-Leahy Scale: Fair     Standing balance support: Bilateral upper extremity supported, During functional activity Standing balance-Leahy Scale: Poor                             ADL either performed or assessed with clinical judgement   ADL Overall ADL's : Needs assistance/impaired                         Toilet Transfer: Minimal assistance;+2 for safety/equipment;Stand-pivot;Rolling walker (2 wheels);BSC/3in1   Toileting- Clothing Manipulation and Hygiene: Minimal assistance;Sit to/from stand Toileting - Clothing Manipulation Details (indicate cue type and reason): for balance during pericare     Functional mobility during ADLs: Minimal assistance;Rolling walker (2 wheels)      Extremity/Trunk Assessment Upper Extremity Assessment Upper Extremity Assessment: Generalized weakness   Lower Extremity Assessment Lower Extremity Assessment: Defer to PT evaluation        Vision   Vision Assessment?: Vision impaired- to be further tested in functional context Additional Comments: pt with fairly accurate report  during visual fields assessment, but intermittently with compensatory head turn. appears [facility] today, but per chart, has had had some hemianopia, will need to be further assessed   Perception      Praxis     Communication Communication Communication: Impaired Factors Affecting Communication: Hearing impaired   Cognition Arousal: Alert Behavior During Therapy: WFL for tasks assessed/performed Cognition: Cognition impaired     Awareness: Online awareness impaired Memory impairment (select all impairments): Short-term memory (pt also hard of hearing making cognitive assessment more difficult) Attention impairment (select first level of impairment): Selective attention Executive functioning impairment (select all impairments): Organization, Problem solving OT - Cognition Comments: follows one step commands with increased time, aware she had a CVA, noting difficulty fully understanding renal diet when trying to order lunch as well as poor delayed memory recall                 Following commands: Impaired Following commands impaired: Follows one step commands with increased time      Cueing   Cueing Techniques: Verbal cues, Tactile cues  Exercises      Shoulder Instructions       General Comments      Pertinent Vitals/ Pain       Pain Assessment Pain Assessment: Faces Faces Pain Scale: Hurts a little bit Pain Location: rt hip Pain Descriptors / Indicators: Aching, Other (Comment) (popping) Pain Intervention(s): Limited activity within patient's tolerance, Monitored during session  Home Living Family/patient expects to be discharged to:: Private residence Living Arrangements: Children (daughter and son in social worker) Available Help at Discharge: Family Type of Home: House Home Access: Stairs to enter Secretary/administrator of Steps: 5 Entrance Stairs-Rails: Right;Left;Can reach both Home Layout: Able to live on main level with bedroom/bathroom     Bathroom Shower/Tub: Producer, Television/film/video: Standard     Home Equipment: Rollator (4 wheels);Cane - single point;BSC/3in1;Shower seat          Prior Functioning/Environment               Frequency  Min 2X/week        Progress Toward Goals  OT Goals(current goals can now be found in the care plan section)     Acute Rehab OT Goals Patient Stated Goal: get better OT Goal Formulation: With patient Time For Goal Achievement: 08/28/24 Potential to Achieve Goals: Good  Plan      Co-evaluation      Reason for Co-Treatment: Complexity of the patient's impairments (multi-system involvement);To address functional/ADL transfers PT goals addressed during session: Balance;Strengthening/ROM        AM-PAC OT 6 Clicks Daily Activity     Outcome Measure   Help from another person eating meals?: A Little Help from another person taking care of personal grooming?: A Little Help from another person toileting, which includes using toliet, bedpan, or urinal?: A Little Help from another person bathing (including washing, rinsing, drying)?: A Little Help from another person to put on and taking off regular upper body clothing?: A Little Help from another person to put on and taking off regular lower body clothing?: A Little 6 Click Score: 18    End of Session Equipment Utilized During Treatment: Gait belt;Rolling walker (2 wheels)  OT Visit Diagnosis: Other abnormalities of gait and mobility (R26.89);Hemiplegia and hemiparesis;Cognitive communication deficit (R41.841);Other symptoms and signs involving cognitive function;Other symptoms and signs involving the nervous system (R29.898);Muscle weakness (generalized) (M62.81) Symptoms and signs involving cognitive functions: Cerebral infarction Hemiplegia - Right/Left:  Left Hemiplegia - dominant/non-dominant: Non-Dominant Hemiplegia - caused by: Cerebral infarction   Activity Tolerance Patient tolerated treatment well   Patient Left in bed;with call bell/phone within reach;with bed alarm set   Nurse Communication Mobility status;Precautions        Time: 8951-8881 OT Time Calculation (min): 30 min  Charges: OT  General Charges $OT Visit: 1 Visit OT Treatments $Self Care/Home Management : 8-22 mins  Elma JONETTA Lebron FREDERICK, OTR/L [facility] Acute Rehabilitation Office: [phone]   Elma JONETTA Lebron 08/18/2024, 1:13 PM

## 2024-08-18 NOTE — Progress Notes (Signed)
 1.5 liters ultrafiltration, post hemodialysis condition stable and report was given to the RN assuming care.

## 2024-08-18 NOTE — Progress Notes (Signed)
 [facility] Progress Note   Subjective:  Seen in room, no new issues.Eating breakfast, but not much of appetite.   Objective Vitals:   08/17/24 0813 08/17/24 1224 08/18/24 0041 08/18/24 0921  BP: (!) 132/53 135/63 99/73 (!) 159/77  Pulse: 84 87 81 86  Resp: 16 18 16 18   Temp: 97.8 F (36.6 C) 98.4 F (36.9 C) 98.4 F (36.9 C) 98.4 F (36.9 C)  TempSrc: Oral Oral Oral Oral  SpO2: 95% 96% 98% 94%  Weight:      Height:       Physical Exam General: awake alert and interactive Heart: RRR Lungs: clear on RA Abdomen:soft Extremities: no edema Neuro:  oriented to self, hosp, 2025 but hazy on details, very mild dysarthria noted Dialysis Access: LUE AVF +t/b  Additional Objective Labs: Basic Metabolic Panel: Recent Labs  Lab 08/14/24 0401 08/15/24 0338 08/16/24 0152 08/17/24 0615 08/18/24 0401  NA 132*   < > 134* 135 134*  K 4.3   < > 3.9 3.7 3.8  CL 95*   < > 94* 96* 92*  CO2 24   < > 24 28 26   GLUCOSE 77   < > 76 82 88  BUN 31*   < > 27* 45* 57*  CREATININE 5.66*   < > 4.11* 5.76* 6.89*  CALCIUM  8.3*   < > 8.8* 8.3* 8.3*  PHOS 5.7*  --   --   --   --    < > = values in this interval not displayed.   Liver Function Tests: Recent Labs  Lab 08/12/24 1233 08/13/24 0247 08/18/24 0401  AST 17 22 16   ALT 9 8 8   ALKPHOS 332* 275* 212*  BILITOT 0.8 0.7 0.9  PROT 6.6 5.4* 4.9*  ALBUMIN  3.5 2.9* 2.6*   No results for input(s): LIPASE, AMYLASE in the last 168 hours. CBC: Recent Labs  Lab 08/12/24 1233 08/12/24 1235 08/14/24 0401 08/15/24 0338 08/16/24 0152 08/16/24 1927 08/17/24 0615 08/18/24 0401  WBC 9.8   < > 9.9 5.5 4.2  --  4.2 6.2  NEUTROABS 8.4*  --   --   --   --   --   --   --   HGB 10.5*   < > 7.7* 7.0* 6.7* 8.3* 7.9* 7.9*  HCT 34.3*   < > 23.8* 21.3* 21.2* 24.6* 23.8* 23.5*  MCV 103.3*   < > 96.7 97.3 97.7  --  96.0 95.9  PLT 213   < > 177 165 149*  --  137* 151   < > = values in this interval not displayed.   Blood  Culture    Component Value Date/Time   SDES  08/05/2022 2230    BLOOD SITE NOT SPECIFIED Performed at [facility], [address] [place], [place] [address]    SPECREQUEST  08/05/2022 2230    BOTTLES DRAWN AEROBIC AND ANAEROBIC Blood Culture adequate volume Performed at [facility], [address], [place], [place] [address]    CULT  08/05/2022 2230    NO GROWTH 5 DAYS Performed at [facility], [address] [place], [place] [address]    REPTSTATUS 08/11/2022 FINAL 08/05/2022 2230    Cardiac Enzymes: No results for input(s): CKTOTAL, CKMB, CKMBINDEX, TROPONINI in the last 168 hours. CBG: Recent Labs  Lab 08/17/24 1612 08/17/24 2013 08/17/24 2327 08/18/24 0355 08/18/24 0838  GLUCAP 96 113* 103* 108* 93   Iron Studies:  No results for  input(s): IRON, TIBC, TRANSFERRIN, FERRITIN in the last 72 hours.  @lablastinr3 @ Studies/Results: No results found.  Medications:  sodium chloride  Stopped (08/14/24 0243)    aspirin  EC  81 mg Oral Daily   Or   aspirin   300 mg Rectal Daily   atorvastatin   40 mg Oral Daily   Chlorhexidine  Gluconate Cloth  6 each Topical Q0600   Chlorhexidine  Gluconate Cloth  6 each Topical Q0600   darbepoetin (ARANESP ) injection - DIALYSIS  100 mcg Subcutaneous Q Fri-1800   divalproex   500 mg Oral Q8H   doxercalciferol   6 mcg Intravenous Q T,Th,Sa-HD   feeding supplement (NEPRO CARB STEADY)  237 mL Oral BID BM   insulin  aspart  0-6 Units Subcutaneous Q4H   levothyroxine   75 mcg Oral QAC breakfast   sevelamer  carbonate  1,600 mg Oral TID WC   sodium bicarbonate   650 mg Oral BID   sodium chloride  flush  3 mL Intravenous Once   Dialysis Orders:  AF MWF 3:15 350/600 EDW 57.5 kg 2K/2Ca AVF 16g Heparin  3000 +2000 midrun No ESA orders. 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 C/b new seizures.  Management per  neuro.  Doing well.  2.  ESRD.  HD MW outpt per [facility] records though daughter told nurse yesterday she's TTS. HD today per MWF schedule.  3.  Volume/BP. BP 130s. No gross volume on exam. UF with HD. Standing post weight.  4.  Anemia.Hb down trended to 6s, iron replete.  Transfused 12/6 per primary 1 u with post Hb 8.3, 7.9 today. Has hematoma at femoral access site but looks stable and no abd pain to suggest RP bleed.  Resumed ESA, dosed 12/5.    5.  Metabolic bone disease.  Follow Ca/Phos trend. On hectorol  and renvela  per home doses; 12/4 phos 5.7.   Dispo pending.     Maisie Ronnald Acosta PA-C [facility] 08/18/2024,10:03 AM

## 2024-08-18 NOTE — Progress Notes (Signed)
 Physical Therapy Treatment Patient Details Name: Norma Coleman MRN: [id number] DOB: 1933/02/22 Today's Date: 08/18/2024   History of Present Illness 89 yo female presents with L side weakness, L hemianopsia and dysarthria after falling out of bed and striking her head. NIH 10 CT (+) R MCA occlusion, R cerebellopontine mass compatible with meningioma, R MCA infarct involving frontoparietal operculum.  12/2 R M1 thrombectomy TICI 3. Complicated by new onset seizure activity, activity localizes to the Left hemisphere. MRI 12/3 also showed L parietal lobe and R frontal acute infarct PMH includes: ESRD on HD, HTN, DM2, CHF, DVT.    PT Comments  Patient alert and trying to figure out how to use the phone to call her daughter. Asking for daughter's phone number and then help to order her lunch. Patient progressed OOB to [facility] with min assist with RW (+2 for safety). Progressed to ambulation with RW and min assist x 40 ft. Patient has made excellent progress since evaluation with updated recommendation for post-acute inpatient therapies <3 hrs/day to then facilitate return home with daughter's assistance. Goals updated to reflect patient's improvement.    If plan is discharge home, recommend the following: A little help with walking and/or transfers;A little help with bathing/dressing/bathroom;Assistance with cooking/housework;Direct supervision/assist for medications management;Direct supervision/assist for financial management;Assist for transportation;Help with stairs or ramp for entrance;Supervision due to cognitive status   Can travel by private vehicle     Yes  Equipment Recommendations  Hospital bed;Wheelchair (measurements PT);Wheelchair cushion (measurements PT)    Recommendations for Other Services       Precautions / Restrictions Precautions Precautions: Fall Recall of Precautions/Restrictions: Intact Restrictions Weight Bearing Restrictions Per Provider Order: No     Mobility   Bed Mobility Overal bed mobility: Needs Assistance Bed Mobility: Supine to Sit, Sit to Supine     Supine to sit: Contact guard, HOB elevated Sit to supine: Contact guard assist   General bed mobility comments: incr time and assist with linens    Transfers Overall transfer level: Needs assistance Equipment used: Rolling walker (2 wheels) Transfers: Sit to/from Stand, Bed to chair/wheelchair/BSC Sit to Stand: Min assist   Step pivot transfers: Min assist, +2 safety/equipment       General transfer comment: from EOB to [facility] with RW with cues for sequencing/hand placement (again required BSC to bed and stand from EOB prior to ambulation).    Ambulation/Gait Ambulation/Gait assistance: Min assist Gait Distance (Feet): 40 Feet Assistive device: Rolling walker (2 wheels) Gait Pattern/deviations: Step-through pattern, Decreased stride length, Trunk flexed   Gait velocity interpretation: <1.8 ft/sec, indicate of risk for recurrent falls   General Gait Details: pushing RW too far ahead (can correct with cues but does not maintain); some assist with turning RW (pt normally uses rollator)   Optometrist     Tilt Bed    Modified Rankin (Stroke Patients Only) Modified Rankin (Stroke Patients Only) Pre-Morbid Rankin Score: Moderate disability Modified Rankin: Moderately severe disability     Balance Overall balance assessment: Needs assistance Sitting-balance support: No upper extremity supported, Feet supported Sitting balance-Leahy Scale: Fair     Standing balance support: Bilateral upper extremity supported, During functional activity Standing balance-Leahy Scale: Poor                              Communication Communication Communication: Impaired Factors Affecting Communication: Hearing impaired  Cognition Arousal: Alert Behavior During Therapy: WFL for tasks assessed/performed   PT - Cognitive impairments: No  family/caregiver present to determine baseline                       PT - Cognition Comments: Oriented to self, location, situation (date/time NT); following commands with some delay (also HOH) Following commands: Impaired Following commands impaired: Follows one step commands with increased time    Cueing Cueing Techniques: Verbal cues, Tactile cues  Exercises      General Comments        Pertinent Vitals/Pain Pain Assessment Pain Assessment: Faces Faces Pain Scale: Hurts a little bit Pain Location: rt hip Pain Descriptors / Indicators: Aching, Other (Comment) (popping) Pain Intervention(s): Limited activity within patient's tolerance, Monitored during session    Home Living Family/patient expects to be discharged to:: Private residence Living Arrangements: Children (daughter and son in social worker) Available Help at Discharge: Family Type of Home: House Home Access: Stairs to enter Entrance Stairs-Rails: Right;Left;Can reach both Entrance Stairs-Number of Steps: 5   Home Layout: Able to live on main level with bedroom/bathroom Home Equipment: Rollator (4 wheels);Cane - single point;BSC/3in1;Shower seat      Prior Function            PT Goals (current goals can now be found in the care plan section) Acute Rehab PT Goals Patient Stated Goal: to go home with daughter PT Goal Formulation: With patient Time For Goal Achievement: 09/01/24 Potential to Achieve Goals: Good Progress towards PT goals: Goals met and updated - see care plan    Frequency    Min 2X/week      PT Plan      Co-evaluation PT/OT/SLP Co-Evaluation/Treatment: Yes Reason for Co-Treatment: Complexity of the patient's impairments (multi-system involvement);To address functional/ADL transfers PT goals addressed during session: Balance;Strengthening/ROM        AM-PAC PT 6 Clicks Mobility   Outcome Measure  Help needed turning from your back to your side while in a flat bed without  using bedrails?: A Little Help needed moving from lying on your back to sitting on the side of a flat bed without using bedrails?: A Little Help needed moving to and from a bed to a chair (including a wheelchair)?: A Little Help needed standing up from a chair using your arms (e.g., wheelchair or bedside chair)?: A Little Help needed to walk in hospital room?: A Little Help needed climbing 3-5 steps with a railing? : Total 6 Click Score: 16    End of Session Equipment Utilized During Treatment: Gait belt Activity Tolerance: Patient tolerated treatment well Patient left: in bed;with call bell/phone within reach;with bed alarm set   PT Visit Diagnosis: Muscle weakness (generalized) (M62.81);History of falling (Z91.81);Other symptoms and signs involving the nervous system (R29.898)     Time: 8952-8882 PT Time Calculation (min) (ACUTE ONLY): 30 min  Charges:    $Gait Training: 8-22 mins PT General Charges $$ ACUTE PT VISIT: 1 Visit                      Macario RAMAN, PT Acute Rehabilitation Services  Office [phone]    Macario SHAUNNA Soja 08/18/2024, 12:15 PM

## 2024-08-19 LAB — HEPATIC FUNCTION PANEL
ALT: 9 U/L (ref 0–44)
AST: 13 U/L — ABNORMAL LOW (ref 15–41)
Albumin: 2.6 g/dL — ABNORMAL LOW (ref 3.5–5.0)
Alkaline Phosphatase: 216 U/L — ABNORMAL HIGH (ref 38–126)
Bilirubin, Direct: <0.1 mg/dL (ref 0.0–0.2)
Total Bilirubin: 0.9 mg/dL (ref 0.0–1.2)
Total Protein: 5.2 g/dL — ABNORMAL LOW (ref 6.5–8.1)

## 2024-08-19 LAB — BASIC METABOLIC PANEL WITH GFR
Anion gap: 12 (ref 5–15)
BUN: 21 mg/dL (ref 8–23)
CO2: 29 mmol/L (ref 22–32)
Calcium: 8.8 mg/dL — ABNORMAL LOW (ref 8.9–10.3)
Chloride: 95 mmol/L — ABNORMAL LOW (ref 98–111)
Creatinine, Ser: 4.02 mg/dL — ABNORMAL HIGH (ref 0.44–1.00)
GFR, Estimated: 10 mL/min — ABNORMAL LOW (ref >60)
Glucose, Bld: 86 mg/dL (ref 70–99)
Potassium: 3.6 mmol/L (ref 3.5–5.1)
Sodium: 136 mmol/L (ref 135–145)

## 2024-08-19 LAB — CBC
HCT: 26.1 % — ABNORMAL LOW (ref 36.0–46.0)
Hemoglobin: 8.6 g/dL — ABNORMAL LOW (ref 12.0–15.0)
MCH: 32.1 pg (ref 26.0–34.0)
MCHC: 33 g/dL (ref 30.0–36.0)
MCV: 97.4 fL (ref 80.0–100.0)
Platelets: 149 K/uL — ABNORMAL LOW (ref 150–400)
RBC: 2.68 MIL/uL — ABNORMAL LOW (ref 3.87–5.11)
RDW: 14.5 % (ref 11.5–15.5)
WBC: 5.5 K/uL (ref 4.0–10.5)
nRBC: 0 % (ref 0.0–0.2)

## 2024-08-19 LAB — GLUCOSE, CAPILLARY: Glucose-Capillary: 92 mg/dL (ref 70–99)

## 2024-08-19 LAB — VALPROIC ACID LEVEL: Valproic Acid Lvl: 78 ug/mL (ref 50–100)

## 2024-08-19 MED ORDER — BISACODYL 10 MG RE SUPP
10.0000 mg | Freq: Every day | RECTAL | Status: DC | PRN
  Administered 2024-08-19: 10 mg via RECTAL
  Filled 2024-08-19: qty 1

## 2024-08-19 MED ORDER — POLYETHYLENE GLYCOL 3350 17 G PO PACK
17.0000 g | PACK | Freq: Every day | ORAL | Status: DC | PRN
  Administered 2024-08-19: 17 g via ORAL
  Filled 2024-08-19: qty 1

## 2024-08-19 NOTE — Progress Notes (Signed)
 [facility] Progress Note   Subjective:   Completed dialysis yesterday. No issues reported. Didn't rest well, otherwise no complaints.   Objective Vitals:   08/18/24 1557 08/18/24 1655 08/18/24 2319 08/19/24 0748  BP: 130/73 (!) 150/83 (!) 116/54 (!) 140/50  Pulse: 88 94 96 82  Resp: 16 18 16 16   Temp: 97.6 F (36.4 C) 98.7 F (37.1 C) 97.7 F (36.5 C) 98.1 F (36.7 C)  TempSrc:  Oral Oral Oral  SpO2: 100% 97% 96% 98%  Weight:      Height:       Physical Exam General: awake alert and interactive Heart: RRR Lungs: clear on RA Abdomen:soft Extremities: no edema Neuro:  oriented to self, hosp, 2025 but hazy on details, very mild dysarthria noted Dialysis Access: LUE AVF +t/b  Additional Objective Labs: Basic Metabolic Panel: Recent Labs  Lab 08/14/24 0401 08/15/24 0338 08/17/24 0615 08/18/24 0401 08/19/24 0151  NA 132*   < > 135 134* 136  K 4.3   < > 3.7 3.8 3.6  CL 95*   < > 96* 92* 95*  CO2 24   < > 28 26 29   GLUCOSE 77   < > 82 88 86  BUN 31*   < > 45* 57* 21  CREATININE 5.66*   < > 5.76* 6.89* 4.02*  CALCIUM  8.3*   < > 8.3* 8.3* 8.8*  PHOS 5.7*  --   --   --   --    < > = values in this interval not displayed.   Liver Function Tests: Recent Labs  Lab 08/13/24 0247 08/18/24 0401 08/19/24 0151  AST 22 16 13*  ALT 8 8 9   ALKPHOS 275* 212* 216*  BILITOT 0.7 0.9 0.9  PROT 5.4* 4.9* 5.2*  ALBUMIN  2.9* 2.6* 2.6*   No results for input(s): LIPASE, AMYLASE in the last 168 hours. CBC: Recent Labs  Lab 08/12/24 1233 08/12/24 1235 08/15/24 0338 08/16/24 0152 08/16/24 1927 08/17/24 0615 08/18/24 0401 08/19/24 0151  WBC 9.8   < > 5.5 4.2  --  4.2 6.2 5.5  NEUTROABS 8.4*  --   --   --   --   --   --   --   HGB 10.5*   < > 7.0* 6.7*   < > 7.9* 7.9* 8.6*  HCT 34.3*   < > 21.3* 21.2*   < > 23.8* 23.5* 26.1*  MCV 103.3*   < > 97.3 97.7  --  96.0 95.9 97.4  PLT 213   < > 165 149*  --  137* 151 149*   < > = values in this interval  not displayed.   Blood Culture    Component Value Date/Time   SDES  08/05/2022 2230    BLOOD SITE NOT SPECIFIED Performed at [facility], [address] [place], [place] [address]    SPECREQUEST  08/05/2022 2230    BOTTLES DRAWN AEROBIC AND ANAEROBIC Blood Culture adequate volume Performed at [facility], [address], [place], [place] [address]    CULT  08/05/2022 2230    NO GROWTH 5 DAYS Performed at [facility], [address] [place], [place] [address]    REPTSTATUS 08/11/2022 FINAL 08/05/2022 2230    Cardiac Enzymes: No results for input(s): CKTOTAL, CKMB, CKMBINDEX, TROPONINI in the last 168 hours. CBG: Recent Labs  Lab 08/18/24 0355 08/18/24 0838 08/18/24 1720 08/18/24 2046 08/19/24 0616  GLUCAP 108* 93 179* 103* 92  Iron Studies:  No results for input(s): IRON, TIBC, TRANSFERRIN, FERRITIN in the last 72 hours.  @lablastinr3 @ Studies/Results: No results found.  Medications:  sodium chloride  Stopped (08/14/24 0243)    aspirin  EC  81 mg Oral Daily   Or   aspirin   300 mg Rectal Daily   atorvastatin   40 mg Oral Daily   Chlorhexidine  Gluconate Cloth  6 each Topical Q0600   Chlorhexidine  Gluconate Cloth  6 each Topical Q0600   darbepoetin (ARANESP ) injection - DIALYSIS  100 mcg Subcutaneous Q Fri-1800   divalproex   250 mg Oral Q24H   divalproex   500 mg Oral BID   doxercalciferol   6 mcg Intravenous Q T,Th,Sa-HD   feeding supplement (NEPRO CARB STEADY)  237 mL Oral BID BM   heparin  injection (subcutaneous)  5,000 Units Subcutaneous Q8H   levothyroxine   75 mcg Oral QAC breakfast   sevelamer  carbonate  1,600 mg Oral TID WC   sodium bicarbonate   650 mg Oral BID   sodium chloride  flush  3 mL Intravenous Once   Dialysis Orders:  [facility] MWF 3:15 350/600 EDW 57.5 kg 2K/2Ca AVF 16g Heparin  3000 +2000 midrun No ESA orders. Venofer 50 q 2k Hectorol  6 q HD, sevelamer  2 q ac    Assessment/Plan: Acute  CVA. R MCA infarct s/p IR thrombectomy. C/b new seizures.  Management per neuro.  Doing well.  2.  ESRD.  HD MW outpt per [facility] records though daughter told nurse yesterday she's TTS. HD today per MWF schedule.  3.  Volume/BP. BP stable.  No gross volume on exam. UF with HD. Standing post weight.  4.  Anemia.Hb down trended to 6s, iron replete.  Transfused 12/6 per primary 1 u with post Hb 8.3 >7.9>8.6 . Has hematoma at femoral access site but looks stable and no abd pain to suggest RP bleed.  Resumed ESA, dosed 12/5.    5.  Metabolic bone disease.  Follow Ca/Phos trend. On hectorol  and renvela  per home doses; 12/4 phos 5.7.   Dispo pending. Likely for SNF placement     Ora Bollig Ronnald Acosta PA-C [facility] 08/19/2024,11:32 AM

## 2024-08-19 NOTE — Progress Notes (Addendum)
 STROKE TEAM PROGRESS NOTE    SIGNIFICANT HOSPITAL EVENTS 12/2 presented with acute left side weakness. NO TNK due to outside window, CTA with R MCA occlusion, taken for mechanical thrombectomy  12/4: Repeat CT 12/3 raised concerns about cytotoxic edema in the right MCA territory suggesting right MCA reocclusion however MRI scan showed stable appearance of the right MCA infarct without significant cytotoxic edema and MRA brain showed patent right MCA  12/5: HD, transferred out of ICU  12/6: On AM labs, patients Hgb dropped to  6.7, no signs of bleeding.  Post-transfuion, Hgb 8.3  INTERIM HISTORY/SUBJECTIVE  She is awake, alert, interactive. Strength is good bilaterally, slight left sided weakness, did well working with PT/OT yesterday and tolerated dialysis well. She was able to eat most of her breakfast this morning. Daughter in contact with SW about SNF insurance auth requested for [facility] today.    CBC    Component Value Date/Time   WBC 5.5 08/19/2024 0151   RBC 2.68 (L) 08/19/2024 0151   HGB 8.6 (L) 08/19/2024 0151   HCT 26.1 (L) 08/19/2024 0151   PLT 149 (L) 08/19/2024 0151   MCV 97.4 08/19/2024 0151   MCH 32.1 08/19/2024 0151   MCHC 33.0 08/19/2024 0151   RDW 14.5 08/19/2024 0151   LYMPHSABS 0.8 08/12/2024 1233   MONOABS 0.5 08/12/2024 1233   EOSABS 0.0 08/12/2024 1233   BASOSABS 0.0 08/12/2024 1233    BMET    Component Value Date/Time   NA 136 08/19/2024 0151   K 3.6 08/19/2024 0151   CL 95 (L) 08/19/2024 0151   CO2 29 08/19/2024 0151   GLUCOSE 86 08/19/2024 0151   BUN 21 08/19/2024 0151   CREATININE 4.02 (H) 08/19/2024 0151   CALCIUM  8.8 (L) 08/19/2024 0151   GFRNONAA 10 (L) 08/19/2024 0151    IMAGING past 24 hours No results found.   Vitals:   08/18/24 1557 08/18/24 1655 08/18/24 2319 08/19/24 0748  BP: 130/73 (!) 150/83 (!) 116/54 (!) 140/50  Pulse: 88 94 96 82  Resp: 16 18 16 16   Temp: 97.6 F (36.4 C) 98.7 F (37.1 C) 97.7 F (36.5 C) 98.1 F  (36.7 C)  TempSrc:  Oral Oral Oral  SpO2: 100% 97% 96% 98%  Weight:      Height:        PHYSICAL EXAM General: critically ill  Psych:  Mood and affect appropriate for situation CV: Regular rate and rhythm on monitor Respiratory:  Regular, unlabored respirations on nasal cannula  GI: Abdomen soft and nontender  NEURO:  Mental Status: She is awake and alert. Oriented to self, age, place, some of situation. No aphasia, slight dysarthria--improved.   Cranial Nerves:  II: PERRL. Visual fields with left hemianopia III, IV, VI: EOMI. Eyelids elevate symmetrically.  V: Sensation is intact to light touch and symmetrical to face.  VII: left facial droop, improved VIII: hearing intact to voice. IX, X: Palate elevates symmetrically. Phonation is normal.  KP:Dynloizm shrug 5/5. XII: tongue is midline without fasciculations. Motor: right side is purposeful  LUE: 4/5, no drift LLE: 4-/5, no drift Tone: is normal and bulk is normal Sensation- responds to noxious stimuli    Coordination: unable to assess Gait- deferred  Most Recent NIH: 6  ASSESSMENT/PLAN  Ms. CHENILLE TOOR is a 89 y.o. female with history of  hx of end-stage renal disease on dialysis, hypertension, hyperlipidemia and hypothyroidism who presents with acute onset left-sided weakness, left hemianopsia and dysarthria.   NIH on  Admission 7  Stroke:  right MCA small infarcts with R M2 occlusion s/p IR with TICI 3, etiology:  likely cardioembolic   Code Stroke  CT head Acute right MCA infarct involving the frontoparietal operculum. Small vessel disease.  CTA head & neck Occlusion of the right M2 inferior division at its origin.  CT perfusion 0/43 with pseudonormalization so the core was underestimated  MRI: Small acute right MCA infarct. Severe chronic small vessel ischemic disease. Repeat CT head: Mild hyperdensity over the right MCA territory, likely contrast staining following neurointerventional procedure.   Repeat  MRI scan brain shows stable ischemic appearance of the right MCA infarct without significant cytotoxic edema 2D Echo  EF 60-65%  Recommend 30-day heart monitor after discharge-cardiology ordered 12/4 - follow up appt scheduled for 10/09/2024 LDL 35 HgbA1c 5.5 VTE prophylaxis - heparin  SQ No antithrombotic prior to admission, continue asa 81mg . Continue to hold DAPT for now due to severe anemia.  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  Hospital day # 7  Patient seen and examined by NP/APP with MD. MD to update note as needed.   Jorene Last, DNP, FNP-BC [facility] Pager: [phone]  ATTENDING NOTE: I reviewed above note and agree with the assessment and plan. Pt was seen and examined.   No family at bedside.  Patient lying bed, no acute event overnight, no complaints.  Awake alert.  Depakote  level 78 today, continue current dosing.  Hemoglobin also improving, if remains improved tomorrow, may start DAPT.  Still pending SNF placement.  Hemodialysis tomorrow  For detailed assessment and plan, please refer  to above as I have made changes wherever appropriate.   Ary Cummins, MD PhD Stroke Neurology 08/19/2024 6:18 PM

## 2024-08-19 NOTE — Progress Notes (Signed)
 Advised by CSW that pt is for possible d/c to snf tomorrow after HD. Contacted inpt HD unit to request 1st shift HD tomorrow if possible so pt can be d/c to snf in a timely manner. Will assist   Randine Mungo Dialysis Navigator [phone]

## 2024-08-19 NOTE — Plan of Care (Signed)
  Problem: Education: Goal: Knowledge of disease or condition will improve Outcome: Progressing Goal: Knowledge of secondary prevention will improve (MUST DOCUMENT ALL) Outcome: Progressing Goal: Knowledge of patient specific risk factors will improve (DELETE if not current risk factor) Outcome: Progressing   Problem: Ischemic Stroke/TIA Tissue Perfusion: Goal: Complications of ischemic stroke/TIA will be minimized Outcome: Progressing   Problem: Self-Care: Goal: Verbalization of feelings and concerns over difficulty with self-care will improve Outcome: Progressing

## 2024-08-19 NOTE — TOC Progression Note (Addendum)
 Transition of Care [facility]) - Progression Note    Patient Details  Name: Norma Coleman MRN: [id number] Date of Birth: 10-03-1932  Transition of Care [facility]) CM/SW Contact  Almarie CHRISTELLA [place], [place] Phone Number: 08/19/2024, 10:33 AM  Clinical Narrative:   CSW received contact from patient's daughter, Burnard, to choose [facility] for SNF. CSW confirmed bed availability with [facility], and contacted CMA to request insurance authorization. CSW to follow.   UPDATE: Patient's insurance has been approved, bed is available tomorrow. CSW spoke with daughter, Burnard, to update and answer questions. [place]. CSW to follow.    Expected Discharge Plan: Skilled Nursing Facility Barriers to Discharge: Continued Medical Work up, English As A Second Language Teacher               Expected Discharge Plan and Services       Living arrangements for the past 2 months: Single Family Home                                       Social Drivers of Health (SDOH) Interventions SDOH Screenings   Food Insecurity: No Food Insecurity (09/12/2022)  Housing: Low Risk  (09/12/2022)  Transportation Needs: No Transportation Needs (09/12/2022)  Utilities: Not At Risk (09/12/2022)  Tobacco Use: Low Risk  (05/09/2024)    Readmission Risk Interventions     No data to display

## 2024-08-20 DIAGNOSIS — R29701 NIHSS score 1: Secondary | ICD-10-CM

## 2024-08-20 LAB — TYPE AND SCREEN
ABO/RH(D): O POS
Antibody Screen: NEGATIVE
Unit division: 0
Unit division: 0

## 2024-08-20 LAB — CBC
HCT: 27.3 % — ABNORMAL LOW (ref 36.0–46.0)
Hemoglobin: 8.9 g/dL — ABNORMAL LOW (ref 12.0–15.0)
MCH: 32.2 pg (ref 26.0–34.0)
MCHC: 32.6 g/dL (ref 30.0–36.0)
MCV: 98.9 fL (ref 80.0–100.0)
Platelets: 129 K/uL — ABNORMAL LOW (ref 150–400)
RBC: 2.76 MIL/uL — ABNORMAL LOW (ref 3.87–5.11)
RDW: 14.4 % (ref 11.5–15.5)
WBC: 5.1 K/uL (ref 4.0–10.5)
nRBC: 0 % (ref 0.0–0.2)

## 2024-08-20 LAB — BPAM RBC
Blood Product Expiration Date: 202512082359
Blood Product Expiration Date: 202512302359
ISSUE DATE / TIME: 202512031327
ISSUE DATE / TIME: 202512061144
Unit Type and Rh: 5100
Unit Type and Rh: 9500

## 2024-08-20 LAB — BASIC METABOLIC PANEL WITH GFR
Anion gap: 11 (ref 5–15)
BUN: 32 mg/dL — ABNORMAL HIGH (ref 8–23)
CO2: 29 mmol/L (ref 22–32)
Calcium: 8.7 mg/dL — ABNORMAL LOW (ref 8.9–10.3)
Chloride: 93 mmol/L — ABNORMAL LOW (ref 98–111)
Creatinine, Ser: 5.58 mg/dL — ABNORMAL HIGH (ref 0.44–1.00)
GFR, Estimated: 7 mL/min — ABNORMAL LOW (ref >60)
Glucose, Bld: 85 mg/dL (ref 70–99)
Potassium: 3.7 mmol/L (ref 3.5–5.1)
Sodium: 133 mmol/L — ABNORMAL LOW (ref 135–145)

## 2024-08-20 LAB — VALPROIC ACID LEVEL: Valproic Acid Lvl: 112 ug/mL — ABNORMAL HIGH (ref 50–100)

## 2024-08-20 MED ORDER — SENNOSIDES-DOCUSATE SODIUM 8.6-50 MG PO TABS: 1.0000 | ORAL_TABLET | Freq: Every evening | ORAL | 0 refills | Status: DC | PRN

## 2024-08-20 MED ORDER — CLOPIDOGREL BISULFATE 75 MG PO TABS: 75.0000 mg | ORAL_TABLET | Freq: Every day | ORAL | 0 refills | Status: DC

## 2024-08-20 MED ORDER — DOXERCALCIFEROL 4 MCG/2ML IV SOLN: 6.0000 ug | INTRAVENOUS | 0 refills | Status: DC

## 2024-08-20 MED ORDER — POLYETHYLENE GLYCOL 3350 17 G PO PACK: 17.0000 g | PACK | Freq: Every day | ORAL | 0 refills | Status: DC | PRN

## 2024-08-20 MED ORDER — MELATONIN 3 MG PO TABS: 3.0000 mg | ORAL_TABLET | Freq: Every day | ORAL | 0 refills | Status: DC

## 2024-08-20 MED ORDER — DARBEPOETIN ALFA 100 MCG/0.5ML IJ SOSY: 100.0000 ug | PREFILLED_SYRINGE | INTRAMUSCULAR | 0 refills | Status: DC

## 2024-08-20 MED ORDER — DOXERCALCIFEROL 4 MCG/2ML IV SOLN
INTRAVENOUS | Status: AC
  Filled 2024-08-20: qty 4

## 2024-08-20 MED ORDER — MELATONIN 3 MG PO TABS: 3.0000 mg | ORAL_TABLET | Freq: Every day | ORAL | Status: DC

## 2024-08-20 MED ORDER — ACETAMINOPHEN 325 MG PO TABS: 650.0000 mg | ORAL_TABLET | ORAL | 0 refills | Status: AC | PRN

## 2024-08-20 MED ORDER — BISACODYL 10 MG RE SUPP: 10.0000 mg | Freq: Every day | RECTAL | 0 refills | Status: AC | PRN

## 2024-08-20 MED ORDER — SEVELAMER CARBONATE 800 MG PO TABS: 1600.0000 mg | ORAL_TABLET | Freq: Three times a day (TID) | ORAL | 0 refills | Status: DC

## 2024-08-20 MED ORDER — DIVALPROEX SODIUM 500 MG PO DR TAB: 500.0000 mg | DELAYED_RELEASE_TABLET | Freq: Two times a day (BID) | ORAL | 0 refills | Status: DC

## 2024-08-20 MED ORDER — CLOPIDOGREL BISULFATE 75 MG PO TABS
75.0000 mg | ORAL_TABLET | Freq: Every day | ORAL | Status: DC
  Administered 2024-08-20: 75 mg via ORAL
  Filled 2024-08-20 (×2): qty 1

## 2024-08-20 MED ORDER — ASPIRIN 81 MG PO TBEC: 81.0000 mg | DELAYED_RELEASE_TABLET | Freq: Every day | ORAL | 12 refills | Status: DC

## 2024-08-20 NOTE — Plan of Care (Signed)
°  Problem: Ischemic Stroke/TIA Tissue Perfusion: Goal: Complications of ischemic stroke/TIA will be minimized Outcome: Progressing   Problem: Coping: Goal: Will verbalize positive feelings about self Outcome: Progressing   Problem: Health Behavior/Discharge Planning: Goal: Ability to manage health-related needs will improve Outcome: Progressing   Problem: Self-Care: Goal: Ability to participate in self-care as condition permits will improve Outcome: Progressing   Problem: Self-Care: Goal: Ability to communicate needs accurately will improve Outcome: Progressing   Problem: Nutrition: Goal: Risk of aspiration will decrease Outcome: Progressing   Problem: Skin Integrity: Goal: Risk for impaired skin integrity will decrease Outcome: Progressing   Problem: Clinical Measurements: Goal: Will remain free from infection Outcome: Progressing   Problem: Activity: Goal: Risk for activity intolerance will decrease Outcome: Progressing   Problem: Pain Managment: Goal: General experience of comfort will improve and/or be controlled Outcome: Progressing   Problem: Safety: Goal: Ability to remain free from injury will improve Outcome: Progressing   Problem: Skin Integrity: Goal: Risk for impaired skin integrity will decrease Outcome: Progressing

## 2024-08-20 NOTE — Plan of Care (Signed)
 [facility] Patient Discharge Orders- [facility] CLINIC: [facility]  Patient's name: Norma Coleman Admit/DC Dates: 08/12/2024 - 08/20/2024  Discharge Diagnoses: Acute CVA.   Discharge to [facility] SNF for rehab.  New onset seizures.   Outpatient Dialysis Orders:  -Heparin : No change  -EDW No change   -Bath: No change   Anemia Aranesp : Given: Y    Date of last dose/amount: 12/5 100 mcg   PRBC's Given: Y Date/# of units:  12/6 1 unit  ESA dose for discharge: Mircera 100 mcg IV q 2 weeks   Recent Labs  Lab 08/14/24 0401 08/15/24 0338 08/19/24 0151 08/20/24 0233  HGB 7.7*   < > 8.6* 8.9*  K 4.3   < > 3.6 3.7  CALCIUM  8.3*   < > 8.8* 8.7*  PHOS 5.7*  --   --   --   ALBUMIN   --    < > 2.6*  --    < > = values in this interval not displayed.   Access intervention/Change:  none    Medications: -IV Antibiotics: none   -Anticoagulation:   OTHER/APPTS/LABS  Completed by: Maisie Ronnald Acosta PA-C   D/C Meds to be reconciled by nurse after every discharge.    Reviewed by: MD:______ RN_______

## 2024-08-20 NOTE — Progress Notes (Signed)
 Speech Language Pathology  Patient Details Name: Norma Coleman MRN: [id number] DOB: 1933/01/20 Today's Date: 08/20/2024 Time:  -     Pt currently in HD. Will attempt to see if able.     Dustin Olam Bull  08/20/2024, 8:52 AM

## 2024-08-20 NOTE — Progress Notes (Addendum)
 D/C order noted. Contacted [facility] to be advised of pt's d/c today to snf and that pt should resume care on Friday. SNF name provided to clinic. HD info added to AVS as well.   Randine Mungo Dialysis Navigator [phone]

## 2024-08-20 NOTE — TOC Transition Note (Signed)
 Transition of Care [facility]) - Discharge Note   Patient Details  Name: Norma Coleman MRN: [id number] Date of Birth: 1933/03/29  Transition of Care [facility]) CM/SW Contact:  Luise JAYSON Pan, LCSWA Phone Number: 08/20/2024, 1:27 PM   Clinical Narrative:   Patient will DC to: [facility] Anticipated DC date: 08/20/24  Family notified: Towana Sor (Daughter) [phone] Transport by: [facility]   Per MD patient ready for DC to [facility]. RN to call report prior to discharge [phone]; room 110). RN, patient, patient's family, and facility notified of DC. Discharge Summary and FL2 sent to facility. DC packet on chart. Ambulance transport requested for patient 1:28 PM.   CSW will sign off for now as social work intervention is no longer needed. Please consult us  again if new needs arise.      Final next level of care: Skilled Nursing Facility Barriers to Discharge: Barriers Resolved   Patient Goals and CMS Choice Patient states their goals for this hospitalization and ongoing recovery are:: to get better          Discharge Placement              Patient chooses bed at: [facility] Patient to be transferred to facility by: [facility] Name of family member notified: Towana Sor (Daughter) [phone] Patient and family notified of of transfer: 08/20/24  Discharge Plan and Services Additional resources added to the After Visit Summary for                                       Social Drivers of Health (SDOH) Interventions SDOH Screenings   Food Insecurity: No Food Insecurity (09/12/2022)  Housing: Low Risk  (09/12/2022)  Transportation Needs: No Transportation Needs (09/12/2022)  Utilities: Not At Risk (09/12/2022)  Tobacco Use: Low Risk  (05/09/2024)     Readmission Risk Interventions     No data to display

## 2024-08-20 NOTE — Progress Notes (Signed)
 PT Cancellation Note  Patient Details Name: Norma Coleman MRN: [id number] DOB: 03-11-1933   Cancelled Treatment:    Reason Eval/Treat Not Completed: Patient at procedure or test/unavailable (HD)  Norma Coleman, PT, DPT Acute Rehabilitation Services Office [phone]    Norma Coleman 08/20/2024, 9:12 AM

## 2024-08-20 NOTE — Progress Notes (Signed)
 Report given to Inocente, LPN at [facility]. All questions answered.

## 2024-08-20 NOTE — Progress Notes (Signed)
 [facility] Progress Note   Subjective:   Seen in dialysis unit. Didn't sleep well, due to back pain. Denies cp, sob For discharge to SNF today   Objective Vitals:   08/19/24 1513 08/19/24 2100 08/20/24 0010 08/20/24 0412  BP: (!) 154/93 (!) 154/87 (!) 147/67 131/67  Pulse: 83 79  89  Resp: 16 16 18 18   Temp: 97.9 F (36.6 C) 98 F (36.7 C) 98.4 F (36.9 C) 98.2 F (36.8 C)  TempSrc: Oral Oral Oral Oral  SpO2: 99% 97% 98% 99%  Weight:      Height:       Physical Exam General: awake alert and interactive Heart: RRR Lungs: clear on RA Abdomen:soft Extremities: no edema Neuro:  oriented to self, hosp, 2025 but hazy on details, very mild dysarthria noted Dialysis Access: LUE AVF +t/b  Additional Objective Labs: Basic Metabolic Panel: Recent Labs  Lab 08/14/24 0401 08/15/24 0338 08/18/24 0401 08/19/24 0151 08/20/24 0233  NA 132*   < > 134* 136 133*  K 4.3   < > 3.8 3.6 3.7  CL 95*   < > 92* 95* 93*  CO2 24   < > 26 29 29   GLUCOSE 77   < > 88 86 85  BUN 31*   < > 57* 21 32*  CREATININE 5.66*   < > 6.89* 4.02* 5.58*  CALCIUM  8.3*   < > 8.3* 8.8* 8.7*  PHOS 5.7*  --   --   --   --    < > = values in this interval not displayed.   Liver Function Tests: Recent Labs  Lab 08/18/24 0401 08/19/24 0151  AST 16 13*  ALT 8 9  ALKPHOS 212* 216*  BILITOT 0.9 0.9  PROT 4.9* 5.2*  ALBUMIN  2.6* 2.6*   No results for input(s): LIPASE, AMYLASE in the last 168 hours. CBC: Recent Labs  Lab 08/16/24 0152 08/16/24 1927 08/17/24 0615 08/18/24 0401 08/19/24 0151 08/20/24 0233  WBC 4.2  --  4.2 6.2 5.5 5.1  HGB 6.7*   < > 7.9* 7.9* 8.6* 8.9*  HCT 21.2*   < > 23.8* 23.5* 26.1* 27.3*  MCV 97.7  --  96.0 95.9 97.4 98.9  PLT 149*  --  137* 151 149* 129*   < > = values in this interval not displayed.   Blood Culture    Component Value Date/Time   SDES  08/05/2022 2230    BLOOD SITE NOT SPECIFIED Performed at [facility], [address], [place], [place] [address]    SPECREQUEST  08/05/2022 2230    BOTTLES DRAWN AEROBIC AND ANAEROBIC Blood Culture adequate volume Performed at [facility], [address] [place], [place] [address]    CULT  08/05/2022 2230    NO GROWTH 5 DAYS Performed at [facility], [address], [place], [place] [address]    REPTSTATUS 08/11/2022 FINAL 08/05/2022 2230    Cardiac Enzymes: No results for input(s): CKTOTAL, CKMB, CKMBINDEX, TROPONINI in the last 168 hours. CBG: Recent Labs  Lab 08/18/24 0355 08/18/24 0838 08/18/24 1720 08/18/24 2046 08/19/24 0616  GLUCAP 108* 93 179* 103* 92   Iron Studies:  No results for input(s): IRON, TIBC, TRANSFERRIN, FERRITIN in the last 72 hours.  @lablastinr3 @ Studies/Results: No results found.  Medications:  sodium chloride  Stopped (08/14/24 0243)    aspirin  EC  81 mg Oral Daily   Or   aspirin   300 mg Rectal Daily  atorvastatin   40 mg Oral Daily   Chlorhexidine  Gluconate Cloth  6 each Topical Q0600   darbepoetin (ARANESP ) injection - DIALYSIS  100 mcg Subcutaneous Q Fri-1800   divalproex   250 mg Oral Q24H   divalproex   500 mg Oral BID   doxercalciferol   6 mcg Intravenous Q T,Th,Sa-HD   feeding supplement (NEPRO CARB STEADY)  237 mL Oral BID BM   heparin  injection (subcutaneous)  5,000 Units Subcutaneous Q8H   levothyroxine   75 mcg Oral QAC breakfast   sevelamer  carbonate  1,600 mg Oral TID WC   sodium bicarbonate   650 mg Oral BID   sodium chloride  flush  3 mL Intravenous Once   Dialysis Orders:  [facility] MWF 3:15 350/600 EDW 57.5 kg 2K/2Ca AVF 16g Heparin  3000 +2000 midrun No ESA orders. 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 C/b new seizures.  Management per neuro.  Doing well.  2.  ESRD.  HD MW outpt per [facility] records though daughter told nurse yesterday she's TTS. HD Wed 3.  Volume/BP. BP stable.   No gross volume on exam. UF with HD. Standing post weight.  4.  Anemia.Hb down trended to 6s, iron replete.  Transfused 12/6 per primary 1 u with post Hb 8.3 >7.9>8.6 . Has hematoma at femoral access site but looks stable and no abd pain to suggest RP bleed.  Resumed ESA, dosed 12/5.    5.  Metabolic bone disease.  Follow Ca/Phos trend. On hectorol  and renvela  per home doses; 12/4 phos 5.7.   For discharge to SNF today.     Maisie Ronnald Acosta PA-C [facility] 08/20/2024,8:35 AM

## 2024-08-20 NOTE — Progress Notes (Signed)
 Patient returned from hemodialysis.  For discharge to [facility].  Alert and oriented X 4.  Vital signs stable,  IV and telemetry discontinued.  Report called to [facility] by Reena Harari.  Discharge instruction completed in discharge packet to [facility]

## 2024-08-20 NOTE — Discharge Summary (Addendum)
 Stroke Discharge Summary  Patient ID: Norma Coleman   MRN: [id number]      DOB: July 08, 1933  Date of Admission: 08/12/2024 Date of Discharge: 08/20/2024  Attending Physician:  Stroke, Md, MD Consultant(s):   Treatment Team:  Norine Manuelita LABOR, MD nephrology  Patient's PCP:  Corlis Pagan, NP  DISCHARGE PRIMARY DIAGNOSIS:   Stroke: right MCA small infarcts with R M2 occlusion s/p IR with TICI 3, etiology: likely cardioembolic   Secondary diagnosis Seizure ESRD on dialysis Severe anemia of chronic disease Hypertension Hyperlipidemia Hypothyroidism Meningioma  Allergies as of 08/20/2024       Reactions   Ibuprofen Other (See Comments)   Stomach bleed   Sulfa Antibiotics Other (See Comments)   Unknown         Medication List     STOP taking these medications    ALPRAZolam  1 MG tablet Commonly known as: XANAX        TAKE these medications    acetaminophen  325 MG tablet Commonly known as: TYLENOL  Take 2 tablets (650 mg total) by mouth every 4 (four) hours as needed for mild pain (pain score 1-3) (or temp > 37.5 C).   aspirin  EC 81 MG tablet Take 1 tablet (81 mg total) by mouth daily. Swallow whole.   atorvastatin  40 MG tablet Commonly known as: LIPITOR Take 40 mg by mouth daily.   bisacodyl  10 MG suppository Commonly known as: DULCOLAX Place 1 suppository (10 mg total) rectally daily as needed for moderate constipation.   clopidogrel  75 MG tablet Commonly known as: PLAVIX  Take 1 tablet (75 mg total) by mouth daily.   Darbepoetin Alfa  100 MCG/0.5ML Sosy injection Commonly known as: ARANESP  Inject 0.5 mLs (100 mcg total) into the skin every Friday at 6 PM. Start taking on: August 22, 2024   divalproex  500 MG DR tablet Commonly known as: DEPAKOTE  Take 1 tablet (500 mg total) by mouth 2 (two) times daily.   doxercalciferol  4 MCG/2ML injection Commonly known as: HECTOROL  Inject 3 mLs (6 mcg total) into the vein Every Tuesday,Thursday,and  Saturday with dialysis.   HYDROcodone -acetaminophen  5-325 MG tablet Commonly known as: NORCO/VICODIN Take 1 tablet by mouth every 6 (six) hours as needed for moderate pain (pain score 4-6) or severe pain (pain score 7-10).   levothyroxine  75 MCG tablet Commonly known as: SYNTHROID  Take 75 mcg by mouth daily before breakfast.   melatonin 3 MG Tabs tablet Take 1 tablet (3 mg total) by mouth at bedtime.   polyethylene glycol 17 g packet Commonly known as: MIRALAX  / GLYCOLAX  Take 17 g by mouth daily as needed for severe constipation.   senna-docusate 8.6-50 MG tablet Commonly known as: Senokot-S Take 1 tablet by mouth at bedtime as needed for mild constipation or moderate constipation.   sevelamer  carbonate 800 MG tablet Commonly known as: RENVELA  Take 2 tablets (1,600 mg total) by mouth 3 (three) times daily with meals. What changed: when to take this        LABORATORY STUDIES CBC    Component Value Date/Time   WBC 5.1 08/20/2024 0233   RBC 2.76 (L) 08/20/2024 0233   HGB 8.9 (L) 08/20/2024 0233   HCT 27.3 (L) 08/20/2024 0233   PLT 129 (L) 08/20/2024 0233   MCV 98.9 08/20/2024 0233   MCH 32.2 08/20/2024 0233   MCHC 32.6 08/20/2024 0233   RDW 14.4 08/20/2024 0233   LYMPHSABS 0.8 08/12/2024 1233   MONOABS 0.5 08/12/2024 1233   EOSABS 0.0 08/12/2024 1233  BASOSABS 0.0 08/12/2024 1233   CMP    Component Value Date/Time   NA 133 (L) 08/20/2024 0233   K 3.7 08/20/2024 0233   CL 93 (L) 08/20/2024 0233   CO2 29 08/20/2024 0233   GLUCOSE 85 08/20/2024 0233   BUN 32 (H) 08/20/2024 0233   CREATININE 5.58 (H) 08/20/2024 0233   CALCIUM  8.7 (L) 08/20/2024 0233   PROT 5.2 (L) 08/19/2024 0151   ALBUMIN  2.6 (L) 08/19/2024 0151   AST 13 (L) 08/19/2024 0151   ALT 9 08/19/2024 0151   ALKPHOS 216 (H) 08/19/2024 0151   BILITOT 0.9 08/19/2024 0151   GFRNONAA 7 (L) 08/20/2024 0233   COAGS Lab Results  Component Value Date   INR 1.0 08/12/2024   INR 1.1 08/05/2022   Lipid  Panel    Component Value Date/Time   CHOL 108 08/13/2024 0247   TRIG 128 08/13/2024 0247   HDL 47 08/13/2024 0247   CHOLHDL 2.3 08/13/2024 0247   VLDL 26 08/13/2024 0247   LDLCALC 35 08/13/2024 0247   HgbA1C  Lab Results  Component Value Date   HGBA1C 5.2 08/13/2024   Alcohol Level    Component Value Date/Time   [facility] <15 08/12/2024 1233     SIGNIFICANT DIAGNOSTIC STUDIES Overnight EEG with video Result Date: 08/14/2024 Shelton Arlin KIDD, MD     08/14/2024  9:03 AM Patient Name: Norma Coleman MRN: [id number] Epilepsy Attending: Arlin KIDD Shelton Referring Physician/Provider: Merrianne Locus, MD Duration: 08/13/2024 0834 to 08/13/2024 2329 Patient history: 89 y.o. female who presents with acute onset left-sided weakness, left hemianopsia and dysarthria. EEG to evaluate for seizure Level of alertness: Awake, asleep AEDs during EEG study: VPA Technical aspects: This EEG study was done with scalp electrodes positioned according to the 10-20 International system of electrode placement. Electrical activity was reviewed with band pass filter of 1-70Hz , sensitivity of 7 uV/mm, display speed of 80mm/sec with a 60Hz  notched filter applied as appropriate. EEG data were recorded continuously and digitally stored.  Video monitoring was available and reviewed as appropriate. Description: During awake state, no clear posterior dominant rhythm was seen. Sleep was characterized by sleep spindles (12 to 14 Hz), maximal frontocentral region.  EEG showed continuous disorganized 6 to 9 Hz theta- alpha activity in left hemisphere as well as continuous 3 to 5 Hz theta-delta slowing in right hemisphere. Hyperventilation and photic stimulation were not performed.    ABNORMALITY - Continuous slow, generalized and lateralized right hemisphere   IMPRESSION: This study is suggestive of cortical dysfunction in right hemisphere likely secondary to underlying structural abnormality. Additionally is generalized cerebral  dysfunction (encephalopathy). No seizures or definite epileptiform discharges were seen throughout the recording.  Arlin KIDD Shelton   MR BRAIN WO CONTRAST Result Date: 08/13/2024 EXAM: MR Angiography Head without intravenous Contrast. 08/13/2024 09:34:24 PM TECHNIQUE: Magnetic resonance angiography images of the head without intravenous contrast. Multiplanar 2D and 3D reformatted images are provided for review. COMPARISON: None provided. CLINICAL HISTORY: Neuro deficit, acute, stroke suspected; limited diffusion and flair imaging. FINDINGS: BRAIN PARENCHYMA: Intermediate-sized acute/early subacute infarct of the right frontal operculum and insula. Scattered punctate foci of acute ischemia in the left parietal lobe and right frontal white matter. Early confluent hyperintense T2-weighted signal within the cerebral white matter, most commonly due to chronic small vessel disease. Prominent. ANTERIOR CIRCULATION: No significant stenosis of the internal carotid arteries. No significant stenosis of the anterior cerebral arteries. No significant stenosis of the middle cerebral arteries. No aneurysm. POSTERIOR CIRCULATION: No  significant stenosis of the posterior cerebral arteries. No significant stenosis of the basilar artery. No significant stenosis of the vertebral arteries. No aneurysm. IMPRESSION: 1. Intermediate-sized acute/early subacute infarct of the right frontal operculum and insula. 2. Scattered punctate foci of acute ischemia in the left parietal lobe and right frontal white matter. 3. Early confluent hyperintense T2-weighted signal within the cerebral white matter, most commonly due to chronic small vessel disease. Electronically signed by: Franky Stanford MD 08/13/2024 10:58 PM EST RP Workstation: [id number]   MR ANGIO HEAD WO CONTRAST Result Date: 08/13/2024 EXAM: MR Angiography Head without intravenous Contrast. 08/13/2024 09:34:24 PM TECHNIQUE: Magnetic resonance angiography images of the head without  intravenous contrast. Multiplanar 2D and 3D reformatted images are provided for review. COMPARISON: None provided. CLINICAL HISTORY: Neuro deficit, acute, stroke suspected; limited diffusion and flair imaging. FINDINGS: BRAIN PARENCHYMA: Intermediate-sized acute/early subacute infarct of the right frontal operculum and insula. Scattered punctate foci of acute ischemia in the left parietal lobe and right frontal white matter. Early confluent hyperintense T2-weighted signal within the cerebral white matter, most commonly due to chronic small vessel disease. Prominent. ANTERIOR CIRCULATION: No significant stenosis of the internal carotid arteries. No significant stenosis of the anterior cerebral arteries. No significant stenosis of the middle cerebral arteries. No aneurysm. POSTERIOR CIRCULATION: No significant stenosis of the posterior cerebral arteries. No significant stenosis of the basilar artery. No significant stenosis of the vertebral arteries. No aneurysm. IMPRESSION: 1. Intermediate-sized acute/early subacute infarct of the right frontal operculum and insula. 2. Scattered punctate foci of acute ischemia in the left parietal lobe and right frontal white matter. 3. Early confluent hyperintense T2-weighted signal within the cerebral white matter, most commonly due to chronic small vessel disease. Electronically signed by: Franky Stanford MD 08/13/2024 10:58 PM EST RP Workstation: [id number]   CT HEAD WO CONTRAST ( ) Result Date: 08/13/2024 EXAM: CT HEAD WITH CONTRAST 08/13/2024 06:21:25 PM TECHNIQUE: CT of the head was performed with the administration of intravenous contrast. Automated exposure control, iterative reconstruction, and/or weight based adjustment of the mA/kV was utilized to reduce the radiation dose to as low as reasonably achievable. COMPARISON: 08/13/2024 CLINICAL HISTORY: Mental status change, persistent or worsening. FINDINGS: BRAIN AND VENTRICLES: Diffuse edema with loss of cortical  sulcation throughout the right MCA territory. Superimposed contrast contained within this region. Degree of edema and loss of gray-white matter differentiation is overall increased in size as compared to the infarct as seen on prior brain MRI, raising the concern for possible interval expansion of the right MCA territory infarct. Difficult to be certain of the extent of ischemia given the superimposed contrast and staining. No acute intracranial hemorrhage. No other significant findings regional mass effect or midline shift. No other new large vessel territory infarct. No other acute intracranial hemorrhage. No mass lesion or hydrocephalus. No acute fluid collection. Underlying advanced changes of chronic microvascular ischemic disease noted. ORBITS: No acute abnormality. SINUSES: No acute abnormality. SOFT TISSUES AND SKULL: No convincing abnormal hyperdense vessel. Calcified atherosclerosis present about the skull base. EEG leads overlying the scalp. No acute soft tissue abnormality. No skull fracture. IMPRESSION: 1. Evolving right MCA territory infarct with associated loss of gray-white matter differentiation and cortical sulcation, with persistent superimposed contrast scanning. Overall, the degree of gray-white matter differentiation loss appears increased as compared to prior MRI performed earlier the same day, potentially reflecting interval expansion of the right MCA territory infarct. Difficult to be certain of this finding given the superimposed contrast staining. Correlation with repeat brain MRI and  could be performed for further evaluation as warranted. 2. No acute intracranial hemorrhage. 3. No other new intracranial abnormality. 4. Underlying Advanced chronic microvascular ischemic changes. Electronically signed by: Morene Hoard MD 08/13/2024 07:36 PM EST RP Workstation: [id number]   ECHOCARDIOGRAM COMPLETE Result Date: 08/13/2024    ECHOCARDIOGRAM REPORT   Patient Name:   Norma Coleman  Date of Exam: 08/13/2024 Medical Rec #:  [id number]         Height:       63.0 in Accession #:    [id number]        Weight:       132.3 lb Date of Birth:  03/28/1933        BSA:          1.622 m Patient Age:    89 years          BP:           142/45 mmHg Patient Gender: F                 HR:           73 bpm. Exam Location:  Inpatient Procedure: 2D Echo, Cardiac Doppler and Color Doppler (Both Spectral and Color            Flow Doppler were utilized during procedure). Indications:    Stroke  History:        Patient has no prior history of Echocardiogram examinations.                 Cardiomegaly; Risk Factors:Dyslipidemia, Hypertension and                 Diabetes.  Sonographer:    Sherlean Dubin Referring Phys: [id number] CORTNEY E DE LA TORRE  Sonographer Comments: Image acquisition challenging due to respiratory motion and Image acquisition challenging due to patient body habitus. IMPRESSIONS  1. Left ventricular ejection fraction, by estimation, is 60 to 65%. The left ventricle has normal function. The left ventricle has no regional wall motion abnormalities. There is severe concentric left ventricular hypertrophy.  2. Right ventricular systolic function is normal. The right ventricular size is normal.  3. Left atrial size was moderately dilated.  4. The mitral valve is degenerative. No evidence of mitral valve regurgitation. No evidence of mitral stenosis. Moderate to severe mitral annular calcification.  5. The aortic valve was not well visualized. Aortic valve regurgitation is not visualized. No aortic stenosis is present.  6. The inferior vena cava is normal in size with greater than 50% respiratory variability, suggesting right atrial pressure of 3 mmHg. Comparison(s): No prior Echocardiogram. FINDINGS  Left Ventricle: Left ventricular ejection fraction, by estimation, is 60 to 65%. The left ventricle has normal function. The left ventricle has no regional wall motion abnormalities. The left ventricular  internal cavity size was normal in size. There is  severe concentric left ventricular hypertrophy. Left ventricular diastolic function could not be evaluated due to mitral annular calcification (moderate or greater). Right Ventricle: The right ventricular size is normal. No increase in right ventricular wall thickness. Right ventricular systolic function is normal. Left Atrium: Left atrial size was moderately dilated. Right Atrium: Right atrial size was normal in size. Pericardium: There is no evidence of pericardial effusion. Mitral Valve: The mitral valve is degenerative in appearance. Moderate to severe mitral annular calcification. No evidence of mitral valve regurgitation. No evidence of mitral valve stenosis. Tricuspid Valve: The tricuspid valve is normal in structure. Tricuspid valve regurgitation is mild .  No evidence of tricuspid stenosis. Aortic Valve: The aortic valve was not well visualized. Aortic valve regurgitation is not visualized. No aortic stenosis is present. Aortic valve mean gradient measures 8.5 mmHg. Aortic valve peak gradient measures 17.1 mmHg. Aortic valve area, by VTI measures 1.73 cm. Pulmonic Valve: The pulmonic valve was not well visualized. Pulmonic valve regurgitation is trivial. No evidence of pulmonic stenosis. Aorta: The aortic root and ascending aorta are structurally normal, with no evidence of dilitation. Venous: The inferior vena cava is normal in size with greater than 50% respiratory variability, suggesting right atrial pressure of 3 mmHg. IAS/Shunts: No atrial level shunt detected by color flow Doppler.  LEFT VENTRICLE PLAX 2D LVIDd:         5.20 cm   Diastology LVIDs:         3.50 cm   LV e' medial:    7.94 cm/s LV PW:         1.15 cm   LV E/e' medial:  16.5 LV IVS:        1.00 cm   LV e' lateral:   7.40 cm/s LVOT diam:     2.00 cm   LV E/e' lateral: 17.7 LV SV:         76 LV SV Index:   47 LVOT Area:     3.14 cm  RIGHT VENTRICLE             IVC RV Basal diam:  3.10 cm      IVC diam: 1.60 cm RV Mid diam:    2.00 cm RV S prime:     12.80 cm/s  PULMONARY VEINS TAPSE (M-mode): 1.8 cm      Diastolic Velocity: 24.80 cm/s                             S/D Velocity:       1.70                             Systolic Velocity:  42.70 cm/s LEFT ATRIUM             Index        RIGHT ATRIUM           Index LA diam:        4.55 cm 2.81 cm/m   RA Area:     16.70 cm LA Vol (A2C):   48.1 ml 29.65 ml/m  RA Volume:   38.70 ml  23.86 ml/m LA Vol (A4C):   82.2 ml 50.68 ml/m LA Biplane Vol: 66.7 ml 41.12 ml/m  AORTIC VALVE AV Area (Vmax):    1.70 cm AV Area (Vmean):   1.76 cm AV Area (VTI):     1.73 cm AV Vmax:           206.50 cm/s AV Vmean:          134.000 cm/s AV VTI:            0.441 m AV Peak Grad:      17.1 mmHg AV Mean Grad:      8.5 mmHg LVOT Vmax:         112.00 cm/s LVOT Vmean:        75.200 cm/s LVOT VTI:          0.244 m LVOT/AV VTI ratio: 0.55  AORTA Ao Root diam: 3.30 cm Ao Asc diam:  2.90 cm  MITRAL VALVE                TRICUSPID VALVE MV Area (PHT): 3.16 cm     TR Peak grad:   29.2 mmHg MV Decel Time: 240 msec     TR Vmax:        270.00 cm/s MV E velocity: 131.00 cm/s MV A velocity: 81.50 cm/s   SHUNTS MV E/A ratio:  1.61         Systemic VTI:  0.24 m                             Systemic Diam: 2.00 cm Joelle Cedars Tonleu Electronically signed by Joelle Cedars Tonleu Signature Date/Time: 08/13/2024/1:20:09 PM    Final    MR BRAIN WO CONTRAST Result Date: 08/13/2024 EXAM: MRI BRAIN WITHOUT CONTRAST 08/13/2024 09:26:18 AM TECHNIQUE: Multiplanar multisequence MRI of the head/brain was performed without the administration of intravenous contrast. COMPARISON: Comparison head CT 08/13/2024. CLINICAL HISTORY: Stroke, follow up. FINDINGS: The examination is mildly motion degraded. BRAIN AND VENTRICLES: There is a small acute right MCA infarct involving the frontoparietal operculum and insula with trace petechial hemorrhage. There is associated cytotoxic edema without mass effect.  Confluent T2 hyperintensities elsewhere in the cerebral white matter bilaterally are nonspecific but compatible with severe chronic small vessel ischemic disease. Mild chronic small vessel changes are present in the pons. A 1 cm partially calcified extra-axial mass is again noted in the right cerebellopontine angle with broad dural base along the tentorium. There is minimal mass effect on the right lateral pons without brain edema. There is mild cerebral atrophy. No midline shift, hydrocephalus, or extra axial fluid collection is evident. Major intracranial vascular flow voids are preserved. ORBITS: Bilateral cataract extraction. SINUSES AND MASTOIDS: No acute abnormality. BONES AND SOFT TISSUES: Normal marrow signal. No acute soft tissue abnormality. IMPRESSION: 1. Small acute right MCA infarct. 2. Severe chronic small vessel ischemic disease. 3. 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 Electronically signed by: Dasie Hamburg MD 08/13/2024 10:20 AM EST RP Workstation: [id number]   CT HEAD WO CONTRAST ( ) Result Date: 08/13/2024 EXAM: CT HEAD WITHOUT CONTRAST 08/13/2024 02:58:11 AM TECHNIQUE: CT of the head was performed without the administration of intravenous contrast. Automated exposure control, iterative reconstruction, and/or weight based adjustment of the mA/kV was utilized to reduce the radiation dose to as low as reasonably achievable. COMPARISON: Comparison with 08/12/2024. CLINICAL HISTORY: Seizure, new-onset, no history of trauma. FINDINGS: BRAIN AND VENTRICLES: No substantial change from prior. Mild hyperdensity over the right MCA territory compatible with contrast staining following neurointerventional procedure. Advanced chronic ischemic white matter change. Edema with loss of sulcation in the right cerebral hemisphere. No midline shift. Basal cisterns are patent. Unchanged 1 cm partially calcified extra-axial mass in the right cerebellar pontine angle compatible with  meningioma. No acute hemorrhage. No hydrocephalus. ORBITS: No acute abnormality. SINUSES: No acute abnormality. SOFT TISSUES AND SKULL: No acute soft tissue abnormality. No skull fracture. IMPRESSION: 1. No change from 12 / 2 / 25 at 8:44 pm. 2. Mild hyperdensity over the right MCA territory compatible with contrast staining following neurointerventional procedure. 3. Edema with loss of sulcation in the right cerebral hemisphere. No midline shift. 4. Unchanged 1 cm partially calcified extra-axial mass in the right cerebellar pontine angle compatible with meningioma. Electronically signed by: Norman Gatlin MD 08/13/2024 03:07 AM EST RP Workstation: [id number]   CT HEAD WO CONTRAST ( ) Result  Date: 08/12/2024 EXAM: CT HEAD WITHOUT CONTRAST 08/12/2024 08:47:00 PM TECHNIQUE: CT of the head was performed without the administration of intravenous contrast. Automated exposure control, iterative reconstruction, and/or weight based adjustment of the mA/kV was utilized to reduce the radiation dose to as low as reasonably achievable. COMPARISON: Head CT performed on 08/12/2024 at 12:37 pm. CLINICAL HISTORY: Neuro deficit, acute, stroke suspected; Worsened NIHSS. FINDINGS: BRAIN AND VENTRICLES: Mild hyperdensity over the right MCA territory likely contrast staining following neurointerventional procedure. There is advanced chronic ischemic white matter change. No acute hemorrhage. No evidence of acute infarct. No hydrocephalus. No extra-axial collection. No mass effect or midline shift. ORBITS: No acute abnormality. SINUSES: No acute abnormality. SOFT TISSUES AND SKULL: No acute soft tissue abnormality. No skull fracture. IMPRESSION: 1. Mild hyperdensity over the right MCA territory, likely contrast staining following neurointerventional procedure. 2. Advanced chronic ischemic white matter changes. Electronically signed by: Franky Stanford MD 08/12/2024 09:34 PM EST RP Workstation: [id number]   DG Chest Portable 1  View Result Date: 08/12/2024 CLINICAL DATA:  Clemens. EXAM: PORTABLE CHEST 1 VIEW COMPARISON:  09/13/2022 FINDINGS: Increased patient rotation to the right. Enlarged cardiac silhouette with improvement. Clear lungs with normal vascularity. Stable minimal chronic interstitial prominence. Minimally displaced right 6th lateral rib fracture, most likely old. IMPRESSION: 1. Minimally displaced right 6th lateral rib fracture, most likely old. 2. Cardiomegaly with improvement. Electronically Signed   By: Elspeth Bathe M.D.   On: 08/12/2024 16:50   IR PERCUTANEOUS ART THROMBECTOMY/INFUSION INTRACRANIAL INC DIAG ANGIO Result Date: 08/12/2024 PROCEDURE PERFORMED: 1. Cerebral angiography with stroke thrombectomy 2. Ultrasound guided vascular access, right common femoral artery 3. Cone beam CT for treatment planning 4. Ultrasound-guided vascular access, right radial artery COMPARISON:  CT angiogram of the head and neck performed August 12, 2024 CLINICAL DATA:  89 year old female with acute ischemic stroke and NIH stroke scale measuring 8. Symptoms were driven by hemi anopsia. An appropriate informed consent was obtained and the patient was taken for endovascular therapy. INDICATION: Acute ischemic stroke. ANESTHESIA/SEDATION: General anesthesia was utilized for the procedure. CONTRAST:  Approximately 70 cc Ominipque 300 MEDICATIONS: See MAR FLUOROSCOPY TIME:  Fluoroscopy Time: 55 minutes 6 sec, (981 mGy). COMPLICATIONS: None immediate. BODY OF REPORT: Following a full explanation of the procedure along with the potential associated complications, an informed witnessed consent was obtained. The patient was then placed under general anesthesia by the Department of Anesthesiology at [facility]. The right femoral access site was prepped and draped in the usual sterile fashion. Ultrasound was used to study the right common femoral artery which was patent. Using real-time ultrasound guidance, a 21 gauge introducer needle  was used to access the right common femoral artery. Access was performed at 1324. A hard copy image ultrasound the saved and stored in PACS. Using this access, a 6 French sheath was placed in the descending thoracic aorta. Next, selective catheterization the innominate artery was performed and a selective angiogram was performed which demonstrated a severely tortuous arch configuration. Multiple catheters and guidewires were utilized in an exhaustive attempt to gain stable access to the right carotid territory which were unsuccessful. Exchange was made for an 8 French sheath in order to provide improved support. A repeat approach with multiple catheters did not demonstrate significant improvement. At this point, I dedicated my effort to obtaining right radial access in an effort to approach the thrombectomy procedure from a right radial access. Ultrasound was used to study the right radial artery which was patent. Using real-time  ultrasound guidance, a 21 gauge needle was used to gain access to the right radial artery. A wire was placed. Next, a 7 French sheath was placed in the right radial artery. Next, a red 72 catheter was advanced into the right internal carotid artery. A selective angiogram was performed which demonstrated the right M1 occlusion. I was unable to advance the 72 catheter beyond the carotid siphon. Next, multiple microcatheter is were utilized in an effort to gain more distal access which were unsuccessful. This was primary secondary to arterial tortuosity and instability of the access system. At this point, Dr. Todd Burns joined me at my request. Exchange was made for a zoom 88 sheath system. After multiple attempts, he was able to catheterize the right internal carotid artery and advance this sheath system for more stable access. Next, a catalyst support catheter and Trevo Trak microcatheter were ultimately advanced into the right M2 segment. Next, a 4 mm solitaire was deployed. The first pass  was performed at 1525. Recanalization was achieved at this time. This yielded the occlusive thrombus and flow was restored to this segment. There was no evidence of active extravasation or complication. Post treatment TICI score 3. After reviewing the imaging, I elected to terminate the procedure at this point. Evaluation of the right femoral access site demonstrated that the site was suitable for a closure device. A 8 French Angio-Seal device was deployed without complication. A TR band was then deployed at the right radial access site. Cone beam CT was then performed to evaluate for intracranial hemorrhage and treatment planning. This demonstrated no evidence of significant acute intracranial hemorrhage. The patient was removed from anesthesia and transferred to recovery in stable condition. IMPRESSION: 1. Stent retriever assisted thrombectomy of right M1 occlusion with successful restoration of flow to the right MCA territory. PLAN: 1. To recovery for routine postoperative supportive care. Electronically Signed   By: Maude Naegeli M.D.   On: 08/12/2024 16:33   IR US  Guide Vasc Access Right Result Date: 08/12/2024 PROCEDURE PERFORMED: 1. Cerebral angiography with stroke thrombectomy 2. Ultrasound guided vascular access, right common femoral artery 3. Cone beam CT for treatment planning 4. Ultrasound-guided vascular access, right radial artery COMPARISON:  CT angiogram of the head and neck performed August 12, 2024 CLINICAL DATA:  89 year old female with acute ischemic stroke and NIH stroke scale measuring 8. Symptoms were driven by hemi anopsia. An appropriate informed consent was obtained and the patient was taken for endovascular therapy. INDICATION: Acute ischemic stroke. ANESTHESIA/SEDATION: General anesthesia was utilized for the procedure. CONTRAST:  Approximately 70 cc Ominipque 300 MEDICATIONS: See MAR FLUOROSCOPY TIME:  Fluoroscopy Time: 55 minutes 6 sec, (981 mGy). COMPLICATIONS: None immediate. BODY  OF REPORT: Following a full explanation of the procedure along with the potential associated complications, an informed witnessed consent was obtained. The patient was then placed under general anesthesia by the Department of Anesthesiology at [facility]. The right femoral access site was prepped and draped in the usual sterile fashion. Ultrasound was used to study the right common femoral artery which was patent. Using real-time ultrasound guidance, a 21 gauge introducer needle was used to access the right common femoral artery. Access was performed at 1324. A hard copy image ultrasound the saved and stored in PACS. Using this access, a 6 French sheath was placed in the descending thoracic aorta. Next, selective catheterization the innominate artery was performed and a selective angiogram was performed which demonstrated a severely tortuous arch configuration. Multiple catheters and guidewires  were utilized in an exhaustive attempt to gain stable access to the right carotid territory which were unsuccessful. Exchange was made for an 8 French sheath in order to provide improved support. A repeat approach with multiple catheters did not demonstrate significant improvement. At this point, I dedicated my effort to obtaining right radial access in an effort to approach the thrombectomy procedure from a right radial access. Ultrasound was used to study the right radial artery which was patent. Using real-time ultrasound guidance, a 21 gauge needle was used to gain access to the right radial artery. A wire was placed. Next, a 7 French sheath was placed in the right radial artery. Next, a red 72 catheter was advanced into the right internal carotid artery. A selective angiogram was performed which demonstrated the right M1 occlusion. I was unable to advance the 72 catheter beyond the carotid siphon. Next, multiple microcatheter is were utilized in an effort to gain more distal access which were unsuccessful. This  was primary secondary to arterial tortuosity and instability of the access system. At this point, Dr. Todd Burns joined me at my request. Exchange was made for a zoom 88 sheath system. After multiple attempts, he was able to catheterize the right internal carotid artery and advance this sheath system for more stable access. Next, a catalyst support catheter and Trevo Trak microcatheter were ultimately advanced into the right M2 segment. Next, a 4 mm solitaire was deployed. The first pass was performed at 1525. Recanalization was achieved at this time. This yielded the occlusive thrombus and flow was restored to this segment. There was no evidence of active extravasation or complication. Post treatment TICI score 3. After reviewing the imaging, I elected to terminate the procedure at this point. Evaluation of the right femoral access site demonstrated that the site was suitable for a closure device. A 8 French Angio-Seal device was deployed without complication. A TR band was then deployed at the right radial access site. Cone beam CT was then performed to evaluate for intracranial hemorrhage and treatment planning. This demonstrated no evidence of significant acute intracranial hemorrhage. The patient was removed from anesthesia and transferred to recovery in stable condition. IMPRESSION: 1. Stent retriever assisted thrombectomy of right M1 occlusion with successful restoration of flow to the right MCA territory. PLAN: 1. To recovery for routine postoperative supportive care. Electronically Signed   By: Maude Naegeli M.D.   On: 08/12/2024 16:33   IR CT Head Ltd Result Date: 08/12/2024 PROCEDURE PERFORMED: 1. Cerebral angiography with stroke thrombectomy 2. Ultrasound guided vascular access, right common femoral artery 3. Cone beam CT for treatment planning 4. Ultrasound-guided vascular access, right radial artery COMPARISON:  CT angiogram of the head and neck performed August 12, 2024 CLINICAL DATA:  89 year old  female with acute ischemic stroke and NIH stroke scale measuring 8. Symptoms were driven by hemi anopsia. An appropriate informed consent was obtained and the patient was taken for endovascular therapy. INDICATION: Acute ischemic stroke. ANESTHESIA/SEDATION: General anesthesia was utilized for the procedure. CONTRAST:  Approximately 70 cc Ominipque 300 MEDICATIONS: See MAR FLUOROSCOPY TIME:  Fluoroscopy Time: 55 minutes 6 sec, (981 mGy). COMPLICATIONS: None immediate. BODY OF REPORT: Following a full explanation of the procedure along with the potential associated complications, an informed witnessed consent was obtained. The patient was then placed under general anesthesia by the Department of Anesthesiology at [facility]. The right femoral access site was prepped and draped in the usual sterile fashion. Ultrasound was used to study the  right common femoral artery which was patent. Using real-time ultrasound guidance, a 21 gauge introducer needle was used to access the right common femoral artery. Access was performed at 1324. A hard copy image ultrasound the saved and stored in PACS. Using this access, a 6 French sheath was placed in the descending thoracic aorta. Next, selective catheterization the innominate artery was performed and a selective angiogram was performed which demonstrated a severely tortuous arch configuration. Multiple catheters and guidewires were utilized in an exhaustive attempt to gain stable access to the right carotid territory which were unsuccessful. Exchange was made for an 8 French sheath in order to provide improved support. A repeat approach with multiple catheters did not demonstrate significant improvement. At this point, I dedicated my effort to obtaining right radial access in an effort to approach the thrombectomy procedure from a right radial access. Ultrasound was used to study the right radial artery which was patent. Using real-time ultrasound guidance, a 21 gauge  needle was used to gain access to the right radial artery. A wire was placed. Next, a 7 French sheath was placed in the right radial artery. Next, a red 72 catheter was advanced into the right internal carotid artery. A selective angiogram was performed which demonstrated the right M1 occlusion. I was unable to advance the 72 catheter beyond the carotid siphon. Next, multiple microcatheter is were utilized in an effort to gain more distal access which were unsuccessful. This was primary secondary to arterial tortuosity and instability of the access system. At this point, Dr. Todd Burns joined me at my request. Exchange was made for a zoom 88 sheath system. After multiple attempts, he was able to catheterize the right internal carotid artery and advance this sheath system for more stable access. Next, a catalyst support catheter and Trevo Trak microcatheter were ultimately advanced into the right M2 segment. Next, a 4 mm solitaire was deployed. The first pass was performed at 1525. Recanalization was achieved at this time. This yielded the occlusive thrombus and flow was restored to this segment. There was no evidence of active extravasation or complication. Post treatment TICI score 3. After reviewing the imaging, I elected to terminate the procedure at this point. Evaluation of the right femoral access site demonstrated that the site was suitable for a closure device. A 8 French Angio-Seal device was deployed without complication. A TR band was then deployed at the right radial access site. Cone beam CT was then performed to evaluate for intracranial hemorrhage and treatment planning. This demonstrated no evidence of significant acute intracranial hemorrhage. The patient was removed from anesthesia and transferred to recovery in stable condition. IMPRESSION: 1. Stent retriever assisted thrombectomy of right M1 occlusion with successful restoration of flow to the right MCA territory. PLAN: 1. To recovery for routine  postoperative supportive care. Electronically Signed   By: Maude Naegeli M.D.   On: 08/12/2024 16:33   IR US  Guide Vasc Access Right Result Date: 08/12/2024 PROCEDURE PERFORMED: 1. Cerebral angiography with stroke thrombectomy 2. Ultrasound guided vascular access, right common femoral artery 3. Cone beam CT for treatment planning 4. Ultrasound-guided vascular access, right radial artery COMPARISON:  CT angiogram of the head and neck performed August 12, 2024 CLINICAL DATA:  89 year old female with acute ischemic stroke and NIH stroke scale measuring 8. Symptoms were driven by hemi anopsia. An appropriate informed consent was obtained and the patient was taken for endovascular therapy. INDICATION: Acute ischemic stroke. ANESTHESIA/SEDATION: General anesthesia was utilized for the procedure. CONTRAST:  Approximately 70 cc Ominipque 300 MEDICATIONS: See MAR FLUOROSCOPY TIME:  Fluoroscopy Time: 55 minutes 6 sec, (981 mGy). COMPLICATIONS: None immediate. BODY OF REPORT: Following a full explanation of the procedure along with the potential associated complications, an informed witnessed consent was obtained. The patient was then placed under general anesthesia by the Department of Anesthesiology at [facility]. The right femoral access site was prepped and draped in the usual sterile fashion. Ultrasound was used to study the right common femoral artery which was patent. Using real-time ultrasound guidance, a 21 gauge introducer needle was used to access the right common femoral artery. Access was performed at 1324. A hard copy image ultrasound the saved and stored in PACS. Using this access, a 6 French sheath was placed in the descending thoracic aorta. Next, selective catheterization the innominate artery was performed and a selective angiogram was performed which demonstrated a severely tortuous arch configuration. Multiple catheters and guidewires were utilized in an exhaustive attempt to gain stable access  to the right carotid territory which were unsuccessful. Exchange was made for an 8 French sheath in order to provide improved support. A repeat approach with multiple catheters did not demonstrate significant improvement. At this point, I dedicated my effort to obtaining right radial access in an effort to approach the thrombectomy procedure from a right radial access. Ultrasound was used to study the right radial artery which was patent. Using real-time ultrasound guidance, a 21 gauge needle was used to gain access to the right radial artery. A wire was placed. Next, a 7 French sheath was placed in the right radial artery. Next, a red 72 catheter was advanced into the right internal carotid artery. A selective angiogram was performed which demonstrated the right M1 occlusion. I was unable to advance the 72 catheter beyond the carotid siphon. Next, multiple microcatheter is were utilized in an effort to gain more distal access which were unsuccessful. This was primary secondary to arterial tortuosity and instability of the access system. At this point, Dr. Todd Burns joined me at my request. Exchange was made for a zoom 88 sheath system. After multiple attempts, he was able to catheterize the right internal carotid artery and advance this sheath system for more stable access. Next, a catalyst support catheter and Trevo Trak microcatheter were ultimately advanced into the right M2 segment. Next, a 4 mm solitaire was deployed. The first pass was performed at 1525. Recanalization was achieved at this time. This yielded the occlusive thrombus and flow was restored to this segment. There was no evidence of active extravasation or complication. Post treatment TICI score 3. After reviewing the imaging, I elected to terminate the procedure at this point. Evaluation of the right femoral access site demonstrated that the site was suitable for a closure device. A 8 French Angio-Seal device was deployed without complication. A TR  band was then deployed at the right radial access site. Cone beam CT was then performed to evaluate for intracranial hemorrhage and treatment planning. This demonstrated no evidence of significant acute intracranial hemorrhage. The patient was removed from anesthesia and transferred to recovery in stable condition. IMPRESSION: 1. Stent retriever assisted thrombectomy of right M1 occlusion with successful restoration of flow to the right MCA territory. PLAN: 1. To recovery for routine postoperative supportive care. Electronically Signed   By: Maude Naegeli M.D.   On: 08/12/2024 16:33   CT C-SPINE NO CHARGE Result Date: 08/12/2024 EXAM: CT CERVICAL SPINE WITHOUT CONTRAST 08/12/2024 01:44:26 PM TECHNIQUE: CT of the  cervical spine was performed without the administration of intravenous contrast. Multiplanar reformatted images are provided for review. Automated exposure control, iterative reconstruction, and/or weight based adjustment of the mA/kV was utilized to reduce the radiation dose to as low as reasonably achievable. COMPARISON: None available. CLINICAL HISTORY: Left sided weakness. FINDINGS: CERVICAL SPINE: BONES AND ALIGNMENT: Mild right convex curvature of the cervical spine. Degenerative appearing grade 1 anterolisthesis of C3 on C4 and C4 on C5. No acute fracture or suspicious lesion. DEGENERATIVE CHANGES: Severe disc space narrowing and degenerative endplate changes at C4-C5 and C5-C6 with moderate narrowing at C3-C4. Moderately advanced facet arthrosis on the left at C2-C3 and C3-C4 and bilaterally at C4-C5. No evidence of high grade stenosis. SOFT TISSUES: No prevertebral soft tissue swelling. IMPRESSION: 1. No acute osseous abnormality. 2. Multilevel disc and facet degeneration without evidence of high-grade stenosis. Electronically signed by: Dasie Hamburg MD 08/12/2024 02:54 PM EST RP Workstation: [id number]   CT ANGIO HEAD NECK W WO CM W PERF (CODE STROKE) Result Date: 08/12/2024 EXAM: CTA Head and  Neck with Perfusion 08/12/2024 01:44:26 PM TECHNIQUE: CTA of the head and neck was performed with the administration of intravenous contrast. 3D postprocessing with multiplanar reconstructions and MIPs was performed to evaluate the vascular anatomy. Cerebral perfusion analysis using computed tomography with contrast administration, including post-processing of parametric maps with determination of cerebral blood flow, cerebral blood volume, mean transit time and time-to-maximum. Automated exposure control, iterative reconstruction, and/or weight based adjustment of the mA/kV was utilized to reduce the radiation dose to as low as reasonably achievable. COMPARISON: None available CLINICAL HISTORY: Neuro deficit, acute, stroke suspected. FINDINGS: CTA NECK: AORTIC ARCH AND ARCH VESSELS: Calcified plaque in the aortic arch without a significant stenosis of the arch vessel origins. No dissection or arterial injury. CERVICAL CAROTID ARTERIES: Calcified plaque about the left greater than right carotid bifurcations. Tortuous proximal ICAs with a retropharyngeal course on the right. No dissection, arterial injury, or hemodynamically significant stenosis by NASCET criteria. CERVICAL VERTEBRAL ARTERIES: Dominant right vertebral artery. No dissection, arterial injury, or significant stenosis. LUNGS AND MEDIASTINUM: Mild biapical lung scarring. Enlarged main pulmonary artery measuring 3.9 cm in diameter which can be seen with pulmonary arterial hypertension. SOFT TISSUES: No acute abnormality. BONES: Cervical spine CT reported separately. CTA HEAD: ANTERIOR CIRCULATION: The intracranial internal carotid arteries are patent with mild atherosclerosis not resulting in significant stenosis. The right M1 segment is widely patent proximally, however at the bifurcation there is occlusion of the right M2 inferior division at its origin with moderate distal collateralization. The left MCA and both ACAs are patent without evidence of a  significant proximal stenosis. No aneurysm. POSTERIOR CIRCULATION: The intracranial vertebral arteries are patent to the basilar with mild atherosclerosis not resulting in a significant stenosis. Patent PICA, AICA, and SCA origins are visualized bilaterally. There are moderately large right and diminutive or absent left posterior communicating arteries. Both PCAs are patent without evidence of a significant proximal stenosis. No aneurysm. OTHER: No dural venous sinus thrombosis on this non-dedicated study. CT PERFUSION: EXAM QUALITY: Exam quality is adequate with diagnostic perfusion maps. No significant motion artifact. Appropriate arterial inflow and venous outflow curves. CORE INFARCT (CBF<30% volume): 0 mL TOTAL HYPOPERFUSION (Tmax>6s volume): 43 mL PENUMBRA: Mismatch volume: 43 mL Mismatch ratio: not applicable Location: right MCA territory Emergent results were discussed by telephone with Dr. CANDIE Mari at 12:55PM on 08/12/2024. IMPRESSION: 1. Occlusion of the right M2 inferior division at its origin. 2. Right MCA penumbra, with the  volume being slightly overestimated as the small core infarct visible on CT was not detected by perfusion processing. 3. Mild atherosclerosis elsewhere in the head and neck without a significant stenosis. Electronically signed by: Dasie Hamburg MD 08/12/2024 02:50 PM EST RP Workstation: [id number]   CT HEAD CODE STROKE WO CONTRAST Result Date: 08/12/2024 EXAM: CT HEAD WITHOUT CONTRAST TECHNIQUE: CT of the head was performed without the administration of intravenous contrast. Automated exposure control, iterative reconstruction, and/or weight based adjustment of the mA/kV was utilized to reduce the radiation dose to as low as reasonably achievable. COMPARISON: None available. CLINICAL HISTORY: Neuro deficit, acute, stroke suspected FINDINGS: BRAIN AND VENTRICLES: There is a small area of cortical hypodensity involving the inferior right frontoparietal operculum consistent with  an acute infarct. The right MCA is hyperdense. No intracranial hemorrhage, midline shift, hydrocephalus, or extra-axial fluid collection is identified. Mild cerebral atrophy is within normal limits for age. Confluent hypodensities in the cerebral white matter bilaterally are nonspecific but compatible with severe chronic small vessel ischemic disease. There is an approximately 1 cm partially calcified extra-axial mass in the right cerebellopontine angle cistern which has a broad dural base with the right tentorial leaflet. There is at most minimal mass effect on the right lateral pons. ORBITS: No acute abnormality. SINUSES: No acute abnormality. SOFT TISSUES AND SKULL: No acute soft tissue abnormality. No skull fracture. [place] Stroke Program Early CT Score (ASPECTS) ----- Ganglionic (caudate, IC, lentiform nucleus, insula, M1-M3): 6 Supraganglionic (M4-M6): 3 Total: 9 IMPRESSION: 1. Acute right MCA infarct involving the frontoparietal operculum. No hemorrhage. ASPECTS of 9. 2. Severe chronic small vessel ischemic disease. 3. 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 4. Emergent results were discussed by telephone with Dr. CANDIE Mari at 12:55 PM on 08/12/2024. Electronically signed by: Dasie Hamburg MD 08/12/2024 01:03 PM EST RP Workstation: [id number]       HISTORY OF PRESENT ILLNESS 89 y.o. patient with history of end-stage renal disease on dialysis, hypertension, hyperlipidemia and hypothyroidism was admitted with acute onset left-sided weakness, left hemianopsia and dysarthria.  HOSPITAL COURSE Patient was found to have left MCA M2 occlusion and was taken to interventional radiology for mechanical thrombectomy, which was successful.  She did have notable anemia during her hospitalization and was transfused with 2 units of packed red blood cells.  Hemoglobin is now stable, and DAPT has been restarted.  Patient is ready to be discharged to facility for  rehabilitation.  Stroke:  right MCA small infarcts with R M2 occlusion s/p IR with TICI 3, etiology:  likely cardioembolic source Code Stroke  CT head Acute right MCA infarct involving the frontoparietal operculum. Small vessel disease.  CTA head & neck Occlusion of the right M2 inferior division at its origin.  CT perfusion 0/43 with pseudonormalization so the core was underestimated  MRI: Small acute right MCA infarct. Severe chronic small vessel ischemic disease. Repeat CT head: Mild hyperdensity over the right MCA territory, likely contrast staining following neurointerventional procedure.   Repeat MRI scan brain shows stable ischemic appearance of the right MCA infarct without significant cytotoxic edema 2D Echo  EF 60-65%  Recommend 30-day heart monitor after discharge-cardiology ordered 12/4 - follow up appt scheduled for 10/09/2024 LDL 35 HgbA1c 5.5 VTE prophylaxis - heparin  SQ No antithrombotic prior to admission, on asa 81mg  and Plavix  DAPT for 3 weeks and then aspirin  alone Therapy recommendations:  SNF Disposition: SNF today   New onset focal seizures  Seizure precautions  Loaded  with Valproic  acid followed by 300/400/500 Q8h -> changed to 500/250/500 -> 500 bid on discharge VPA level low 51-> 115 -> 113->78-> 112 LTM EEG no seizure   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  RN Pressure Injury  Documentation:   DISCHARGE EXAM  PHYSICAL EXAM General:  Alert, well-nourished, well-developed patient in mild distress due to back pain Psych:  Mood and affect appropriate for situation CV: Regular rate and rhythm on monitor Respiratory:  Regular, unlabored respirations on room air  NEURO:  Mental Status: AA&Ox3, able to discuss current situation Speech/Language: speech is with very mild dysarthria and no aphasia  Cranial Nerves:  II: PERRL. Visual fields full, left hemianopia not noted today.  III, IV, VI: EOMI. Eyelids elevate symmetrically.  V: Sensation is intact to light touch and symmetrical to face.  VII: Smile is symmetrical, no facial droop noted today.  VIII: hearing intact to voice. IX, X: Phonation is normal.  KP:Dynloizm shrug 5/5. XII: tongue is midline without fasciculations. Motor: Able to move all 4 extremities with good antigravity strength, limited assessment of left upper extremity due to ongoing dialysis Tone: is normal and bulk is normal Sensation- Intact to light touch bilaterally.  Coordination: FTN intact on the right, deferred on the left Gait- deferred  1a Level of Conscious.: 0 1b LOC Questions: 0 1c LOC Commands: 0 2 Best Gaze: 0 3 Visual: 0 4 Facial Palsy: 0 5a Motor Arm - left: 0 5b Motor Arm - Right: 0 6a Motor Leg - Left: 0 6b Motor Leg - Right: 0 7 Limb Ataxia: 0 8 Sensory: 0 9 Best Language: 0 10 Dysarthria: 1 11 Extinct. and Inatten.: 0 TOTAL: 1   Discharge Diet       Diet   Diet renal/carb modified with fluid restriction Diet-HS Snack? Nothing; Fluid restriction: 1200 mL Fluid; Room service appropriate? Yes; Fluid consistency: Thin   liquids  DISCHARGE PLAN Disposition: Skilled nursing facility aspirin  81 mg daily and clopidogrel  75 mg daily for secondary stroke prevention for 3 weeks then aspirin  81 mg daily alone. Ongoing stroke risk factor control by Primary Care Physician at time of discharge Follow-up PCP Corlis Pagan, NP in 2 weeks. Follow-up in [facility] in 8 weeks, office to schedule an appointment.   35 minutes were spent preparing discharge.  Cortney E Everitt Clint Kill , MSN, AGACNP-BC [facility] See Amion for schedule and pager information 08/20/2024 11:19 AM  ATTENDING NOTE: I reviewed above note and agree with the assessment and plan. Pt was seen and examined.   No family bedside.  Patient neuro intact, no acute event overnight.  Had hemoglobin improved and stable, put on DAPT for 3 weeks and then aspirin  alone.  Continue statin.  Depakote  level 112, decrease Depakote  to 500 twice daily.  Had a dialysis today.  Medically ready for SNF placement.  Follow-up with [facility]  For detailed assessment and plan, please refer to above as I  have made changes wherever appropriate.   Ary Cummins, MD PhD Stroke Neurology 08/20/2024 3:50 PM

## 2024-08-25 ENCOUNTER — Encounter (HOSPITAL_COMMUNITY): Payer: Self-pay

## 2024-09-10 ENCOUNTER — Encounter: Payer: Self-pay | Admitting: *Deleted

## 2024-09-10 NOTE — Progress Notes (Signed)
 Patient enrolled for [employer] to ship a 30 day cardiac event monitor to [facility]. Letter with instructions mailed to facility.

## 2024-09-20 ENCOUNTER — Emergency Department (HOSPITAL_COMMUNITY)
Admission: EM | Admit: 2024-09-20 | Discharge: 2024-09-20 | Disposition: A | Discharge status: Home / Self Care | Attending: Emergency Medicine | Admitting: Emergency Medicine

## 2024-09-20 ENCOUNTER — Encounter (HOSPITAL_COMMUNITY): Payer: Self-pay

## 2024-09-20 ENCOUNTER — Other Ambulatory Visit: Payer: Self-pay

## 2024-09-20 DIAGNOSIS — Z7982 Long term (current) use of aspirin: Secondary | ICD-10-CM | POA: Insufficient documentation

## 2024-09-20 DIAGNOSIS — N390 Urinary tract infection, site not specified: Secondary | ICD-10-CM | POA: Insufficient documentation

## 2024-09-20 DIAGNOSIS — N939 Abnormal uterine and vaginal bleeding, unspecified: Secondary | ICD-10-CM | POA: Diagnosis present

## 2024-09-20 LAB — CBC WITH DIFFERENTIAL/PLATELET
Abs Immature Granulocytes: 0.02 K/uL (ref 0.00–0.07)
Basophils Absolute: 0.1 K/uL (ref 0.0–0.1)
Basophils Relative: 1 %
Eosinophils Absolute: 0 K/uL (ref 0.0–0.5)
Eosinophils Relative: 0 %
HCT: 43.9 % (ref 36.0–46.0)
Hemoglobin: 13.8 g/dL (ref 12.0–15.0)
Immature Granulocytes: 0 %
Lymphocytes Relative: 14 %
Lymphs Abs: 0.7 K/uL (ref 0.7–4.0)
MCH: 34.2 pg — ABNORMAL HIGH (ref 26.0–34.0)
MCHC: 31.4 g/dL (ref 30.0–36.0)
MCV: 108.7 fL — ABNORMAL HIGH (ref 80.0–100.0)
Monocytes Absolute: 0.5 K/uL (ref 0.1–1.0)
Monocytes Relative: 10 %
Neutro Abs: 3.9 K/uL (ref 1.7–7.7)
Neutrophils Relative %: 75 %
Platelets: ADEQUATE K/uL (ref 150–400)
RBC: 4.04 MIL/uL (ref 3.87–5.11)
RDW: 16 % — ABNORMAL HIGH (ref 11.5–15.5)
WBC: 5.2 K/uL (ref 4.0–10.5)
nRBC: 0 % (ref 0.0–0.2)

## 2024-09-20 LAB — URINALYSIS, W/ REFLEX TO CULTURE (INFECTION SUSPECTED)
Bacteria, UA: NONE SEEN
Bilirubin Urine: NEGATIVE
Glucose, UA: NEGATIVE mg/dL
Ketones, ur: NEGATIVE mg/dL
Nitrite: NEGATIVE
Protein, ur: >=300 mg/dL — AB
RBC / HPF: >50 RBC/hpf (ref 0–5)
Specific Gravity, Urine: 1.011 (ref 1.005–1.030)
WBC, UA: >50 WBC/hpf (ref 0–5)
pH: 7 (ref 5.0–8.0)

## 2024-09-20 MED ORDER — CEPHALEXIN 250 MG PO CAPS
500.0000 mg | ORAL_CAPSULE | Freq: Once | ORAL | Status: AC
  Administered 2024-09-20: 500 mg via ORAL
  Filled 2024-09-20: qty 2

## 2024-09-20 MED ORDER — CEPHALEXIN 500 MG PO CAPS: 500.0000 mg | ORAL_CAPSULE | Freq: Two times a day (BID) | ORAL | 0 refills | Status: DC

## 2024-09-20 NOTE — ED Triage Notes (Signed)
 C/O burning when urinating.

## 2024-09-20 NOTE — Discharge Instructions (Signed)
 Today you were seen for a urinary tract infection.  You have been prescribed an antibiotic called Keflex , please take this as directed.  Please follow-up with [facility] for further evaluation and workup.  Please return to the ED if you have worsening pain, uncontrollable vomiting, or fever that does not go down with Tylenol  or Motrin.  Thank you for letting us  treat you today. After reviewing your labs, I feel you are safe to go home. Please follow up with your PCP in the next several days and provide them with your records from this visit. Return to the Emergency Room if pain becomes severe or symptoms worsen.

## 2024-09-20 NOTE — ED Provider Notes (Signed)
 " [place] EMERGENCY DEPARTMENT AT [facility] Provider Note   CSN: [id number] Arrival date & time: 09/20/24  1326     Patient presents with: Vaginal Bleeding   Norma Coleman is a 89 y.o. female.  Presents today for vaginal bleeding x 1 week and dysuria.  Patient denies fever, trauma, flank pain, abdominal pain, chills, nausea, vomiting, dizziness, shortness of breath, or any other complaints at this time.  Patient denies any new sexual partners or recent sexual encounters.  Patient states she is not taking a blood thinner, but is taking a daily aspirin .    Vaginal Bleeding Associated symptoms: dysuria        Prior to Admission medications  Medication Sig Start Date End Date Taking? Authorizing Provider  cephALEXin  (KEFLEX ) 500 MG capsule Take 1 capsule (500 mg total) by mouth 2 (two) times daily. 09/20/24  Yes Aaden Buckman N, PA-C  acetaminophen  (TYLENOL ) 325 MG tablet Take 2 tablets (650 mg total) by mouth every 4 (four) hours as needed for mild pain (pain score 1-3) (or temp > 37.5 C). 08/20/24   de Clint Kill, Cortney E, NP  aspirin  EC 81 MG tablet Take 1 tablet (81 mg total) by mouth daily. Swallow whole. 08/20/24   de Clint Kill, Cortney E, NP  atorvastatin  (LIPITOR) 40 MG tablet Take 40 mg by mouth daily. 01/09/20   [provider]  bisacodyl  (DULCOLAX) 10 MG suppository Place 1 suppository (10 mg total) rectally daily as needed for moderate constipation. 08/20/24   de Clint Kill, Cortney E, NP  clopidogrel  (PLAVIX ) 75 MG tablet Take 1 tablet (75 mg total) by mouth daily. 08/20/24   de Clint Kill, Cortney E, NP  Darbepoetin Alfa  (ARANESP ) 100 MCG/0.5ML SOSY injection Inject 0.5 mLs (100 mcg total) into the skin every Friday at 6 PM. 08/22/24   de Clint Kill, Cortney E, NP  divalproex  (DEPAKOTE ) 500 MG DR tablet Take 1 tablet (500 mg total) by mouth 2 (two) times daily. 08/20/24   de Clint Kill, Cortney E, NP  doxercalciferol  (HECTOROL ) 4 MCG/2ML injection Inject 3 mLs  (6 mcg total) into the vein Every Tuesday,Thursday,and Saturday with dialysis. 08/20/24   de Clint Kill, Cortney E, NP  HYDROcodone -acetaminophen  (NORCO/VICODIN) 5-325 MG tablet Take 1 tablet by mouth every 6 (six) hours as needed for moderate pain (pain score 4-6) or severe pain (pain score 7-10).    [provider]  levothyroxine  (SYNTHROID ) 75 MCG tablet Take 75 mcg by mouth daily before breakfast. 06/30/22   [provider]  melatonin 3 MG TABS tablet Take 1 tablet (3 mg total) by mouth at bedtime. 08/20/24   de Clint Kill, Cortney E, NP  polyethylene glycol (MIRALAX  / GLYCOLAX ) 17 g packet Take 17 g by mouth daily as needed for severe constipation. 08/20/24   de Clint Kill, Cortney E, NP  senna-docusate (SENOKOT-S) 8.6-50 MG tablet Take 1 tablet by mouth at bedtime as needed for mild constipation or moderate constipation. 08/20/24   de Clint Kill, Cortney E, NP  sevelamer  carbonate (RENVELA ) 800 MG tablet Take 2 tablets (1,600 mg total) by mouth 3 (three) times daily with meals. 08/20/24   de Clint Kill, Cortney E, NP    Allergies: Ibuprofen and Sulfa antibiotics    Review of Systems  Genitourinary:  Positive for dysuria and vaginal bleeding.    Updated Vital Signs BP 105/63 (BP Location: Right Arm)   Pulse 85   Temp (!) 97.4 F (36.3 C) (Oral)  Resp 16   Ht 5' 1 (1.549 m)   Wt 55.3 kg   SpO2 96%   BMI 23.05 kg/m   Physical Exam Vitals and nursing note reviewed. Exam conducted with a chaperone present.  Constitutional:      General: She is not in acute distress.    Appearance: She is well-developed. She is not toxic-appearing.  HENT:     Head: Normocephalic and atraumatic.     Right Ear: External ear normal.     Left Ear: External ear normal.     Nose: Nose normal.     Mouth/Throat:     Mouth: Mucous membranes are moist.  Eyes:     Conjunctiva/sclera: Conjunctivae normal.  Cardiovascular:     Rate and Rhythm: Normal rate and regular rhythm.     Pulses: Normal  pulses.     Heart sounds: Normal heart sounds. No murmur heard. Pulmonary:     Effort: Pulmonary effort is normal. No respiratory distress.     Breath sounds: Normal breath sounds.  Abdominal:     General: There is no distension.     Palpations: Abdomen is soft.     Tenderness: There is no abdominal tenderness.  Genitourinary:    Exam position: Knee-chest position.     Labia:        Right: No rash.        Left: No rash.      Vagina: No prolapsed vaginal walls.     Cervix: No friability, erythema or cervical bleeding.     Uterus: No uterine prolapse.      Comments: Send vaginal atrophy noted on exam without bleeding. Musculoskeletal:        General: No swelling.     Cervical back: Neck supple.  Skin:    General: Skin is warm and dry.     Capillary Refill: Capillary refill takes less than 2 seconds.  Neurological:     General: No focal deficit present.     Mental Status: She is alert and oriented to person, place, and time.  Psychiatric:        Mood and Affect: Mood normal.     (all labs ordered are listed, but only abnormal results are displayed) Labs Reviewed  URINALYSIS, W/ REFLEX TO CULTURE (INFECTION SUSPECTED) - Abnormal; Notable for the following components:      Result Value   Color, Urine AMBER (*)    APPearance TURBID (*)    Hgb urine dipstick LARGE (*)    Protein, ur >=300 (*)    Leukocytes,Ua LARGE (*)    All other components within normal limits  CBC WITH DIFFERENTIAL/PLATELET - Abnormal; Notable for the following components:   MCV 108.7 (*)    MCH 34.2 (*)    RDW 16.0 (*)    All other components within normal limits  URINE CULTURE    EKG: None  Radiology: No results found.   Procedures   Medications Ordered in the ED  cephALEXin  (KEFLEX ) capsule 500 mg (has no administration in time range)                                    Medical Decision Making Amount and/or Complexity of Data Reviewed Labs: ordered.   This patient presents to the  ED for concern of vaginal bleeding and dysuria differential diagnosis includes UTI, kidney stone, atrophic vaginitis   Lab Tests:  I Ordered, and personally interpreted  labs.  The pertinent results include: CBC unremarkable, UA with large hemoglobin, large leukocytes, greater than 50 WBCs, greater than 50 RBCs   Medicines ordered and prescription drug management:  I ordered medication including Keflex     I have reviewed the patients home medicines and have made adjustments as needed   Problem List / ED Course:  Considered for mission or further workup however patient's vital signs, physical exam, and labs are reassuring.  Patient has no signs or symptoms concerning for septic stone, pyelonephritis, or sepsis at this time.  Patient treated for hemorrhagic cystitis with outpatient course of Keflex .  Patient to follow-up with urology for further evaluation workup.  Patient given return precautions.  I feel patient is safe for discharge at this time.       Final diagnoses:  Urinary tract infection with hematuria, site unspecified    ED Discharge Orders          Ordered    cephALEXin  (KEFLEX ) 500 MG capsule  2 times daily        09/20/24 1641               Francis Ileana SAILOR, PA-C 09/20/24 1643    Patsey Lot, MD 09/20/24 2050  "

## 2024-09-20 NOTE — ED Triage Notes (Signed)
 Pt POV with daughter d/t Vaginal bleeding for 1 week.

## 2024-09-22 LAB — URINE CULTURE: Culture: 60000 — AB

## 2024-09-22 NOTE — Progress Notes (Unsigned)
 " [facility] [address] [place]. [place] [address] [phone]       HOSPITAL FOLLOW UP NOTE  Ms. Norma Coleman Date of Birth:  December 14, 1932 Medical Record Number:  [id number]   Reason for Referral:  hospital stroke follow up    SUBJECTIVE:   CHIEF COMPLAINT:  No chief complaint on file.   HPI:   Norma Coleman is a 89 y.o. patient with history of end-stage renal disease on dialysis, hypertension, hyperlipidemia and hypothyroidism who presented to ED on 08/12/2024 with acute onset left-sided weakness, left hemianopsia and dysarthria.  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  Recommended 30-day cardiac monitor outpatient to evaluate for A-fib.  Recommended DAPT for 3 weeks and aspirin  alone and continuation of atorvastatin  40 mg daily.  Hospital course complicated by new onset focal seizures (episode of right gaze preference, facial twitching and unconsciousness) and discharged on Depakote  500 mg twice daily and severe anemia due to ESRD requiring transfusions.  Therapy recommended discharge to SNF with therapy needs.        PERTINENT IMAGING  Code Stroke  CT head Acute right MCA infarct involving the frontoparietal operculum. Small vessel disease.  CTA head & neck Occlusion of the right M2 inferior division at its origin.  CT perfusion 0/43 with pseudonormalization so the core was underestimated  MRI: Small acute right MCA infarct. Severe chronic small vessel ischemic disease. Repeat CT head: Mild hyperdensity over the right MCA territory, likely contrast staining following neurointerventional procedure.   Repeat MRI scan brain shows stable ischemic appearance of the right MCA infarct without significant cytotoxic edema 2D Echo  EF 60-65%  LTM EEG no seizure LDL 35 HgbA1c 5.5    ROS:   14 system review of systems performed and  negative with exception of ***  PMH:  Past Medical History:  Diagnosis Date   DVT (deep venous thrombosis) (HCC)    GI bleed due to NSAIDs    High cholesterol    Hypertension    Hyperthyroidism    Pneumonia    Skin cancer    Thyroid  disease     PSH:  Past Surgical History:  Procedure Laterality Date   A/V SHUNT INTERVENTION Left 05/08/2024   Procedure: A/V SHUNT INTERVENTION;  Surgeon: Sheree Penne Bruckner, MD;  Location: [facility];  Service: Cardiovascular;  Laterality: Left;   IR CT HEAD LTD  08/12/2024   IR FLUORO GUIDE CV LINE RIGHT  09/18/2022   IR PERCUTANEOUS ART THROMBECTOMY/INFUSION INTRACRANIAL INC DIAG ANGIO  08/12/2024   IR US  GUIDE VASC ACCESS RIGHT  09/18/2022   IR US  GUIDE VASC ACCESS RIGHT  08/12/2024   IR US  GUIDE VASC ACCESS RIGHT  08/12/2024   KNEE SURGERY     RADIOLOGY WITH ANESTHESIA N/A 08/12/2024   Procedure: RADIOLOGY WITH ANESTHESIA;  Surgeon: Radiologist, Medication, MD;  Location: [facility];  Service: Radiology;  Laterality: N/A;   TONSILLECTOMY     TUBAL LIGATION     VENOUS ANGIOPLASTY  05/08/2024   Procedure: VENOUS ANGIOPLASTY;  Surgeon: Sheree Penne Bruckner, MD;  Location: [facility];  Service: Cardiovascular;;    Social History:  Social History   Socioeconomic History   Marital status: Widowed    Spouse name: Not on file   Number of children: Not on file   Years of education: Not on file   Highest education level: Not on file  Occupational History   Not on file  Tobacco Use   Smoking status: Never   Smokeless tobacco: Never  Substance and Sexual Activity   Alcohol use: Never   Drug use: Never   Sexual activity: Not Currently  Other Topics Concern   Not on file  Social History Narrative   Not on file   Social Drivers of Health   Tobacco Use: Low Risk (09/20/2024)   Patient History    Smoking Tobacco Use: Never    Smokeless Tobacco Use: Never    Passive Exposure: Not on file  Financial Resource Strain: Not on file   Food Insecurity: No Food Insecurity (09/12/2022)   Hunger Vital Sign    Worried About Running Out of Food in the Last Year: Never true    Ran Out of Food in the Last Year: Never true  Transportation Needs: No Transportation Needs (09/12/2022)   PRAPARE - Administrator, Civil Service (Medical): No    Lack of Transportation (Non-Medical): No  Physical Activity: Not on file  Stress: Not on file  Social Connections: Not on file  Intimate Partner Violence: Not At Risk (09/12/2022)   Humiliation, Afraid, Rape, and Kick questionnaire    Fear of Current or Ex-Partner: No    Emotionally Abused: No    Physically Abused: No    Sexually Abused: No  Depression (PHQ2-9): Not on file  Alcohol Screen: Not on file  Housing: Low Risk (09/12/2022)   Housing    Last Housing Risk Score: 0  Utilities: Not At Risk (09/12/2022)   AHC Utilities    Threatened with loss of utilities: No  Health Literacy: Not on file    Family History: No family history on file.  Medications:  Medications Ordered Prior to Encounter[1]  Allergies:  Allergies[2]    OBJECTIVE:  Physical Exam  There were no vitals filed for this visit. There is no height or weight on file to calculate BMI. No results found.   General: well developed, well nourished, seated, in no evident distress Head: head normocephalic and atraumatic.   Neck: supple with no carotid or supraclavicular bruits Cardiovascular: regular rate and rhythm, no murmurs Musculoskeletal: no deformity Skin:  no rash/petichiae Vascular:  Normal pulses all extremities   Neurologic Exam Mental Status: Awake and fully alert. Oriented to place and time. Recent and remote memory intact. Attention span, concentration and fund of knowledge appropriate. Mood and affect appropriate.  Cranial Nerves: Fundoscopic exam reveals sharp disc margins. Pupils equal, briskly reactive to light. Extraocular movements full without nystagmus. Visual fields full to  confrontation. Hearing intact. Facial sensation intact. Face, tongue, palate moves normally and symmetrically.  Motor: Normal bulk and tone. Normal strength in all tested extremity muscles Sensory.: intact to touch , pinprick , position and vibratory sensation.  Coordination: Rapid alternating movements normal in all extremities. Finger-to-nose and heel-to-shin performed accurately bilaterally. Gait and Station: Arises from chair without difficulty. Stance is normal. Gait demonstrates normal stride length and balance with ***. Tandem walk and heel toe ***.  Reflexes: 1+ and symmetric. Toes downgoing.     NIHSS  *** Modified Rankin  ***      ASSESSMENT: Norma Coleman is a 89 y.o. 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  Also concern of new onset focal seizures and placed on Depakote .  Vascular risk factors include HTN, HLD, hypothyroidism, ESRD on HD, advanced  age.      PLAN:  Right MCA strokes:  Residual deficit: ***.  Continue aspirin  81mg  daily and atorvastatin  (Lipitor) for secondary stroke prevention managed/prescribed by PCP.   Discussed secondary stroke prevention measures and importance of close PCP follow up for aggressive stroke risk factor management including BP goal<130/90, HLD with LDL goal<70 and DM with A1c.<7 .  Stroke labs 08/2024: LDL 35, A1c 5.5 I have gone over the pathophysiology of stroke, warning signs and symptoms, risk factors and their management in some detail with instructions to go to the closest emergency room for symptoms of concern.  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 Rosemarie PCP: Corlis Pagan, NP    I  personally spent a total of *** minutes in the care of the patient today including {Time Based Coding:210964241}.    Harlene Bogaert, AGNP-BC  [facility] [address] [place], [place] [address]  Phone [phone] Fax [phone] Note: This document was prepared with digital dictation and possible smart phrase technology. Any transcriptional errors that result from this process are unintentional.         [1]  Current Outpatient Medications on File Prior to Visit  Medication Sig Dispense Refill   acetaminophen  (TYLENOL ) 325 MG tablet Take 2 tablets (650 mg total) by mouth every 4 (four) hours as needed for mild pain (pain score 1-3) (or temp > 37.5 C). 30 tablet 0   aspirin  EC 81 MG tablet Take 1 tablet (81 mg total) by mouth daily. Swallow whole. 30 tablet 12   atorvastatin  (LIPITOR) 40 MG tablet Take 40 mg by mouth daily.     bisacodyl  (DULCOLAX) 10 MG suppository Place 1 suppository (10 mg total) rectally daily as needed for moderate constipation. 12 suppository 0   cephALEXin  (KEFLEX ) 500 MG capsule Take 1 capsule (500 mg total) by mouth 2 (two) times daily. 20 capsule 0   clopidogrel  (PLAVIX ) 75 MG tablet Take 1 tablet (75 mg total) by mouth daily. 21 tablet 0   Darbepoetin Alfa  (ARANESP ) 100 MCG/0.5ML SOSY injection Inject 0.5 mLs (100 mcg total) into the skin every Friday at 6 PM. 4.2 mL 0   divalproex  (DEPAKOTE ) 500 MG DR tablet Take 1 tablet (500 mg total) by mouth 2 (two) times daily. 60 tablet 0   doxercalciferol  (HECTOROL ) 4 MCG/2ML injection Inject 3 mLs (6 mcg total) into the vein Every Tuesday,Thursday,and Saturday with dialysis. 2 mL 0   HYDROcodone -acetaminophen  (NORCO/VICODIN) 5-325 MG tablet Take 1 tablet by mouth every 6 (six) hours as needed for moderate pain (pain score 4-6) or severe pain (pain score 7-10).     levothyroxine  (SYNTHROID ) 75 MCG tablet Take 75 mcg by mouth daily before breakfast.     melatonin 3 MG TABS tablet  Take 1 tablet (3 mg total) by mouth at bedtime. 30 tablet 0   polyethylene glycol (MIRALAX  / GLYCOLAX ) 17 g packet Take 17 g by mouth daily as needed for severe constipation. 14 each 0   senna-docusate (SENOKOT-S) 8.6-50 MG tablet Take 1 tablet by mouth at bedtime as needed for mild constipation or moderate constipation. 30 tablet 0   sevelamer  carbonate (RENVELA ) 800 MG tablet Take 2 tablets (1,600 mg total) by mouth 3 (three) times daily with meals. 90 tablet  0   No current facility-administered medications on file prior to visit.  [2]  Allergies Allergen Reactions   Ibuprofen Other (See Comments)    Stomach bleed   Sulfa Antibiotics Other (See Comments)    Unknown    "

## 2024-09-23 ENCOUNTER — Telehealth (HOSPITAL_BASED_OUTPATIENT_CLINIC_OR_DEPARTMENT_OTHER): Payer: Self-pay | Admitting: *Deleted

## 2024-09-23 ENCOUNTER — Ambulatory Visit: Admitting: Adult Health

## 2024-09-23 ENCOUNTER — Encounter: Payer: Self-pay | Admitting: Adult Health

## 2024-09-23 VITALS — BP 114/64 | HR 58 | Ht 62.0 in | Wt 121.8 lb

## 2024-09-23 DIAGNOSIS — I63411 Cerebral infarction due to embolism of right middle cerebral artery: Secondary | ICD-10-CM | POA: Diagnosis not present

## 2024-09-23 DIAGNOSIS — Z09 Encounter for follow-up examination after completed treatment for conditions other than malignant neoplasm: Secondary | ICD-10-CM

## 2024-09-23 DIAGNOSIS — I639 Cerebral infarction, unspecified: Secondary | ICD-10-CM | POA: Diagnosis not present

## 2024-09-23 DIAGNOSIS — I499 Cardiac arrhythmia, unspecified: Secondary | ICD-10-CM | POA: Diagnosis not present

## 2024-09-23 DIAGNOSIS — R569 Unspecified convulsions: Secondary | ICD-10-CM | POA: Diagnosis not present

## 2024-09-23 DIAGNOSIS — Z5181 Encounter for therapeutic drug level monitoring: Secondary | ICD-10-CM

## 2024-09-23 MED ORDER — DIVALPROEX SODIUM 500 MG PO DR TAB: 500.0000 mg | DELAYED_RELEASE_TABLET | Freq: Two times a day (BID) | ORAL | 11 refills | Status: DC

## 2024-09-23 NOTE — Progress Notes (Signed)
 Okay, thank you!

## 2024-09-23 NOTE — Patient Instructions (Addendum)
 Continue working with therapy for hopeful ongoing recovery - if you are interested in pursuing outpatient therapy once home health is completed, please let me know   Will follow up with cardiology regarding cardiac monitor   Continue aspirin  81 mg daily  and atorvastatin  for secondary stroke prevention  Continue Depakote  500 mg twice daily for seizure prevention, will check lab work today  Continue to follow up with PCP regarding blood pressure and cholesterol management  Maintain strict control of hypertension with blood pressure goal below 130/90, diabetes with hemoglobin A1c goal below 7.0 % and cholesterol with LDL cholesterol (bad cholesterol) goal below 70 mg/dL.   Signs of a Stroke? Follow the BEFAST method:  Balance Watch for a sudden loss of balance, trouble with coordination or vertigo Eyes Is there a sudden loss of vision in one or both eyes? Or double vision?  Face: Ask the person to smile. Does one side of the face droop or is it numb?  Arms: Ask the person to raise both arms. Does one arm drift downward? Is there weakness or numbness of a leg? Speech: Ask the person to repeat a simple phrase. Does the speech sound slurred/strange? Is the person confused ? Time: If you observe any of these signs, call 911.      Followup in the future with me in 4 months or call earlier if needed       Thank you for coming to see us  at [facility]. I hope we have been able to provide you high quality care today.  You may receive a patient satisfaction survey over the next few weeks. We would appreciate your feedback and comments so that we may continue to improve ourselves and the health of our patients.

## 2024-09-23 NOTE — Telephone Encounter (Signed)
 Post ED Visit - Positive Culture Follow-up: Unsuccessful Patient Follow-up  Culture assessed and recommendations reviewed by:  [x]  Leonor Bash, Pharm.D. []  Venetia Gully, Pharm.D., BCPS AQ-ID []  Garrel Crews, Pharm.D., BCPS []  Almarie Lunger, Pharm.D., BCPS []  [place], [address].D., BCPS, AAHIVP []  Rosaline Bihari, Pharm.D., BCPS, AAHIVP []  Massie Rigg, PharmD []  Jodie Rower, PharmD, BCPS   Positive urine culture reviews by  [facility] Plan Stop Keflex  and start Amoxicillin 250 Mg  q 24 hours x 5 days. Take with food.  []  Patient discharged without antimicrobial prescription and treatment is now indicated [x]  Organism is resistant to prescribed ED discharge antimicrobial []  Patient with positive blood cultures   Unable to contact patient after 3 attempts, letter will be sent to address on file  Jama Wyman Kipper 09/23/2024, 11:37 AM

## 2024-09-24 ENCOUNTER — Ambulatory Visit: Payer: Self-pay | Admitting: Adult Health

## 2024-09-24 DIAGNOSIS — D696 Thrombocytopenia, unspecified: Secondary | ICD-10-CM

## 2024-09-24 LAB — CBC WITH DIFFERENTIAL/PLATELET
Basophils Absolute: 0 x10E3/uL (ref 0.0–0.2)
Basos: 1 %
EOS (ABSOLUTE): 0.1 x10E3/uL (ref 0.0–0.4)
Eos: 3 %
Hematocrit: 41.5 % (ref 34.0–46.6)
Hemoglobin: 13.3 g/dL (ref 11.1–15.9)
Immature Grans (Abs): 0 x10E3/uL (ref 0.0–0.1)
Immature Granulocytes: 0 %
Lymphocytes Absolute: 0.9 x10E3/uL (ref 0.7–3.1)
Lymphs: 29 %
MCH: 33.9 pg — ABNORMAL HIGH (ref 26.6–33.0)
MCHC: 32 g/dL (ref 31.5–35.7)
MCV: 106 fL — ABNORMAL HIGH (ref 79–97)
Monocytes Absolute: 0.2 x10E3/uL (ref 0.1–0.9)
Monocytes: 7 %
Neutrophils Absolute: 1.9 x10E3/uL (ref 1.4–7.0)
Neutrophils: 60 %
Platelets: 51 x10E3/uL — CL (ref 150–450)
RBC: 3.92 x10E6/uL (ref 3.77–5.28)
RDW: 13.8 % (ref 11.7–15.4)
WBC: 3.2 x10E3/uL — ABNORMAL LOW (ref 3.4–10.8)

## 2024-09-24 LAB — COMPREHENSIVE METABOLIC PANEL WITH GFR
ALT: 6 IU/L (ref 0–32)
AST: 13 IU/L (ref 0–40)
Albumin: 3.6 g/dL — ABNORMAL LOW (ref 3.7–4.7)
Alkaline Phosphatase: 295 IU/L — ABNORMAL HIGH (ref 48–129)
BUN/Creatinine Ratio: 4 — ABNORMAL LOW (ref 12–28)
BUN: 18 mg/dL (ref 8–27)
Bilirubin Total: 0.3 mg/dL (ref 0.0–1.2)
CO2: 25 mmol/L (ref 20–29)
Calcium: 8.8 mg/dL (ref 8.7–10.3)
Chloride: 99 mmol/L (ref 96–106)
Creatinine, Ser: 4.86 mg/dL — ABNORMAL HIGH (ref 0.57–1.00)
Globulin, Total: 2.6 g/dL (ref 1.5–4.5)
Glucose: 84 mg/dL (ref 70–99)
Potassium: 4.1 mmol/L (ref 3.5–5.2)
Sodium: 145 mmol/L — ABNORMAL HIGH (ref 134–144)
Total Protein: 6.2 g/dL (ref 6.0–8.5)
eGFR: 8 mL/min/1.73 — ABNORMAL LOW

## 2024-09-24 LAB — VALPROIC ACID LEVEL: Valproic Acid Lvl: 103 ug/mL — ABNORMAL HIGH (ref 50–100)

## 2024-09-24 MED ORDER — DIVALPROEX SODIUM ER 500 MG PO TB24: 500.0000 mg | ORAL_TABLET | Freq: Every day | ORAL | 5 refills | Status: DC

## 2024-09-25 ENCOUNTER — Telehealth: Payer: Self-pay | Admitting: Internal Medicine

## 2024-09-25 ENCOUNTER — Ambulatory Visit

## 2024-09-25 NOTE — Telephone Encounter (Signed)
 Pts daughter Burnard calling to ask if her and Rico Dixons can speak over the phone for today 3:00 appt. Please advise.

## 2024-09-25 NOTE — Telephone Encounter (Signed)
 If you are not here for the 3:00 PM appointment, I will give you a call to provide instructions over the phone.

## 2024-09-29 ENCOUNTER — Emergency Department (HOSPITAL_COMMUNITY)

## 2024-09-29 ENCOUNTER — Encounter (HOSPITAL_COMMUNITY): Payer: Self-pay

## 2024-09-29 ENCOUNTER — Other Ambulatory Visit: Payer: Self-pay

## 2024-09-29 ENCOUNTER — Encounter (HOSPITAL_COMMUNITY): Payer: Self-pay | Admitting: Internal Medicine

## 2024-09-29 ENCOUNTER — Inpatient Hospital Stay (HOSPITAL_COMMUNITY)
Admission: EM | Admit: 2024-09-29 | Discharge: 2024-10-07 | DRG: 091 | Disposition: A | Attending: Internal Medicine | Admitting: Internal Medicine

## 2024-09-29 DIAGNOSIS — E119 Type 2 diabetes mellitus without complications: Secondary | ICD-10-CM

## 2024-09-29 DIAGNOSIS — Z85828 Personal history of other malignant neoplasm of skin: Secondary | ICD-10-CM

## 2024-09-29 DIAGNOSIS — Z7982 Long term (current) use of aspirin: Secondary | ICD-10-CM

## 2024-09-29 DIAGNOSIS — I69319 Unspecified symptoms and signs involving cognitive functions following cerebral infarction: Secondary | ICD-10-CM

## 2024-09-29 DIAGNOSIS — E1122 Type 2 diabetes mellitus with diabetic chronic kidney disease: Secondary | ICD-10-CM | POA: Diagnosis not present

## 2024-09-29 DIAGNOSIS — E785 Hyperlipidemia, unspecified: Secondary | ICD-10-CM | POA: Diagnosis present

## 2024-09-29 DIAGNOSIS — I4891 Unspecified atrial fibrillation: Secondary | ICD-10-CM | POA: Diagnosis present

## 2024-09-29 DIAGNOSIS — Z87440 Personal history of urinary (tract) infections: Secondary | ICD-10-CM

## 2024-09-29 DIAGNOSIS — D696 Thrombocytopenia, unspecified: Secondary | ICD-10-CM | POA: Diagnosis present

## 2024-09-29 DIAGNOSIS — G9340 Encephalopathy, unspecified: Secondary | ICD-10-CM | POA: Diagnosis not present

## 2024-09-29 DIAGNOSIS — I10 Essential (primary) hypertension: Secondary | ICD-10-CM | POA: Diagnosis present

## 2024-09-29 DIAGNOSIS — Z515 Encounter for palliative care: Secondary | ICD-10-CM

## 2024-09-29 DIAGNOSIS — Z91158 Patient's noncompliance with renal dialysis for other reason: Secondary | ICD-10-CM

## 2024-09-29 DIAGNOSIS — I953 Hypotension of hemodialysis: Secondary | ICD-10-CM | POA: Diagnosis not present

## 2024-09-29 DIAGNOSIS — Z79890 Hormone replacement therapy (postmenopausal): Secondary | ICD-10-CM

## 2024-09-29 DIAGNOSIS — N186 End stage renal disease: Secondary | ICD-10-CM | POA: Diagnosis present

## 2024-09-29 DIAGNOSIS — Z8673 Personal history of transient ischemic attack (TIA), and cerebral infarction without residual deficits: Secondary | ICD-10-CM

## 2024-09-29 DIAGNOSIS — Z86718 Personal history of other venous thrombosis and embolism: Secondary | ICD-10-CM | POA: Diagnosis not present

## 2024-09-29 DIAGNOSIS — E11649 Type 2 diabetes mellitus with hypoglycemia without coma: Secondary | ICD-10-CM | POA: Diagnosis present

## 2024-09-29 DIAGNOSIS — D631 Anemia in chronic kidney disease: Secondary | ICD-10-CM | POA: Diagnosis present

## 2024-09-29 DIAGNOSIS — Z886 Allergy status to analgesic agent status: Secondary | ICD-10-CM

## 2024-09-29 DIAGNOSIS — I132 Hypertensive heart and chronic kidney disease with heart failure and with stage 5 chronic kidney disease, or end stage renal disease: Secondary | ICD-10-CM | POA: Diagnosis present

## 2024-09-29 DIAGNOSIS — N184 Chronic kidney disease, stage 4 (severe): Secondary | ICD-10-CM | POA: Diagnosis not present

## 2024-09-29 DIAGNOSIS — D72819 Decreased white blood cell count, unspecified: Secondary | ICD-10-CM | POA: Diagnosis present

## 2024-09-29 DIAGNOSIS — G9341 Metabolic encephalopathy: Principal | ICD-10-CM

## 2024-09-29 DIAGNOSIS — T381X6A Underdosing of thyroid hormones and substitutes, initial encounter: Secondary | ICD-10-CM | POA: Diagnosis present

## 2024-09-29 DIAGNOSIS — R54 Age-related physical debility: Secondary | ICD-10-CM | POA: Diagnosis present

## 2024-09-29 DIAGNOSIS — R262 Difficulty in walking, not elsewhere classified: Secondary | ICD-10-CM | POA: Diagnosis present

## 2024-09-29 DIAGNOSIS — Z91199 Patient's noncompliance with other medical treatment and regimen due to unspecified reason: Secondary | ICD-10-CM

## 2024-09-29 DIAGNOSIS — Z992 Dependence on renal dialysis: Secondary | ICD-10-CM

## 2024-09-29 DIAGNOSIS — T426X6A Underdosing of other antiepileptic and sedative-hypnotic drugs, initial encounter: Secondary | ICD-10-CM | POA: Diagnosis present

## 2024-09-29 DIAGNOSIS — Z66 Do not resuscitate: Secondary | ICD-10-CM | POA: Diagnosis present

## 2024-09-29 DIAGNOSIS — E039 Hypothyroidism, unspecified: Secondary | ICD-10-CM | POA: Diagnosis not present

## 2024-09-29 DIAGNOSIS — I5032 Chronic diastolic (congestive) heart failure: Secondary | ICD-10-CM | POA: Diagnosis not present

## 2024-09-29 DIAGNOSIS — Z8701 Personal history of pneumonia (recurrent): Secondary | ICD-10-CM

## 2024-09-29 DIAGNOSIS — E782 Mixed hyperlipidemia: Secondary | ICD-10-CM | POA: Diagnosis not present

## 2024-09-29 DIAGNOSIS — Z1152 Encounter for screening for COVID-19: Secondary | ICD-10-CM

## 2024-09-29 DIAGNOSIS — G928 Other toxic encephalopathy: Principal | ICD-10-CM | POA: Diagnosis present

## 2024-09-29 DIAGNOSIS — E7800 Pure hypercholesterolemia, unspecified: Secondary | ICD-10-CM | POA: Diagnosis present

## 2024-09-29 DIAGNOSIS — Z882 Allergy status to sulfonamides status: Secondary | ICD-10-CM

## 2024-09-29 DIAGNOSIS — G40909 Epilepsy, unspecified, not intractable, without status epilepticus: Secondary | ICD-10-CM | POA: Diagnosis present

## 2024-09-29 DIAGNOSIS — Z79899 Other long term (current) drug therapy: Secondary | ICD-10-CM

## 2024-09-29 DIAGNOSIS — R627 Adult failure to thrive: Secondary | ICD-10-CM | POA: Diagnosis present

## 2024-09-29 DIAGNOSIS — F419 Anxiety disorder, unspecified: Secondary | ICD-10-CM | POA: Diagnosis present

## 2024-09-29 LAB — COMPREHENSIVE METABOLIC PANEL WITH GFR
ALT: <5 U/L (ref 0–44)
AST: 22 U/L (ref 15–41)
Albumin: 2.4 g/dL — ABNORMAL LOW (ref 3.5–5.0)
Alkaline Phosphatase: 200 U/L — ABNORMAL HIGH (ref 38–126)
Anion gap: 14 (ref 5–15)
BUN: 51 mg/dL — ABNORMAL HIGH (ref 8–23)
CO2: 21 mmol/L — ABNORMAL LOW (ref 22–32)
Calcium: 6.3 mg/dL — CL (ref 8.9–10.3)
Chloride: 110 mmol/L (ref 98–111)
Creatinine, Ser: 6.49 mg/dL — ABNORMAL HIGH (ref 0.44–1.00)
GFR, Estimated: 6 mL/min — ABNORMAL LOW
Glucose, Bld: 66 mg/dL — ABNORMAL LOW (ref 70–99)
Potassium: 3.9 mmol/L (ref 3.5–5.1)
Sodium: 145 mmol/L (ref 135–145)
Total Bilirubin: <0.2 mg/dL (ref 0.0–1.2)
Total Protein: 4.3 g/dL — ABNORMAL LOW (ref 6.5–8.1)

## 2024-09-29 LAB — RESP PANEL BY RT-PCR (RSV, FLU A&B, COVID)  RVPGX2
Influenza A by PCR: NEGATIVE
Influenza B by PCR: NEGATIVE
Resp Syncytial Virus by PCR: NEGATIVE
SARS Coronavirus 2 by RT PCR: NEGATIVE

## 2024-09-29 LAB — HEPATITIS B SURFACE ANTIGEN: Hepatitis B Surface Ag: NONREACTIVE

## 2024-09-29 LAB — CBC
HCT: 40.2 % (ref 36.0–46.0)
Hemoglobin: 12.8 g/dL (ref 12.0–15.0)
MCH: 34 pg (ref 26.0–34.0)
MCHC: 31.8 g/dL (ref 30.0–36.0)
MCV: 106.9 fL — ABNORMAL HIGH (ref 80.0–100.0)
Platelets: 54 K/uL — ABNORMAL LOW (ref 150–400)
RBC: 3.76 MIL/uL — ABNORMAL LOW (ref 3.87–5.11)
RDW: 15.9 % — ABNORMAL HIGH (ref 11.5–15.5)
WBC: 2.3 K/uL — ABNORMAL LOW (ref 4.0–10.5)
nRBC: 0 % (ref 0.0–0.2)

## 2024-09-29 LAB — GLUCOSE, CAPILLARY: Glucose-Capillary: 78 mg/dL (ref 70–99)

## 2024-09-29 LAB — VALPROIC ACID LEVEL: Valproic Acid Lvl: 70 ug/mL (ref 50–100)

## 2024-09-29 LAB — MAGNESIUM: Magnesium: 1.8 mg/dL (ref 1.7–2.4)

## 2024-09-29 LAB — CBG MONITORING, ED
Glucose-Capillary: 64 mg/dL — ABNORMAL LOW (ref 70–99)
Glucose-Capillary: 78 mg/dL (ref 70–99)
Glucose-Capillary: 91 mg/dL (ref 70–99)

## 2024-09-29 LAB — PRO BRAIN NATRIURETIC PEPTIDE: Pro Brain Natriuretic Peptide: 5252 pg/mL — ABNORMAL HIGH

## 2024-09-29 LAB — AMMONIA: Ammonia: 31 umol/L (ref 9–35)

## 2024-09-29 LAB — TSH: TSH: 90 u[IU]/mL — ABNORMAL HIGH (ref 0.350–4.500)

## 2024-09-29 MED ORDER — ATORVASTATIN CALCIUM 40 MG PO TABS
40.0000 mg | ORAL_TABLET | Freq: Every day | ORAL | Status: DC
  Administered 2024-10-01: 40 mg via ORAL
  Filled 2024-09-29 (×2): qty 1

## 2024-09-29 MED ORDER — ASPIRIN 81 MG PO TBEC
81.0000 mg | DELAYED_RELEASE_TABLET | Freq: Every day | ORAL | Status: DC
  Administered 2024-10-01: 81 mg via ORAL
  Filled 2024-09-29 (×2): qty 1

## 2024-09-29 MED ORDER — APIXABAN 2.5 MG PO TABS
2.5000 mg | ORAL_TABLET | Freq: Two times a day (BID) | ORAL | Status: DC
  Administered 2024-09-29 – 2024-10-01 (×3): 2.5 mg via ORAL
  Filled 2024-09-29 (×3): qty 1

## 2024-09-29 MED ORDER — LEVOTHYROXINE SODIUM 75 MCG PO TABS
75.0000 ug | ORAL_TABLET | Freq: Every day | ORAL | Status: DC
  Administered 2024-09-30: 75 ug via ORAL
  Filled 2024-09-29: qty 1

## 2024-09-29 MED ORDER — DIVALPROEX SODIUM ER 500 MG PO TB24
500.0000 mg | ORAL_TABLET | Freq: Every day | ORAL | Status: DC
  Administered 2024-10-01 – 2024-10-07 (×4): 500 mg via ORAL
  Filled 2024-09-29 (×8): qty 1

## 2024-09-29 MED ORDER — INSULIN ASPART 100 UNIT/ML IJ SOLN
0.0000 [IU] | Freq: Three times a day (TID) | INTRAMUSCULAR | Status: DC
  Filled 2024-09-29: qty 1

## 2024-09-29 MED ORDER — HEPARIN SODIUM (PORCINE) 5000 UNIT/ML IJ SOLN
5000.0000 [IU] | Freq: Three times a day (TID) | INTRAMUSCULAR | Status: DC
  Filled 2024-09-29: qty 1

## 2024-09-29 MED ORDER — MELATONIN 3 MG PO TABS: 3.0000 mg | ORAL_TABLET | Freq: Every evening | ORAL | Status: DC | PRN

## 2024-09-29 MED ORDER — ACETAMINOPHEN 650 MG RE SUPP: 650.0000 mg | Freq: Four times a day (QID) | RECTAL | Status: DC | PRN

## 2024-09-29 MED ORDER — CHLORHEXIDINE GLUCONATE CLOTH 2 % EX PADS
6.0000 | MEDICATED_PAD | Freq: Every day | CUTANEOUS | Status: DC
  Administered 2024-09-30 – 2024-10-01 (×2): 6 via TOPICAL

## 2024-09-29 MED ORDER — SODIUM CHLORIDE 0.9% FLUSH: 3.0000 mL | Freq: Two times a day (BID) | INTRAVENOUS | Status: DC

## 2024-09-29 MED ORDER — POLYETHYLENE GLYCOL 3350 17 G PO PACK: 17.0000 g | PACK | Freq: Every day | ORAL | Status: AC | PRN

## 2024-09-29 MED ORDER — ACETAMINOPHEN 325 MG PO TABS
650.0000 mg | ORAL_TABLET | Freq: Four times a day (QID) | ORAL | Status: DC | PRN
  Administered 2024-10-02: 650 mg via ORAL
  Filled 2024-09-29: qty 2

## 2024-09-29 NOTE — ED Triage Notes (Addendum)
 Patient from home where she has been living with daughter s/p rehab from recent CVA in December. Dx with UTI last week and taking abx for same without missing any doses. Daughter reports AMS, generalized weakness, and poor PO intake x 1 week. A/O x 2, normally full oriented. Dialysis patient, missed Friday and today's treatment d/t weakness.

## 2024-09-29 NOTE — H&P (Signed)
 " History and Physical   Norma Coleman FMW:[id number] DOB: 02-Sep-1933 DOA: 09/29/2024  PCP: Corlis Pagan, NP   Patient coming from: Home  Chief Complaint: Altered mental status  HPI: Norma Coleman is a 89 y.o. female with medical history significant of hypertension, hyperlipidemia, diabetes, hypothyroidism, CVA, chronic diastolic CHF, ESRD on HD, DVT presenting with altered mental status.  Patient has had worsening weakness and confusion as well as associated lethargy/sleepiness.  Patient was admitted to the neurology service 12/2-12/10 after acute CVA status post mechanical thrombectomy.  Complicated by new onset seizures for which patient was started on valproic  acid.  Also had some issues with anemia for which Norma Coleman received 2 units of PRBC transfusion.  Was discharged to rehab on dual antiplatelet therapy with plan for 30-day heart monitor.  Patient had been doing okay other than treatment for a UTI for which Norma Coleman was started on Keflex  in the ED on 1/10.  Followed up with neurology for 5 days ago and lab workup showed low platelets and low white count so valproic  acid dose was adjusted and plan for repeat labs in a week and a half from now. (Family has not gotten the message for dose adjustment and have been continuing at the original dose.)  Patient has developed worsening weakness, confusion, lethargy for the past week or so.  Norma Coleman missed dialysis 2 to diarrhea on Friday and due to her generalized weakness today.  Is gone to the point where Norma Coleman can no longer walk with a walker.  Daughter reports patient is likely missing some doses of her thyroid  medication due to that being the 1 medication that the daughter does not directly supervise based on timing.  Patient denies specific symptoms but remains somewhat confused.  ED Course: Vital signs in ED stable.  Lab workup included CMP with bicarb 21, BUN 51, creatinine 6.49, glucose 66, calcium  6.3 which corrects to 7.6 considering albumin   of 2.4, protein 4.3, alk phos stable at 200.  CBC with platelets stable from 5 days ago at 54 and leukopenia mildly worse at 2.3.  proBNP elevated to 5252.  Rester panel for flu COVID and RSV pending.  Urinalysis pending.  Magnesium  normal.  Imaging included chest x-ray which showed mild cardiomegaly and no acute abnormality.  CT head showed no acute abnormality and expected evolution of prior infarct.  No initial interventions in the ED.  Nephrology consulted for dialysis.  Review of Systems: Patient denies specific symptoms but remains somewhat confused.  Past Medical History:  Diagnosis Date   DVT (deep venous thrombosis) (HCC)    GI bleed due to NSAIDs    High cholesterol    Hypertension    Hyperthyroidism    Pneumonia    Skin cancer    Thyroid  disease     Past Surgical History:  Procedure Laterality Date   A/V SHUNT INTERVENTION Left 05/08/2024   Procedure: A/V SHUNT INTERVENTION;  Surgeon: Sheree Penne Bruckner, MD;  Location: [facility];  Service: Cardiovascular;  Laterality: Left;   IR CT HEAD LTD  08/12/2024   IR FLUORO GUIDE CV LINE RIGHT  09/18/2022   IR PERCUTANEOUS ART THROMBECTOMY/INFUSION INTRACRANIAL INC DIAG ANGIO  08/12/2024   IR US  GUIDE VASC ACCESS RIGHT  09/18/2022   IR US  GUIDE VASC ACCESS RIGHT  08/12/2024   IR US  GUIDE VASC ACCESS RIGHT  08/12/2024   KNEE SURGERY     RADIOLOGY WITH ANESTHESIA N/A 08/12/2024   Procedure: RADIOLOGY WITH ANESTHESIA;  Surgeon: Radiologist, Medication,  MD;  Location: [facility];  Service: Radiology;  Laterality: N/A;   TONSILLECTOMY     TUBAL LIGATION     VENOUS ANGIOPLASTY  05/08/2024   Procedure: VENOUS ANGIOPLASTY;  Surgeon: Sheree Penne Bruckner, MD;  Location: [facility];  Service: Cardiovascular;;    Social History  reports that Norma Coleman has never smoked. Norma Coleman has never used smokeless tobacco. Norma Coleman reports that Norma Coleman does not drink alcohol  and does not use drugs.  Allergies[1]  Family History  Problem Relation Age of  Onset   Stroke Neg Hx   Reviewed on admission  Prior to Admission medications  Medication Sig Start Date End Date Taking? Authorizing Provider  acetaminophen  (TYLENOL ) 325 MG tablet Take 2 tablets (650 mg total) by mouth every 4 (four) hours as needed for mild pain (pain score 1-3) (or temp > 37.5 C). 08/20/24   de Clint Kill, Cortney E, NP  aspirin  EC 81 MG tablet Take 1 tablet (81 mg total) by mouth daily. Swallow whole. 08/20/24   de Clint Kill, Cortney E, NP  atorvastatin  (LIPITOR) 40 MG tablet Take 40 mg by mouth daily. 01/09/20   [provider]  bisacodyl  (DULCOLAX) 10 MG suppository Place 1 suppository (10 mg total) rectally daily as needed for moderate constipation. 08/20/24   de Clint Kill, Cortney E, NP  cephALEXin  (KEFLEX ) 500 MG capsule Take 1 capsule (500 mg total) by mouth 2 (two) times daily. 09/20/24   Keith, Kayla N, PA-C  Darbepoetin Alfa  (ARANESP ) 100 MCG/0.5ML SOSY injection Inject 0.5 mLs (100 mcg total) into the skin every Friday at 6 PM. 08/22/24   de Clint Kill, Cortney E, NP  divalproex  (DEPAKOTE  ER) 500 MG 24 hr tablet Take 1 tablet (500 mg total) by mouth daily. 09/24/24   Whitfield Raisin, NP  doxercalciferol  (HECTOROL ) 4 MCG/2ML injection Inject 3 mLs (6 mcg total) into the vein Every Tuesday,Thursday,and Saturday with dialysis. 08/20/24   de Clint Kill, Cortney E, NP  HYDROcodone -acetaminophen  (NORCO/VICODIN) 5-325 MG tablet Take 1 tablet by mouth every 6 (six) hours as needed for moderate pain (pain score 4-6) or severe pain (pain score 7-10).    [provider]  levothyroxine  (SYNTHROID ) 75 MCG tablet Take 75 mcg by mouth daily before breakfast. 06/30/22   [provider]  melatonin 3 MG TABS tablet Take 1 tablet (3 mg total) by mouth at bedtime. 08/20/24   de Clint Kill, Cortney E, NP  polyethylene glycol (MIRALAX  / GLYCOLAX ) 17 g packet Take 17 g by mouth daily as needed for severe constipation. 08/20/24   de Clint Kill, Cortney E, NP  senna-docusate  (SENOKOT-S) 8.6-50 MG tablet Take 1 tablet by mouth at bedtime as needed for mild constipation or moderate constipation. 08/20/24   de Clint Kill, Cortney E, NP  sevelamer  carbonate (RENVELA ) 800 MG tablet Take 2 tablets (1,600 mg total) by mouth 3 (three) times daily with meals. 08/20/24   de Clint Kill Earle FORBES, NP    Physical Exam: Vitals:   09/29/24 1114 09/29/24 1300 09/29/24 1330 09/29/24 1519  BP: 120/61 114/80  138/79  Pulse: 75  79 91  Resp: 16 12 11 13   Temp: 98.5 F (36.9 C)     TempSrc: Oral     SpO2: 98%  100% 100%    Physical Exam Constitutional:      General: Norma Coleman is not in acute distress.    Appearance: Normal appearance.  HENT:     Head: Normocephalic and atraumatic.     Mouth/Throat:  Mouth: Mucous membranes are moist.     Pharynx: Oropharynx is clear.  Eyes:     Extraocular Movements: Extraocular movements intact.     Pupils: Pupils are equal, round, and reactive to light.  Cardiovascular:     Rate and Rhythm: Normal rate and regular rhythm.     Pulses: Normal pulses.     Heart sounds: Normal heart sounds.  Pulmonary:     Effort: Pulmonary effort is normal. No respiratory distress.     Breath sounds: Normal breath sounds.  Abdominal:     General: Bowel sounds are normal. There is no distension.     Palpations: Abdomen is soft.     Tenderness: There is no abdominal tenderness.  Musculoskeletal:        General: No swelling or deformity.  Skin:    General: Skin is warm and dry.  Neurological:     General: No focal deficit present.     Comments: Alert oriented to person    Labs on Admission: I have personally reviewed following labs and imaging studies  CBC: Recent Labs  Lab 09/23/24 1002 09/29/24 1122  WBC 3.2* 2.3*  NEUTROABS 1.9  --   HGB 13.3 12.8  HCT 41.5 40.2  MCV 106* 106.9*  PLT 51* 54*    Basic Metabolic Panel: Recent Labs  Lab 09/23/24 1002 09/29/24 1319  NA 145* 145  K 4.1 3.9  CL 99 110  CO2 25 21*  GLUCOSE 84 66*   BUN 18 51*  CREATININE 4.86* 6.49*  CALCIUM  8.8 6.3*  MG  --  1.8    GFR: Estimated Creatinine Clearance: 4.6 mL/min (A) (by C-G formula based on SCr of 6.49 mg/dL (H)).  Liver Function Tests: Recent Labs  Lab 09/23/24 1002 09/29/24 1319  AST 13 22  ALT 6 <5  ALKPHOS 295* 200*  BILITOT 0.3 <0.2  PROT 6.2 4.3*  ALBUMIN  3.6* 2.4*    Urine analysis:    Component Value Date/Time   COLORURINE AMBER (A) 09/20/2024 1550   APPEARANCEUR TURBID (A) 09/20/2024 1550   LABSPEC 1.011 09/20/2024 1550   PHURINE 7.0 09/20/2024 1550   GLUCOSEU NEGATIVE 09/20/2024 1550   HGBUR LARGE (A) 09/20/2024 1550   BILIRUBINUR NEGATIVE 09/20/2024 1550   KETONESUR NEGATIVE 09/20/2024 1550   PROTEINUR >=300 (A) 09/20/2024 1550   NITRITE NEGATIVE 09/20/2024 1550   LEUKOCYTESUR LARGE (A) 09/20/2024 1550    Radiological Exams on Admission: CT Head Wo Contrast Result Date: 09/29/2024 EXAM: CT HEAD WITHOUT CONTRAST 09/29/2024 01:28:56 PM TECHNIQUE: CT of the head was performed without the administration of intravenous contrast. Automated exposure control, iterative reconstruction, and/or weight based adjustment of the mA/kV was utilized to reduce the radiation dose to as low as reasonably achievable. COMPARISON: Head CT and MRI 12/325. CLINICAL HISTORY: Mental status change, unknown cause. FINDINGS: BRAIN AND VENTRICLES: There is no evidence of an acute infarct, intracranial hemorrhage, mass, midline shift, hydrocephalus, or extra-axial fluid collection. The right MCA infarct on last month's studies has evolved, and there is now a small region of developing encephalomalacia in the right insula and frontoparietal operculum. Confluent hypodensities in the cerebral white matter bilaterally are nonspecific but compatible with severe chronic small vessel ischemic disease. A 1 cm calcified mass projecting from the undersurface of the right tentorial leaflet into the cerebellopontine angle cistern is unchanged and  compatible with a meningioma. There is mild cerebral atrophy. Calcified atherosclerosis at the skull base. ORBITS: Bilateral cataract extraction. SINUSES: No acute abnormality. SOFT TISSUES AND  SKULL: No acute soft tissue abnormality. No skull fracture. IMPRESSION: 1. No acute intracranial abnormality. 2. Expected interval evolution of a small right MCA infarct. 3. Severe chronic small vessel ischemic disease. Electronically signed by: Dasie Hamburg MD 09/29/2024 01:37 PM EST RP Workstation: [id number]   DG Chest 2 View Result Date: 09/29/2024 CLINICAL DATA:  Shortness of breath with possible altered mental status. EXAM: DG CHEST 2V COMPARISON:  August 12, 2024 FINDINGS: The study is limited secondary to patient positioning. The cardiac silhouette is mildly enlarged and unchanged in size. There is marked severity calcification of the aortic arch. No acute infiltrate, pleural effusion or pneumothorax is identified. Multilevel degenerative changes are seen throughout the thoracic spine. IMPRESSION: 1. Limited study secondary to patient positioning, without acute cardiopulmonary disease. 2. Stable cardiomegaly. Electronically Signed   By: Suzen Dials M.D.   On: 09/29/2024 13:35   EKG: Independently reviewed.  Atrial fibrillation at 70 bpm.  Nonspecific T wave changes.  Baseline artifact worst in V1.  Assessment/Plan Principal Problem:   Acute encephalopathy Active Problems:   Hypothyroidism   Essential hypertension   Hyperlipidemia   History of DVT (deep vein thrombosis)   DM2 (diabetes mellitus, type 2) (HCC)   Chronic diastolic CHF (congestive heart failure) (HCC)   ESRD (end stage renal disease) (HCC)   History of CVA (cerebrovascular accident)   New onset atrial fibrillation (HCC)   Acute encephalopathy > Patient with about a week of worsening weakness, confusion, lethargy.  Has gone to the point where Norma Coleman can no longer walk with a walker. > Unable to attend dialysis due to her weakness  today.  Also missed session last week due to diarrhea as below. > Workup in the ED notable for calcium  that corrects to 7.6 otherwise electrolytes largely stable.  Has some persistent leukopenia and thrombocytopenia in the setting of newly started valproic  acid, which neurology recently adjusted the dose of.  Current valproic  acid level normal. > Chest x-ray showed no acute abnormality.  CT head showed no acute abnormality.  Urinalysis is pending (patient had UTI 1/10 and completed antibiotics for this with improvement in dysuria). > Unclear etiology at this point.  Will follow-up urinalysis to evaluate for possible continued infection.  Will also follow-up respiratory viral panel for possible viral etiology.  And check TSH.  If no explanation found this could be secondary to valproic  acid primarily versus valproic  acid hyperammonemia (will check ammonia level as well) - Monitor on telemetry - Check ammonia, TSH - Follow-up urinalysis - Follow-up respiratory viral panel - Monitor response to dialysis as below - Supportive care, consideration of antiepileptic adjustments  New onset atrial fibrillation > Patient has been ordered a 30-day monitor but this had not been applied yet by family because they just recently got it upon discharge from facility. > Found to be in A-fib in the ED, rate controlled. - Rate currently controlled, hold off on nodal blocking agent - Start Eliquis  at 2.5 mg twice daily - Will need to be ongoing discussions about risk-benefit of anticoagulation, however graduated from rehab recently, no significant history of falls, recent stroke likely embolic from this new A-fib  ESRD on HD > Missed HD Friday due to diarrhea and missed HD today due to weakness. > Calcium  6.3 but corrected 7.6 considering albumin  2.4.  Otherwise electrolytes stable.  Mildly volume overloaded with proBNP elevated to 5252. > Nephrology consulted in the ED for dialysis. - Appreciate nephrology  recommendations and assistance - Continue with dialysis - Trend  renal function and electrolytes  History of CVA > Recent admission by neurology service last month for MCA CVA status post mechanical thrombectomy.  Discharged on dual antiplatelet therapy with 30-day heart monitor (30-day monitor not yet started, however patient found to be in A-fib in the ED).  Complicated by onset of seizures. > CT head stable - Continue aspirin  - Completed Plavix  - Starting on Eliquis  as above  Seizures > Recently had valproic  acid dose adjusted due to thrombocytopenia and leukopenia.  Leukopenia and thrombocytopenia are persistent we will continue to monitor these as dose change was recent. > If ammonia comes back elevated this may be secondary to the valproic  acid as well and would likely need to switch to a different antiepileptic. - Monitor results of workup and adjust as indicated, may need neurology consult pending results - Continue valproic  acid at reduced dose.  Thrombocytopenia, leukopenia > White count 2.3, platelets 54.  Similar to recent lab workup outpatient. > Dose adjusted as above. - Continue to trend  Hypertension - Not currently on antiantihypertensives  Hyperlipidemia - Continue home statin  Diabetes > Glucose in the 60s in the ED. - Will recheck to ensure this is coming up - SSI  Hypothyroidism > As per HPI, daughter suspects patient is missing some of these doses as it is the 1 medication the daughter does not directly supervise. - Continue home Synthroid  - Checking TSH as above  Chronic diastolic CHF > Last echo was during stroke workup last month and showed EF 60-65%, indeterminate diastolic function, normal RV function. > Managed with HD - Continue dialysis  DVT prophylaxis: Heparin  Code Status:   DNR/DNI Family Communication:  Updated at bedside  Disposition Plan:   Patient is from:  Home  Anticipated DC to:  Home  Anticipated DC date:  1 to 3  days  Anticipated DC barriers: None  Consults called:  Neurology Admission status:  Observation, telemetry  Severity of Illness: The appropriate patient status for this patient is OBSERVATION. Observation status is judged to be reasonable and necessary in order to provide the required intensity of service to ensure the patient's safety. The patient's presenting symptoms, physical exam findings, and initial radiographic and laboratory data in the context of their medical condition is felt to place them at decreased risk for further clinical deterioration. Furthermore, it is anticipated that the patient will be medically stable for discharge from the hospital within 2 midnights of admission.    Marsa KATHEE Scurry MD [facility]  How to contact the [facility] Attending or Consulting provider 7A - 7P or covering provider during after hours 7P -7A, for this patient?   Check the care team in [facility] and look for a) attending/consulting [facility] provider listed and b) the [facility] team listed Log into [email or web address] and use [place]'s universal password to access. If you do not have the password, please contact the hospital operator. Locate the [facility] provider you are looking for under [facility] and page to a number that you can be directly reached. If you still have difficulty reaching the provider, please page the [facility] (Director on Call) for the Hospitalists listed on amion for assistance.  09/29/2024, 3:47 PM       [1]  Allergies Allergen Reactions   Ibuprofen Other (See Comments)    Stomach bleed   Sulfa Antibiotics Other (See Comments)    Unknown    "

## 2024-09-29 NOTE — ED Notes (Signed)
 Pt to xray

## 2024-09-29 NOTE — ED Notes (Signed)
 Apple juice given to pt due to low glucose

## 2024-09-29 NOTE — Consult Note (Signed)
 Renal Service Consult Note [facility] Lamar JONETTA Fret, MD  Patient: Norma Coleman Date: 09/29/2024 Requesting Physician: Dr. Seena  Reason for Consult: ESRD pt w/ AMS  HPI: The patient is a 89 y.o. 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 Dtr reported gen'd weakness and poor po intake for last week. Pt esrd on HD MWF, missed HD Friday and today. In ED BP 130/80, HR 80, RR 12, temp 98. Na 145, K+ 3.9, CO2 21, bun 51, creat 6.5, Ca 6.3, alb 2.4, lft's ok, pro BNP 5200. WBC 2K, Hb 12.8. CXR CM, no active disease. Pt was admitted. UCx sent, UA pending. NH3, TSH and resp viral panel sent. We are asked to see for dialysis.    Pt seen in ED room. Pt is elderly and looks a bit frail, no distress. Pleasant, knows the year and herself. No CP , no abd pain.    ROS - denies CP, no joint pain, no HA, no blurry vision, no rash, no diarrhea, no nausea/ vomiting   Past Medical History  Past Medical History:  Diagnosis Date   DVT (deep venous thrombosis) (HCC)    GI bleed due to NSAIDs    High cholesterol    Hypertension    Hyperthyroidism    Pneumonia    Skin cancer    Thyroid  disease    Past Surgical History  Past Surgical History:  Procedure Laterality Date   A/V SHUNT INTERVENTION Left 05/08/2024   Procedure: A/V SHUNT INTERVENTION;  Surgeon: Sheree Penne Bruckner, MD;  Location: [facility];  Service: Cardiovascular;  Laterality: Left;   IR CT HEAD LTD  08/12/2024   IR FLUORO GUIDE CV LINE RIGHT  09/18/2022   IR PERCUTANEOUS ART THROMBECTOMY/INFUSION INTRACRANIAL INC DIAG ANGIO  08/12/2024   IR US  GUIDE VASC ACCESS RIGHT  09/18/2022   IR US  GUIDE VASC ACCESS RIGHT  08/12/2024   IR US  GUIDE VASC ACCESS RIGHT  08/12/2024   KNEE SURGERY     RADIOLOGY WITH ANESTHESIA N/A 08/12/2024   Procedure: RADIOLOGY WITH ANESTHESIA;  Surgeon: Radiologist, Medication, MD;  Location: [facility];   Service: Radiology;  Laterality: N/A;   TONSILLECTOMY     TUBAL LIGATION     VENOUS ANGIOPLASTY  05/08/2024   Procedure: VENOUS ANGIOPLASTY;  Surgeon: Sheree Penne Bruckner, MD;  Location: [facility];  Service: Cardiovascular;;   Family History  Family History  Problem Relation Age of Onset   Stroke Neg Hx    Social History  reports that she has never smoked. She has never used smokeless tobacco. She reports that she does not drink alcohol  and does not use drugs. Allergies Allergies[1] Home medications Prior to Admission medications  Medication Sig Start Date End Date Taking? Authorizing Provider  acetaminophen  (TYLENOL ) 325 MG tablet Take 2 tablets (650 mg total) by mouth every 4 (four) hours as needed for mild pain (pain score 1-3) (or temp > 37.5 C). 08/20/24  Yes de Clint Kill, Cortney E, NP  ALPRAZolam  (XANAX ) 1 MG tablet Take 1 mg by mouth at bedtime as needed for anxiety.   Yes [provider]  aspirin  EC 81 MG tablet Take 1 tablet (81 mg total) by mouth daily. Swallow whole. 08/20/24  Yes de Clint Kill, Cortney E, NP  atorvastatin  (LIPITOR) 40 MG tablet Take 40 mg by mouth daily. 01/09/20  Yes [provider]  bisacodyl  (DULCOLAX) 10 MG suppository Place 1 suppository (10 mg total) rectally daily as needed for moderate constipation. 08/20/24  Yes de Clint Kill, Cortney E, NP  cephALEXin  (KEFLEX ) 500 MG capsule Take 1 capsule (500 mg total) by mouth 2 (two) times daily. 09/20/24  Yes Keith, Kayla N, PA-C  divalproex  (DEPAKOTE ) 500 MG DR tablet Take 500 mg by mouth 2 (two) times daily. 08/22/24  Yes [provider]  HYDROcodone -acetaminophen  (NORCO/VICODIN) 5-325 MG tablet Take 1 tablet by mouth every 6 (six) hours as needed for moderate pain (pain score 4-6) or severe pain (pain score 7-10).   Yes [provider]  levothyroxine  (SYNTHROID ) 75 MCG tablet Take 75 mcg by mouth daily before breakfast. 06/30/22  Yes [provider]  polyethylene  glycol (MIRALAX  / GLYCOLAX ) 17 g packet Take 17 g by mouth daily as needed for severe constipation. 08/20/24  Yes de Clint Kill, Cortney E, NP  senna-docusate (SENOKOT-S) 8.6-50 MG tablet Take 1 tablet by mouth at bedtime as needed for mild constipation or moderate constipation. 08/20/24  Yes de Clint Kill, Cortney E, NP  sevelamer  carbonate (RENVELA ) 800 MG tablet Take 2 tablets (1,600 mg total) by mouth 3 (three) times daily with meals. 08/20/24  Yes de Clint Kill, Cortney E, NP  Darbepoetin Alfa  (ARANESP ) 100 MCG/0.5ML SOSY injection Inject 0.5 mLs (100 mcg total) into the skin every Friday at 6 PM. 08/22/24   de Clint Kill, Cortney E, NP  divalproex  (DEPAKOTE  ER) 500 MG 24 hr tablet Take 1 tablet (500 mg total) by mouth daily. Patient not taking: Reported on 09/29/2024 09/24/24   Whitfield Raisin, NP  doxercalciferol  (HECTOROL ) 4 MCG/2ML injection Inject 3 mLs (6 mcg total) into the vein Every Tuesday,Thursday,and Saturday with dialysis. 08/20/24   de Clint Kill, Cortney E, NP  melatonin 3 MG TABS tablet Take 1 tablet (3 mg total) by mouth at bedtime. Patient not taking: Reported on 09/29/2024 08/20/24   de Clint Kill Feather E, NP     Vitals:   09/29/24 1300 09/29/24 1330 09/29/24 1519 09/29/24 1615  BP: 114/80  138/79 118/73  Pulse:  79 91 77  Resp: 12 11 13 10   Temp:      TempSrc:      SpO2:  100% 100% 100%   Exam Gen alert, no distress, elderly female Frail, deconditioned Sclera anicteric, throat clear  No jvd or bruits Chest clear bilat to bases RRR no MRG Abd soft ntnd no mass or ascites +bs Ext no LE or UE edema, no other edema Neuro is alert, Ox 2.5 , nf    LUA AVF+bruit   Home bp meds: none   OP HD: SW MWF 3h  B350  55kg 2K bath  AVF  Hep 3000 bolus + 2000 midrun Last OP HD 1/14   Assessment/ Plan:  # ESRD - on HD MWF, missed HD friday - pt stable, labs / vol okay - plan HD 1st shift tomorrow    # HTN - bp's stable, not on any bp lowering meds   # Volume -  euvolemic on exam   # 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, follow   # AMS - w/u per primary team         Myer Fret  MD [facility] 09/29/2024, 4:46 PM  Recent Labs  Lab 09/23/24 1002 09/29/24 1122 09/29/24 1319  HGB 13.3 12.8  --   ALBUMIN  3.6*  --  2.4*  CALCIUM  8.8  --  6.3*  CREATININE 4.86*  --  6.49*  K 4.1  --  3.9   Inpatient medications:  apixaban   2.5 mg Oral BID   [START ON 09/30/2024] aspirin  EC  81 mg Oral Daily   [START ON 09/30/2024] atorvastatin   40 mg Oral Daily   [START ON 09/30/2024] divalproex   500 mg Oral Daily   insulin  aspart  0-6 Units Subcutaneous TID WC   [START ON 09/30/2024] levothyroxine   75 mcg Oral QAC breakfast    acetaminophen  **OR** acetaminophen , melatonin, polyethylene glycol      [1]  Allergies Allergen Reactions   Ibuprofen Other (See Comments)    Stomach bleed   Sulfa Antibiotics Other (See Comments)    Unknown

## 2024-09-29 NOTE — ED Provider Notes (Signed)
 "  Emergency Department Provider Note   I have reviewed the triage vital signs and the nursing notes.   HISTORY  Chief Complaint Altered Mental Status   HPI Norma Coleman is a 89 y.o. female with PMH reviewed including ESRD presents to the ED with increased weakness, confusion, and sleepiness. She has had multiple medical issues recently including CVA in December and ED visit with UTI diagnosis. She was having UTI symptoms then and started on Keflex . Dysuria has decreased per patient. Daughter notes worsening confusion and weakness now to the point of not being able to walk with a walker which is her baseline. LEvel 5 caveat: AMS   Past Medical History:  Diagnosis Date   DVT (deep venous thrombosis) (HCC)    GI bleed due to NSAIDs    High cholesterol    Hypertension    Hyperthyroidism    Pneumonia    Skin cancer    Thyroid  disease     Review of Systems  Level 5 caveat: AMS  ____________________________________________   PHYSICAL EXAM:  VITAL SIGNS: ED Triage Vitals [09/29/24 1114]  Encounter Vitals Group     BP 120/61     Pulse Rate 75     Resp 16     Temp 98.5 F (36.9 C)     Temp Source Oral     SpO2 98 %   Constitutional: Alert with some confusion surrounding location and situation. No acute distress. Eyes: Conjunctivae are normal. PERRL. EOMI. Head: Atraumatic. Nose: No congestion/rhinnorhea. Mouth/Throat: Mucous membranes are moist.   Neck: No stridor.   Cardiovascular: Normal rate, regular rhythm. Good peripheral circulation. Grossly normal heart sounds.   Respiratory: Normal respiratory effort.  No retractions. Lungs CTAB. Gastrointestinal: Soft and nontender. No distention.  Musculoskeletal: No lower extremity tenderness nor edema. No gross deformities of extremities. Neurologic:  Normal speech and language. No gross focal neurologic deficits are appreciated.  Skin:  Skin is warm, dry and intact. No rash  noted.   ____________________________________________   LABS (all labs ordered are listed, but only abnormal results are displayed)  Labs Reviewed  CBC - Abnormal; Notable for the following components:      Result Value   WBC 2.3 (*)    RBC 3.76 (*)    MCV 106.9 (*)    RDW 15.9 (*)    Platelets 54 (*)    All other components within normal limits  PRO BRAIN NATRIURETIC PEPTIDE - Abnormal; Notable for the following components:   Pro Brain Natriuretic Peptide 5,252.0 (*)    All other components within normal limits  COMPREHENSIVE METABOLIC PANEL WITH GFR - Abnormal; Notable for the following components:   CO2 21 (*)    Glucose, Bld 66 (*)    BUN 51 (*)    Creatinine, Ser 6.49 (*)    Calcium  6.3 (*)    Total Protein 4.3 (*)    Albumin  2.4 (*)    Alkaline Phosphatase 200 (*)    GFR, Estimated 6 (*)    All other components within normal limits  TSH - Abnormal; Notable for the following components:   TSH 90.000 (*)    All other components within normal limits  COMPREHENSIVE METABOLIC PANEL WITH GFR - Abnormal; Notable for the following components:   BUN 70 (*)    Creatinine, Ser 9.06 (*)    Total Protein 5.6 (*)    Albumin  3.1 (*)    Alkaline Phosphatase 259 (*)    GFR, Estimated 4 (*)  Anion gap 16 (*)    All other components within normal limits  CBC - Abnormal; Notable for the following components:   WBC 3.3 (*)    RBC 3.56 (*)    MCV 104.5 (*)    RDW 15.8 (*)    Platelets 51 (*)    All other components within normal limits  CBG MONITORING, ED - Abnormal; Notable for the following components:   Glucose-Capillary 64 (*)    All other components within normal limits  RESP PANEL BY RT-PCR (RSV, FLU A&B, COVID)  RVPGX2  MAGNESIUM   VALPROIC  ACID LEVEL  AMMONIA  HEPATITIS B SURFACE ANTIGEN  GLUCOSE, CAPILLARY  URINALYSIS, ROUTINE W REFLEX MICROSCOPIC  HEPATITIS B SURFACE ANTIBODY, QUANTITATIVE  CBG MONITORING, ED  CBG MONITORING, ED    ____________________________________________  EKG   EKG Interpretation Date/Time:  Monday September 29 2024 11:23:00 EST Ventricular Rate:  78 PR Interval:    QRS Duration:  90 QT Interval:  386 QTC Calculation: 440 R Axis:   20  Text Interpretation: Atrial fibrillation Abnormal ECG When compared with ECG of 12-Sep-2022 15:17, PREVIOUS ECG IS PRESENT Confirmed by Darra Chew [phone]) on 09/30/2024 7:43:19 AM        ____________________________________________  RADIOLOGY  CT Head Wo Contrast Result Date: 09/29/2024 EXAM: CT HEAD WITHOUT CONTRAST 09/29/2024 01:28:56 PM TECHNIQUE: CT of the head was performed without the administration of intravenous contrast. Automated exposure control, iterative reconstruction, and/or weight based adjustment of the mA/kV was utilized to reduce the radiation dose to as low as reasonably achievable. COMPARISON: Head CT and MRI 12/325. CLINICAL HISTORY: Mental status change, unknown cause. FINDINGS: BRAIN AND VENTRICLES: There is no evidence of an acute infarct, intracranial hemorrhage, mass, midline shift, hydrocephalus, or extra-axial fluid collection. The right MCA infarct on last month's studies has evolved, and there is now a small region of developing encephalomalacia in the right insula and frontoparietal operculum. Confluent hypodensities in the cerebral white matter bilaterally are nonspecific but compatible with severe chronic small vessel ischemic disease. A 1 cm calcified mass projecting from the undersurface of the right tentorial leaflet into the cerebellopontine angle cistern is unchanged and compatible with a meningioma. There is mild cerebral atrophy. Calcified atherosclerosis at the skull base. ORBITS: Bilateral cataract extraction. SINUSES: No acute abnormality. SOFT TISSUES AND SKULL: No acute soft tissue abnormality. No skull fracture. IMPRESSION: 1. No acute intracranial abnormality. 2. Expected interval evolution of a small right MCA  infarct. 3. Severe chronic small vessel ischemic disease. Electronically signed by: Dasie Hamburg MD 09/29/2024 01:37 PM EST RP Workstation: [id number]   DG Chest 2 View Result Date: 09/29/2024 CLINICAL DATA:  Shortness of breath with possible altered mental status. EXAM: DG CHEST 2V COMPARISON:  August 12, 2024 FINDINGS: The study is limited secondary to patient positioning. The cardiac silhouette is mildly enlarged and unchanged in size. There is marked severity calcification of the aortic arch. No acute infiltrate, pleural effusion or pneumothorax is identified. Multilevel degenerative changes are seen throughout the thoracic spine. IMPRESSION: 1. Limited study secondary to patient positioning, without acute cardiopulmonary disease. 2. Stable cardiomegaly. Electronically Signed   By: Suzen Dials M.D.   On: 09/29/2024 13:35    ____________________________________________   PROCEDURES  Procedure(s) performed:   Procedures  CRITICAL CARE Performed by: Chew KANDICE Darra Total critical care time: 35 minutes Critical care time was exclusive of separately billable procedures and treating other patients. Critical care was necessary to treat or prevent imminent or life-threatening deterioration. Critical care was  time spent personally by me on the following activities: development of treatment plan with patient and/or surrogate as well as nursing, discussions with consultants, evaluation of patient's response to treatment, examination of patient, obtaining history from patient or surrogate, ordering and performing treatments and interventions, ordering and review of laboratory studies, ordering and review of radiographic studies, pulse oximetry and re-evaluation of patient's condition.  Fonda Law, MD Emergency Medicine  ____________________________________________   INITIAL IMPRESSION / ASSESSMENT AND PLAN / ED COURSE  Pertinent labs & imaging results that were available during my care of  the patient were reviewed by me and considered in my medical decision making (see chart for details).   This patient is Presenting for Evaluation of AMS, which does require a range of treatment options, and is a complaint that involves a high risk of morbidity and mortality.  The Differential Diagnoses includes but is not exclusive to alcohol , illicit or prescription medications, intracranial pathology such as stroke, intracerebral hemorrhage, fever or infectious causes including sepsis, hypoxemia, uremia, trauma, endocrine related disorders such as diabetes, hypoglycemia, thyroid -related diseases, etc.   Critical Interventions-    Medications  aspirin  EC tablet 81 mg (81 mg Oral Not Given 09/29/24 1811)  atorvastatin  (LIPITOR) tablet 40 mg (40 mg Oral Not Given 09/29/24 1812)  levothyroxine  (SYNTHROID ) tablet 75 mcg (75 mcg Oral Given 09/30/24 0656)  melatonin tablet 3 mg (has no administration in time range)  divalproex  (DEPAKOTE  ER) 24 hr tablet 500 mg (has no administration in time range)  acetaminophen  (TYLENOL ) tablet 650 mg (has no administration in time range)    Or  acetaminophen  (TYLENOL ) suppository 650 mg (has no administration in time range)  polyethylene glycol (MIRALAX  / GLYCOLAX ) packet 17 g (has no administration in time range)  apixaban  (ELIQUIS ) tablet 2.5 mg (2.5 mg Oral Given 09/29/24 2144)  insulin  aspart (novoLOG ) injection 0-6 Units ( Subcutaneous Not Given 09/29/24 1812)  Chlorhexidine  Gluconate Cloth 2 % PADS 6 each (6 each Topical Given 09/30/24 0656)  heparin  injection 3,000 Units (has no administration in time range)  heparin  injection 1,500 Units (has no administration in time range)  pentafluoroprop-tetrafluoroeth (GEBAUERS) aerosol 1 Application (has no administration in time range)  lidocaine  (PF) (XYLOCAINE ) 1 % injection 5 mL (has no administration in time range)  lidocaine -prilocaine  (EMLA ) cream 1 Application (has no administration in time range)  heparin   injection 1,000 Units (has no administration in time range)  anticoagulant sodium citrate  solution 5 mL (has no administration in time range)  alteplase  (CATHFLO ACTIVASE ) injection 2 mg (has no administration in time range)  heparin  injection 2,000 Units (has no administration in time range)    Reassessment after intervention: mental status unchanged in the ED.    I did obtain Additional Historical Information from daughter at bedside, as the patient is confused.  I decided to review pertinent External Data, and in summary urine culture from 1/10 grew E Faecalis. No resistance. .   Clinical Laboratory Tests Ordered, included CBC without anemia. Calcium  6.3. Normal K. COVID negative. BNP > 5000.   Radiologic Tests Ordered, included CT head and CXR. I independently interpreted the images and agree with radiology interpretation.   Cardiac Monitor Tracing which shows A fib.    Social Determinants of Health Risk patient is a non-smoker.   Consult complete with Dr. Geralynn. Plan for HD while inpatient.   TRH. Plan for admit.    Medical Decision Making: Summary:  Patient presents with weakness, AMS, and SOB. Missed 2 HD sessions this  week which is likely contributing. Finishing abx for UTI with resolution of urinary symptoms at this time. Plan for AMS workup in the ED.   Reevaluation with update and discussion with patient. Labs discussed with patient. Will admit for further mgmt of AMS and to coordinate HD.   Patient's presentation is most consistent with acute presentation with potential threat to life or bodily function.   Disposition: admit  ____________________________________________  FINAL CLINICAL IMPRESSION(S) / ED DIAGNOSES  Final diagnoses:  Metabolic encephalopathy    Note:  This document was prepared using Dragon voice recognition software and may include unintentional dictation errors.  Fonda Law, MD, [facility] Emergency Medicine    Leon Montoya, Fonda MATSU, MD 09/30/24  [phone]  "

## 2024-09-30 ENCOUNTER — Telehealth (HOSPITAL_COMMUNITY): Payer: Self-pay

## 2024-09-30 ENCOUNTER — Other Ambulatory Visit (HOSPITAL_COMMUNITY): Payer: Self-pay

## 2024-09-30 ENCOUNTER — Encounter (HOSPITAL_COMMUNITY): Payer: Self-pay

## 2024-09-30 DIAGNOSIS — T381X6A Underdosing of thyroid hormones and substitutes, initial encounter: Secondary | ICD-10-CM | POA: Diagnosis present

## 2024-09-30 DIAGNOSIS — E7800 Pure hypercholesterolemia, unspecified: Secondary | ICD-10-CM | POA: Diagnosis present

## 2024-09-30 DIAGNOSIS — G40909 Epilepsy, unspecified, not intractable, without status epilepticus: Secondary | ICD-10-CM | POA: Diagnosis present

## 2024-09-30 DIAGNOSIS — Z515 Encounter for palliative care: Secondary | ICD-10-CM | POA: Diagnosis not present

## 2024-09-30 DIAGNOSIS — Z66 Do not resuscitate: Secondary | ICD-10-CM | POA: Diagnosis present

## 2024-09-30 DIAGNOSIS — E039 Hypothyroidism, unspecified: Secondary | ICD-10-CM | POA: Diagnosis present

## 2024-09-30 DIAGNOSIS — N186 End stage renal disease: Secondary | ICD-10-CM | POA: Diagnosis present

## 2024-09-30 DIAGNOSIS — I132 Hypertensive heart and chronic kidney disease with heart failure and with stage 5 chronic kidney disease, or end stage renal disease: Secondary | ICD-10-CM | POA: Diagnosis present

## 2024-09-30 DIAGNOSIS — I5032 Chronic diastolic (congestive) heart failure: Secondary | ICD-10-CM | POA: Diagnosis present

## 2024-09-30 DIAGNOSIS — G9340 Encephalopathy, unspecified: Secondary | ICD-10-CM | POA: Diagnosis present

## 2024-09-30 DIAGNOSIS — D631 Anemia in chronic kidney disease: Secondary | ICD-10-CM | POA: Diagnosis present

## 2024-09-30 DIAGNOSIS — I69319 Unspecified symptoms and signs involving cognitive functions following cerebral infarction: Secondary | ICD-10-CM | POA: Diagnosis not present

## 2024-09-30 DIAGNOSIS — Z79890 Hormone replacement therapy (postmenopausal): Secondary | ICD-10-CM | POA: Diagnosis not present

## 2024-09-30 DIAGNOSIS — R54 Age-related physical debility: Secondary | ICD-10-CM | POA: Diagnosis present

## 2024-09-30 DIAGNOSIS — G928 Other toxic encephalopathy: Secondary | ICD-10-CM | POA: Diagnosis present

## 2024-09-30 DIAGNOSIS — G9341 Metabolic encephalopathy: Secondary | ICD-10-CM | POA: Diagnosis present

## 2024-09-30 DIAGNOSIS — R627 Adult failure to thrive: Secondary | ICD-10-CM | POA: Diagnosis present

## 2024-09-30 DIAGNOSIS — E1122 Type 2 diabetes mellitus with diabetic chronic kidney disease: Secondary | ICD-10-CM | POA: Diagnosis present

## 2024-09-30 DIAGNOSIS — Z7982 Long term (current) use of aspirin: Secondary | ICD-10-CM | POA: Diagnosis not present

## 2024-09-30 DIAGNOSIS — D696 Thrombocytopenia, unspecified: Secondary | ICD-10-CM | POA: Diagnosis present

## 2024-09-30 DIAGNOSIS — I953 Hypotension of hemodialysis: Secondary | ICD-10-CM | POA: Diagnosis not present

## 2024-09-30 DIAGNOSIS — D72819 Decreased white blood cell count, unspecified: Secondary | ICD-10-CM | POA: Diagnosis present

## 2024-09-30 DIAGNOSIS — I4891 Unspecified atrial fibrillation: Secondary | ICD-10-CM | POA: Diagnosis present

## 2024-09-30 DIAGNOSIS — Z992 Dependence on renal dialysis: Secondary | ICD-10-CM | POA: Diagnosis not present

## 2024-09-30 DIAGNOSIS — Z1152 Encounter for screening for COVID-19: Secondary | ICD-10-CM | POA: Diagnosis not present

## 2024-09-30 DIAGNOSIS — E11649 Type 2 diabetes mellitus with hypoglycemia without coma: Secondary | ICD-10-CM | POA: Diagnosis present

## 2024-09-30 LAB — GLUCOSE, CAPILLARY
Glucose-Capillary: 88 mg/dL (ref 70–99)
Glucose-Capillary: 98 mg/dL (ref 70–99)
Glucose-Capillary: 75 mg/dL (ref 70–99)
Glucose-Capillary: 98 mg/dL (ref 70–99)
Glucose-Capillary: 101 mg/dL — ABNORMAL HIGH (ref 70–99)
Glucose-Capillary: 128 mg/dL — ABNORMAL HIGH (ref 70–99)

## 2024-09-30 LAB — CBC
HCT: 37.2 % (ref 36.0–46.0)
Hemoglobin: 12 g/dL (ref 12.0–15.0)
MCH: 33.7 pg (ref 26.0–34.0)
MCHC: 32.3 g/dL (ref 30.0–36.0)
MCV: 104.5 fL — ABNORMAL HIGH (ref 80.0–100.0)
Platelets: 51 K/uL — ABNORMAL LOW (ref 150–400)
RBC: 3.56 MIL/uL — ABNORMAL LOW (ref 3.87–5.11)
RDW: 15.8 % — ABNORMAL HIGH (ref 11.5–15.5)
WBC: 3.3 K/uL — ABNORMAL LOW (ref 4.0–10.5)
nRBC: 0 % (ref 0.0–0.2)

## 2024-09-30 LAB — COMPREHENSIVE METABOLIC PANEL WITH GFR
ALT: 7 U/L (ref 0–44)
AST: 22 U/L (ref 15–41)
Albumin: 3.1 g/dL — ABNORMAL LOW (ref 3.5–5.0)
Alkaline Phosphatase: 259 U/L — ABNORMAL HIGH (ref 38–126)
Anion gap: 16 — ABNORMAL HIGH (ref 5–15)
BUN: 70 mg/dL — ABNORMAL HIGH (ref 8–23)
CO2: 27 mmol/L (ref 22–32)
Calcium: 9 mg/dL (ref 8.9–10.3)
Chloride: 98 mmol/L (ref 98–111)
Creatinine, Ser: 9.06 mg/dL — ABNORMAL HIGH (ref 0.44–1.00)
GFR, Estimated: 4 mL/min — ABNORMAL LOW
Glucose, Bld: 83 mg/dL (ref 70–99)
Potassium: 4.9 mmol/L (ref 3.5–5.1)
Sodium: 140 mmol/L (ref 135–145)
Total Bilirubin: <0.2 mg/dL (ref 0.0–1.2)
Total Protein: 5.6 g/dL — ABNORMAL LOW (ref 6.5–8.1)

## 2024-09-30 MED ORDER — ANTICOAGULANT SODIUM CITRATE 4% (200MG/5ML) IV SOLN: 5.0000 mL | Status: DC | PRN

## 2024-09-30 MED ORDER — ALTEPLASE 2 MG IJ SOLR: 2.0000 mg | Freq: Once | INTRAMUSCULAR | Status: DC | PRN

## 2024-09-30 MED ORDER — PENTAFLUOROPROP-TETRAFLUOROETH EX AERO: 1.0000 | INHALATION_SPRAY | CUTANEOUS | Status: DC | PRN

## 2024-09-30 MED ORDER — LEVOTHYROXINE SODIUM 75 MCG PO TABS
75.0000 ug | ORAL_TABLET | Freq: Every day | ORAL | Status: DC
  Administered 2024-10-01: 75 ug via ORAL
  Filled 2024-09-30: qty 1

## 2024-09-30 MED ORDER — HEPARIN SODIUM (PORCINE) 1000 UNIT/ML DIALYSIS: 1000.0000 [IU] | INTRAMUSCULAR | Status: DC | PRN

## 2024-09-30 MED ORDER — LIDOCAINE-PRILOCAINE 2.5-2.5 % EX CREA: 1.0000 | TOPICAL_CREAM | CUTANEOUS | Status: DC | PRN

## 2024-09-30 MED ORDER — LIDOCAINE HCL (PF) 1 % IJ SOLN: 5.0000 mL | INTRAMUSCULAR | Status: DC | PRN

## 2024-09-30 MED ORDER — HEPARIN SODIUM (PORCINE) 1000 UNIT/ML IJ SOLN
INTRAMUSCULAR | Status: AC
  Filled 2024-09-30: qty 4

## 2024-09-30 MED ORDER — HEPARIN SODIUM (PORCINE) 1000 UNIT/ML DIALYSIS: 2000.0000 [IU] | INTRAMUSCULAR | Status: DC | PRN

## 2024-09-30 MED ORDER — LEVOTHYROXINE SODIUM 100 MCG PO TABS: 100.0000 ug | ORAL_TABLET | Freq: Every day | ORAL | Status: DC

## 2024-09-30 MED ORDER — HEPARIN SODIUM (PORCINE) 1000 UNIT/ML DIALYSIS
1500.0000 [IU] | INTRAMUSCULAR | Status: AC | PRN
  Administered 2024-09-30: 1500 [IU] via INTRAVENOUS_CENTRAL

## 2024-09-30 MED ORDER — HEPARIN SODIUM (PORCINE) 1000 UNIT/ML DIALYSIS
3000.0000 [IU] | Freq: Once | INTRAMUSCULAR | Status: AC
  Administered 2024-09-30: 3000 [IU] via INTRAVENOUS_CENTRAL

## 2024-09-30 NOTE — Progress Notes (Signed)
 " PROGRESS NOTE Norma Coleman  FMW:[id number] DOB: 1932/09/23 DOA: 09/29/2024 PCP: Corlis Pagan, NP  Brief Narrative/Hospital Course: Norma Coleman is a 89 y.o. female with PMH of hypertension, hyperlipidemia, diabetes, hypothyroidism, CVA, chronic diastolic CHF, ESRD on HD, DVT presenting with altered mental status\along with worsening weakness and confusion /lethargy/sleepiness  Patient was admitted to the neurology service 12/2-12/10 after acute CVA status post mechanical thrombectomy.  Complicated by new onset seizures for which patient was started on valproic  acid.  Also had some issues with anemia for which she received 2 units of PRBC transfusion.  Was discharged to rehab on dual antiplatelet therapy with plan for 30-day heart monitor. Patient had been doing okay other than treatment for a UTI for which she was started on Keflex  in the ED on 1/10.  Followed up with neurology for 5 days ago and lab workup showed low platelets and low white count so valproic  acid dose was adjusted and plan for repeat labs in a week and a half from now. (Family has not gotten the message for dose adjustment and have been continuing at the original dose.)   Patient has developed worsening weakness, confusion, lethargy for the past week or so missed dialysis on Friday due to diarrhea and again 1/19 due to generalized weakness.  She can no longer walk with a walker.  She also has missed some doses of thyroid  medication In ED: Vital signs stable. bicarb 21, BUN 51, creatinine 6.49, glucose 66, calcium  6.3 which corrects to 7.6 considering albumin  of 2.4, protein 4.3, alk phos stable at 200.  CBC with platelets stable  w/ thrombocytopenia and leukopenia, proBNP elevated to 5252. RVP panel for flu COVID and RSV pending.  Urinalysis ordered.  CXR>.mild cardiomegaly and no acute abnormality. CT head showed no acute abnormality and expected evolution of prior infarct.  Subjective: Seen and examined today in  dialysis She was alert awake able to tell me her name and that she is getting dialysis able to admit her current president Again seen after dialysis as patient was not responding and not answering questions but she was able to answer to me when I saw her again. Denies any complaint but appears forgetful and mildly confused and very weak Overnight  afebrile, VSS, Labs-shows BUN/Creat up at 70/9.0, CBC with leukopenia up 2.3> 3.3 similar thrombocytopenia, mild hypoglycemia. TSH 90  She had vasovagal episode in the dialysis that improved with improving blood pressure  Assessment and plan:  ESRD on HD MWF: Missed HD, nephrology following closely input appreciated monitor labs and volume.  Continue HD today.  Vasovagal episode during dialysis: Patient had episode of hypotension that improved with IV fluid bolus and based on T position  caution with fluid removal.  Continue supportive care PRN to monitor vitals.  Not on antihypertension.  Check a.m. cortisol level,  Acute encephalopathy Likely toxic metabolic. Worsening ever since her stroke, also having weakness no longer able to walk with a walker.  Missed dialysis likely contributing also with severe hypothyroidism-will increase thyroid  dosing current valproic  acid level normal.  Wondering if valproic  acid is contributing. Valproate changed from bid to daily. no obvious infection noticed, CT head no acute finding.  She was able to follow some commands and move extremities, although intermittently very confused not following commands. Ammonia level normal. Follow-up urinalysis but she is on HD Cont supportive care, she is on Depakote  500 mg daily-will discuss w/ neurology   New onset atrial fibrillation: Patient has been ordered a 30-day  monitor but this had not been applied yet by family because they just recently got it upon discharge from facility. In ED was in A-fib, rate controlled, hold off on nodal blocking agent now started Eliquis  at 2.5 mg  twice daily,   Severe hypothyroidism: Per daughter patient was likely not taking for at least 2 weeks.  She manages most of the medication except for Synthroid  which patient is supposed to take during night or early morning when she wakes up. TSH elevated in 90s, Synthroid  resume at 75 mcg continue NT check in 3 to 4 weeks   History of CVA Recent admission by neurology service last month for MCA CVA status post mechanical thrombectomy.  Discharged on dual antiplatelet therapy with 30-day heart monitor (30-day monitor not yet started, however patient found to be in A-fib in the ED).  Complicated by onset of seizures.CT head stable Continue aspirin  Completed Plavix  and now started on Eliquis  as above.   Seizure Disorder: Recently had valproic  acid dose adjusted due to thrombocytopenia and leukopenia.  Leukopenia and thrombocytopenia are persistent we will continue to monitor these as dose change was recent.  TSH elevated ammonia normal, will discuss with neurology to see if valproic  dose needs to be adjusted.  Thrombocytopenia, leukopenia Continue to monitor. Recent Labs  Lab 09/29/24 1122 09/30/24 0142  HGB 12.8 12.0  HCT 40.2 37.2  WBC 2.3* 3.3*  PLT 54* 51*    Hyperlipidemia Continue home statin   Diabetes Cont  SSI  Recent Labs  Lab 09/29/24 1916 09/30/24 0756 09/30/24 0933 09/30/24 1303 09/30/24 1404  GLUCAP 78 88 98 75 98    Hypertension: BP stable.  Not on meds.  Anemia of ESRD Stable:  Hyperlipidemia Continue home statin  Goals of care: DNR limited but patient appears malnourished and frail.  Overall prognosis not bad.  Perative care consulted.    Mobility: PT Orders: Active PT Follow up Rec: Skilled Nursing-Short Term Rehab (<3 Hours/Day)09/30/2024 1406   DVT prophylaxis: apixaban  (ELIQUIS ) tablet 2.5 mg Start: 09/29/24 2200 Code Status:   Code Status: Limited: Do not attempt resuscitation (DNR) -DNR-LIMITED -Do Not Intubate/DNI  Family Communication: plan  of care discussed with patient/ daughter at bedside. Patient status is: Remains hospitalized because of severity of illness Level of care: Telemetry   Dispo: The patient is from: home            Anticipated disposition: TBD Objective: Vitals last 24 hrs: Vitals:   09/30/24 1200 09/30/24 1219 09/30/24 1222 09/30/24 1305  BP: 121/73 133/71 131/64 116/66  Pulse: 86 76 78 92  Resp: 15 15 (!) 21 16  Temp:   98.1 F (36.7 C) (!) 97.4 F (36.3 C)  TempSrc:   Oral Oral  SpO2: 100% 100% 100% 95%  Weight:   54.2 kg   Height:        Physical Examination: General exam: alert awake, oriented x 2, older than stated age HEENT:Oral mucosa moist, Ear/Nose WNL grossly Respiratory system: Bilaterally clear BS,no use of accessory muscle Cardiovascular system: S1 & S2 +, No JVD. Gastrointestinal system: Abdomen soft,NT,ND, BS+ Nervous System: Alert, awake, moving all extremities,and following commands. Extremities: extremities warm, leg edema neg Skin: Warm, no rashes MSK: Very weak muscle bulk,tone, power   Medications reviewed: Scheduled Meds:  apixaban   2.5 mg Oral BID   aspirin  EC  81 mg Oral Daily   atorvastatin   40 mg Oral Daily   Chlorhexidine  Gluconate Cloth  6 each Topical Q0600   divalproex   500 mg Oral Daily   insulin  aspart  0-6 Units Subcutaneous TID WC   [START ON 10/01/2024] levothyroxine   100 mcg Oral Q0600   Continuous Infusions: Diet: Diet Order             Diet renal with fluid restriction Fluid restriction: 1200 mL Fluid; Room service appropriate? Yes; Fluid consistency: Thin  Diet effective now                    Unresulted Labs (From admission, onward)     Start     Ordered   10/01/24 0500  Cortisol-am, blood  Tomorrow morning,   R        09/30/24 1328   09/29/24 1853  Hepatitis B surface antibody,quantitative  (New Admission Hemo Labs (Hepatitis B))  ONCE - URGENT,   URGENT        09/29/24 1853   09/29/24 1117  Urinalysis, Routine w reflex microscopic  -Urine, Catheterized  Once,   URGENT       Question Answer Comment  Obtain urine by in and out catheter if not obtained within 30 minutes of placing in a treatment room? Yes   Specimen Source Urine, Catheterized      09/29/24 1116           Data Reviewed: I have personally reviewed following labs and imaging studies ( see epic result tab) CBC: Recent Labs  Lab 09/29/24 1122 09/30/24 0142  WBC 2.3* 3.3*  HGB 12.8 12.0  HCT 40.2 37.2  MCV 106.9* 104.5*  PLT 54* 51*   CMP: Recent Labs  Lab 09/29/24 1319 09/30/24 0142  NA 145 140  K 3.9 4.9  CL 110 98  CO2 21* 27  GLUCOSE 66* 83  BUN 51* 70*  CREATININE 6.49* 9.06*  CALCIUM  6.3* 9.0  MG 1.8  --    GFR: Estimated Creatinine Clearance: 3.3 mL/min (A) (by C-G formula based on SCr of 9.06 mg/dL (H)). Recent Labs  Lab 09/29/24 1319 09/30/24 0142  AST 22 22  ALT <5 7  ALKPHOS 200* 259*  BILITOT <0.2 <0.2  PROT 4.3* 5.6*  ALBUMIN  2.4* 3.1*   No results for input(s): LIPASE, AMYLASE in the last 168 hours.  Recent Labs  Lab 09/29/24 1949  AMMONIA 31   Coagulation Profile: No results for input(s): INR, PROTIME in the last 168 hours. Antimicrobials/Microbiology: Anti-infectives (From admission, onward)    None         Component Value Date/Time   SDES URINE, RANDOM 09/20/2024 1550   SPECREQUEST  09/20/2024 1550    NONE Reflexed from [id number] Performed at [facility], [address] [place], [place] [address]    CULT 60,000 COLONIES/mL ENTEROCOCCUS FAECALIS (A) 09/20/2024 1550   REPTSTATUS 09/22/2024 FINAL 09/20/2024 1550    Procedures:    Mennie LAMY, MD [facility] 09/30/2024, 2:32 PM   "

## 2024-09-30 NOTE — Progress Notes (Signed)
" °   09/30/24 1222  Vitals  Temp 98.1 F (36.7 C)  Temp Source Oral  BP 131/64  Pulse Rate 78  Resp (!) 21  Weight 54.2 kg  Type of Weight Post-Dialysis  Oxygen Therapy  SpO2 100 %  O2 Device Nasal Cannula  Post Treatment  Dialyzer Clearance Lightly streaked  Hemodialysis Intake (mL) 0 mL  Liters Processed 73.5  Fluid Removed (mL) -400 mL  Tolerated HD Treatment Yes  AVG/AVF Arterial Site Held (minutes) 5 minutes  AVG/AVF Venous Site Held (minutes) 5 minutes  Fistula / Graft Left Upper arm  Placement Date/Time: (c) 09/14/22 (c) 1846   Orientation: Left  Access Location: Upper arm  Site Condition No complications  Fistula / Graft Assessment Present;Thrill;Bruit  Status Deaccessed  Drainage Description None   Hand off to the patient's nurse with stable vitals and baseline mentation via transport. "

## 2024-09-30 NOTE — Evaluation (Signed)
 Occupational Therapy Evaluation Patient Details Name: Norma Coleman MRN: [id number] DOB: [birth year] Today's Date: 09/30/2024   History of Present Illness   Pt is an 89 y/o female presenting with AMS in setting of missed HD sessions and recently diagnosed UTI (1/10). Recent admission 12/2-12/10 for acute CVA. PMH: ESRD on HD, HTN, DM2, CHF, DVT, CVA     Clinical Impressions PTA, pt lives with daughter, typically Modified Independent with ADLs and mobility using Rollator. Pt presents now with deficits in cognition, standing balance, strength and endurance. Pt requiring Mod A to stand and Min-Mod A for bathroom mobility using RW. Consistent cues needed to stay inside RW, as well as motor planning/step sequencing. Pt requires Min A for UB ADL and up to Max A for LB ADLs d/t deficits. Unless family able to provide the consistent physical assistance that pt current requires, recommend continued inpatient follow up therapy, <3 hours/day at DC.     If plan is discharge home, recommend the following:   A lot of help with walking and/or transfers;A lot of help with bathing/dressing/bathroom;Direct supervision/assist for medications management;Direct supervision/assist for financial management;Assistance with cooking/housework     Functional Status Assessment   Patient has had a recent decline in their functional status and demonstrates the ability to make significant improvements in function in a reasonable and predictable amount of time.     Equipment Recommendations   BSC/3in1     Recommendations for Other Services         Precautions/Restrictions   Precautions Precautions: Fall Restrictions Weight Bearing Restrictions Per Provider Order: No     Mobility Bed Mobility Overal bed mobility: Needs Assistance Bed Mobility: Sit to Supine           General bed mobility comments: EOB on entry with nursing. Mod A for BLE back to bed    Transfers Overall transfer  level: Needs assistance Equipment used: Rolling walker (2 wheels) Transfers: Sit to/from Stand Sit to Stand: Mod assist           General transfer comment: from bedside and regular toilet      Balance Overall balance assessment: Needs assistance Sitting-balance support: No upper extremity supported, Feet supported Sitting balance-Leahy Scale: Fair     Standing balance support: Bilateral upper extremity supported, During functional activity Standing balance-Leahy Scale: Poor                             ADL either performed or assessed with clinical judgement   ADL Overall ADL's : Needs assistance/impaired Eating/Feeding: Set up   Grooming: Minimal assistance;Standing   Upper Body Bathing: Minimal assistance;Sitting   Lower Body Bathing: Moderate assistance;Sitting/lateral leans;Sit to/from stand   Upper Body Dressing : Minimal assistance;Sitting   Lower Body Dressing: Maximal assistance;Sitting/lateral leans;Sit to/from stand   Toilet Transfer: Minimal assistance;Moderate assistance;Ambulation;Regular Toilet;Rolling walker (2 wheels) Toilet Transfer Details (indicate cue type and reason): consistent cues needed to stay inside RW for mobility with poor carryover. Assist for RW mgmt and cues to step inside small bathroom needed w/ tactile cues to advance LE. one instance of knee buckling but able to assist to recover. assist to guide to regular toilet and heavy Mod A to stand from regular toilet. Able to progress to Min A for mobility out of bathroom but max cues needed to keep RW close Toileting- Clothing Manipulation and Hygiene: Maximal assistance;Sitting/lateral lean;Sit to/from stand Toileting - Clothing Manipulation Details (indicate cue type and reason): assist  to assist with doffing pants around waist but assist needed to maintain balance while doing so. OT assist for hygiene and pulling pants over waist     Functional mobility during ADLs: Minimal  assistance;Moderate assistance;Rolling walker (2 wheels);Cueing for sequencing       Vision Ability to See in Adequate Light: 0 Adequate Patient Visual Report: No change from baseline Vision Assessment?: No apparent visual deficits;Wears glasses for reading     Perception         Praxis         Pertinent Vitals/Pain Pain Assessment Pain Assessment: No/denies pain     Extremity/Trunk Assessment Upper Extremity Assessment Upper Extremity Assessment: Generalized weakness;Right hand dominant   Lower Extremity Assessment Lower Extremity Assessment: Defer to PT evaluation   Cervical / Trunk Assessment Cervical / Trunk Assessment: Kyphotic   Communication Communication Communication: Impaired Factors Affecting Communication: Hearing impaired   Cognition Arousal: Alert Behavior During Therapy: WFL for tasks assessed/performed Cognition: Cognition impaired     Awareness: Online awareness impaired Memory impairment (select all impairments): Working civil service fast streamer, Short-term memory Attention impairment (select first level of impairment): Sustained attention Executive functioning impairment (select all impairments): Initiation, Organization, Sequencing, Problem solving, Reasoning OT - Cognition Comments: pleasant, very slow processing and motor planning. multimodal cues and sequencing cues needed for ADLs/mobility with inconsistent follow through noted.                 Following commands: Impaired Following commands impaired: Follows one step commands with increased time, Follows one step commands inconsistently     Cueing  General Comments   Cueing Techniques: Verbal cues;Gestural cues;Tactile cues      Exercises     Shoulder Instructions      Home Living Family/patient expects to be discharged to:: Private residence Living Arrangements: Children Available Help at Discharge: Family Type of Home: House Home Access: Stairs to enter Secretary/administrator of  Steps: 5 Entrance Stairs-Rails: Right;Left;Can reach both Home Layout: Able to live on main level with bedroom/bathroom     Bathroom Shower/Tub: Producer, Television/film/video: Standard     Home Equipment: Rollator (4 wheels);Cane - single point;BSC/3in1;Shower seat          Prior Functioning/Environment Prior Level of Function : Needs assist             Mobility Comments: using rollator for mobility at home ADLs Comments: previously managing majority of ADLs w/o assist    OT Problem List: Decreased strength;Decreased activity tolerance;Impaired balance (sitting and/or standing);Decreased cognition;Decreased knowledge of use of DME or AE   OT Treatment/Interventions: Self-care/ADL training;Therapeutic exercise;Energy conservation;DME and/or AE instruction;Therapeutic activities;Patient/family education;Balance training      OT Goals(Current goals can be found in the care plan section)   Acute Rehab OT Goals Patient Stated Goal: call my daughter (assisted during session but only able to leave voicemail) OT Goal Formulation: With patient Time For Goal Achievement: 10/14/24 Potential to Achieve Goals: Good ADL Goals Pt Will Perform Grooming: standing;with supervision Pt Will Perform Lower Body Bathing: with contact guard assist;sitting/lateral leans;sit to/from stand Pt Will Transfer to Toilet: with contact guard assist;ambulating Pt Will Perform Toileting - Clothing Manipulation and hygiene: with min assist;sitting/lateral leans;sit to/from stand   OT Frequency:  Min 2X/week    Co-evaluation              AM-PAC OT 6 Clicks Daily Activity     Outcome Measure Help from another person eating meals?: A Little Help from another person taking  care of personal grooming?: A Little Help from another person toileting, which includes using toliet, bedpan, or urinal?: A Lot Help from another person bathing (including washing, rinsing, drying)?: A Lot Help from  another person to put on and taking off regular upper body clothing?: A Little Help from another person to put on and taking off regular lower body clothing?: A Lot 6 Click Score: 15   End of Session Equipment Utilized During Treatment: Rolling walker (2 wheels) Nurse Communication: Mobility status  Activity Tolerance: Patient tolerated treatment well Patient left: in bed;with call bell/phone within reach;with bed alarm set  OT Visit Diagnosis: Unsteadiness on feet (R26.81);Other abnormalities of gait and mobility (R26.89);Other symptoms and signs involving cognitive function                Time: 0727-0752 OT Time Calculation (min): 25 min Charges:  OT General Charges $OT Visit: 1 Visit OT Evaluation $OT Eval Moderate Complexity: 1 Mod OT Treatments $Self Care/Home Management : 8-22 mins  Mliss NOVAK, OTR/L Acute Rehab Services Office: [phone]   Mliss Fish 09/30/2024, 8:38 AM

## 2024-09-30 NOTE — Progress Notes (Signed)
 " [facility] Progress Note   Subjective:    Patient vagaled on HD this morning. Patient placed in T-burg position and UF turned off ( removed). 500ml NS bolus also given. Patient then started responding and returned to baseline. Plan to leave UF off for the duration of treatment.  Objective Vitals:   09/30/24 0900 09/30/24 0927 09/30/24 0931 09/30/24 0932  BP: (!) 121/58 (!) 69/55 (!) 75/53 (!) 98/57  Pulse: 90 100 (!) 105 95  Resp: 11 11 (!) 28 (!) 23  Temp:      TempSrc:      SpO2: 96% 95% 97% 96%  Weight:      Height:       Physical Exam General: Awake, alert, thin, NAD Heart: S1 and S2; No murmurs, gallops, or rubs Lungs: Clear anteriorly Abdomen: Soft and non-tender Extremities: No LE edema Dialysis Access: L AVF   Filed Weights   09/29/24 1719 09/30/24 0828  Weight: 53.2 kg 54 kg   No intake or output data in the 24 hours ending 09/30/24 0942  Additional Objective Labs: Basic Metabolic Panel: Recent Labs  Lab 09/23/24 1002 09/29/24 1319 09/30/24 0142  NA 145* 145 140  K 4.1 3.9 4.9  CL 99 110 98  CO2 25 21* 27  GLUCOSE 84 66* 83  BUN 18 51* 70*  CREATININE 4.86* 6.49* 9.06*  CALCIUM  8.8 6.3* 9.0   Liver Function Tests: Recent Labs  Lab 09/23/24 1002 09/29/24 1319 09/30/24 0142  AST 13 22 22   ALT 6 <5 7  ALKPHOS 295* 200* 259*  BILITOT 0.3 <0.2 <0.2  PROT 6.2 4.3* 5.6*  ALBUMIN  3.6* 2.4* 3.1*   No results for input(s): LIPASE, AMYLASE in the last 168 hours. CBC: Recent Labs  Lab 09/23/24 1002 09/29/24 1122 09/30/24 0142  WBC 3.2* 2.3* 3.3*  NEUTROABS 1.9  --   --   HGB 13.3 12.8 12.0  HCT 41.5 40.2 37.2  MCV 106* 106.9* 104.5*  PLT 51* 54* 51*   Blood Culture    Component Value Date/Time   SDES URINE, RANDOM 09/20/2024 1550   SPECREQUEST  09/20/2024 1550    NONE Reflexed from [id number] Performed at [facility], [address], [place], [place] [address]    CULT 60,000 COLONIES/mL ENTEROCOCCUS  FAECALIS (A) 09/20/2024 1550   REPTSTATUS 09/22/2024 FINAL 09/20/2024 1550    Cardiac Enzymes: No results for input(s): CKTOTAL, CKMB, CKMBINDEX, TROPONINI in the last 168 hours. CBG: Recent Labs  Lab 09/29/24 1513 09/29/24 1608 09/29/24 1916 09/30/24 0756 09/30/24 0933  GLUCAP 78 91 78 88 98   Iron Studies: No results for input(s): IRON, TIBC, TRANSFERRIN, FERRITIN in the last 72 hours. Lab Results  Component Value Date   INR 1.0 08/12/2024   INR 1.1 08/05/2022   Studies/Results: CT Head Wo Contrast Result Date: 09/29/2024 EXAM: CT HEAD WITHOUT CONTRAST 09/29/2024 01:28:56 PM TECHNIQUE: CT of the head was performed without the administration of intravenous contrast. Automated exposure control, iterative reconstruction, and/or weight based adjustment of the mA/kV was utilized to reduce the radiation dose to as low as reasonably achievable. COMPARISON: Head CT and MRI 12/325. CLINICAL HISTORY: Mental status change, unknown cause. FINDINGS: BRAIN AND VENTRICLES: There is no evidence of an acute infarct, intracranial hemorrhage, mass, midline shift, hydrocephalus, or extra-axial fluid collection. The right MCA infarct on last month's studies has evolved, and there is now a small region of developing encephalomalacia in the right insula and frontoparietal operculum. Confluent hypodensities in the  cerebral white matter bilaterally are nonspecific but compatible with severe chronic small vessel ischemic disease. A 1 cm calcified mass projecting from the undersurface of the right tentorial leaflet into the cerebellopontine angle cistern is unchanged and compatible with a meningioma. There is mild cerebral atrophy. Calcified atherosclerosis at the skull base. ORBITS: Bilateral cataract extraction. SINUSES: No acute abnormality. SOFT TISSUES AND SKULL: No acute soft tissue abnormality. No skull fracture. IMPRESSION: 1. No acute intracranial abnormality. 2. Expected interval evolution  of a small right MCA infarct. 3. Severe chronic small vessel ischemic disease. Electronically signed by: Dasie Hamburg MD 09/29/2024 01:37 PM EST RP Workstation: [id number]   DG Chest 2 View Result Date: 09/29/2024 CLINICAL DATA:  Shortness of breath with possible altered mental status. EXAM: DG CHEST 2V COMPARISON:  August 12, 2024 FINDINGS: The study is limited secondary to patient positioning. The cardiac silhouette is mildly enlarged and unchanged in size. There is marked severity calcification of the aortic arch. No acute infiltrate, pleural effusion or pneumothorax is identified. Multilevel degenerative changes are seen throughout the thoracic spine. IMPRESSION: 1. Limited study secondary to patient positioning, without acute cardiopulmonary disease. 2. Stable cardiomegaly. Electronically Signed   By: Suzen Dials M.D.   On: 09/29/2024 13:35    Medications:  anticoagulant sodium citrate       apixaban   2.5 mg Oral BID   aspirin  EC  81 mg Oral Daily   atorvastatin   40 mg Oral Daily   Chlorhexidine  Gluconate Cloth  6 each Topical Q0600   divalproex   500 mg Oral Daily   [START ON 10/01/2024] heparin   3,000 Units Dialysis Once in dialysis   insulin  aspart  0-6 Units Subcutaneous TID WC   levothyroxine   75 mcg Oral Q0600    Dialysis Orders: SW MWF 3h  B350  55kg 2K bath  AVF  Hep 3000 bolus + 2000 midrun Last OP HD 1/14  Assessment/Plan: # ESRD - on HD MWF, missed HD friday - pt vagaled on HD this morning. Placed in T-burg position and IVFs given. Patient then started responding and returned to baseline. Will leave UF off for the duration of today's treatment. Noted she's been leaving under EDW in outpatient. She endorses having a low appetite lately. Will not lower EDW given recent episode. Get standing weights here if possible.     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

## 2024-09-30 NOTE — Telephone Encounter (Signed)
 Pharmacy Patient Advocate Encounter  Insurance verification completed.    The patient is insured through [employer]. Patient has Medicare and is not eligible for a copay card, but may be able to apply for patient assistance or Medicare RX Payment Plan (Patient Must reach out to their plan, if eligible for payment plan), if available.    Ran test claim for Eliquis  5mg  tablet and the current 30 day co-pay is $40.   This test claim was processed through [facility]- copay amounts may vary at other pharmacies due to [employer], or as the patient moves through the different stages of their insurance plan.

## 2024-09-30 NOTE — Progress Notes (Signed)
 Patient's bp suddenly dropped from 121/58 to 69/55(61). Patient's eyeballs were rolled up,staring blankly up, not responding to verbal/sternal stimuli ,has had shallow breathing,she maintained /keep her vitals on the monitor .Normal saline boluses given successively ,bed on T-positions.HD staffs and Renal NP at the patient helped,patient back to her conscious/mentation baseline quick ,she responded verbally.

## 2024-09-30 NOTE — Evaluation (Signed)
 Physical Therapy Evaluation Patient Details Name: Norma Coleman MRN: [id number] DOB: 01-07-1933 Today's Date: 09/30/2024  History of Present Illness  Pt is an 89 y/o female presenting with AMS in setting of missed HD sessions and recently diagnosed UTI (1/10). Recent admission 12/2-12/10 for acute CVA. PMH: ESRD on HD, HTN, DM2, CHF, DVT, CVA  Clinical Impression  Pt is currently presenting at Max to mod A for bed mobility, Max A to Total A for sit to stand and taking side steps at EOB. Pt seems to have a significant delay in processing which is difficult because it is compounded pt pt is very HOH. Daughter presented near end of session and reports prior to this hospitalization pt was able to perform ADL's with set up assist and ambulate with supervision/CGA. Due to pt current functional status, home set up and available assistance at home recommending skilled physical therapy services < 3 hours/day in order to address strength, balance and functional mobility to decrease risk for falls, injury, immobility, skin break down and re-hospitalization.          If plan is discharge home, recommend the following: A little help with walking and/or transfers;Assistance with cooking/housework;Assist for transportation;Help with stairs or ramp for entrance;Supervision due to cognitive status   Can travel by private vehicle   No    Equipment Recommendations Wheelchair cushion (measurements PT);Wheelchair (measurements PT);Hospital bed     Functional Status Assessment Patient has had a recent decline in their functional status and demonstrates the ability to make significant improvements in function in a reasonable and predictable amount of time.     Precautions / Restrictions Precautions Precautions: Fall Recall of Precautions/Restrictions: Impaired Restrictions Weight Bearing Restrictions Per Provider Order: No      Mobility  Bed Mobility Overal bed mobility: Needs Assistance Bed  Mobility: Supine to Sit, Sit to Supine     Supine to sit: Max assist Sit to supine: Mod assist   General bed mobility comments: Max A for initiating LE to EOB and for trunk to mid line. Mod A for LE back to EOB    Transfers Overall transfer level: Needs assistance Equipment used: None, Rolling walker (2 wheels) Transfers: Sit to/from Stand             General transfer comment: attempted to stand with RW pt pushing posteriorly against RW. Stood wtih face to face with gait belt at Max A with rocking/counting with Max A for initial momentum to get to standing. Pt was Max a for wgt shifting in standing to take steps at EOB with multi modal cues for safety, sequencing and progression of LE with poor floor clearance.    Ambulation/Gait   Pre-gait activities: side stepping at EOB. Unable to progress gait further at this time.       Balance Overall balance assessment: Needs assistance Sitting-balance support: No upper extremity supported, Feet supported Sitting balance-Leahy Scale: Fair     Standing balance support: Bilateral upper extremity supported, During functional activity Standing balance-Leahy Scale: Poor         Pertinent Vitals/Pain Pain Assessment Pain Assessment: Faces Faces Pain Scale: Hurts little more Pain Location: R hip/knee with WB Pain Descriptors / Indicators: Discomfort, Grimacing Pain Intervention(s): Limited activity within patient's tolerance, Monitored during session, Repositioned    Home Living Family/patient expects to be discharged to:: Private residence Living Arrangements: Children Available Help at Discharge: Family;Available 24 hours/day Type of Home: House Home Access: Stairs to enter Entrance Stairs-Rails: Right;Left;Can reach both Entrance [employer]  of Steps: 5   Home Layout: Able to live on main level with bedroom/bathroom Home Equipment: Rollator (4 wheels);Cane - single point;BSC/3in1;Shower seat Additional Comments:  daughter has been assisting at home since stroke    Prior Function Prior Level of Function : Needs assist             Mobility Comments: using rollator for mobility at home with assistance at all times from family ADLs Comments: needs assist with ADL's since CVA requires assist with set up and sometimes Min A per daughter     Extremity/Trunk Assessment   Upper Extremity Assessment Upper Extremity Assessment: Defer to OT evaluation    Lower Extremity Assessment Lower Extremity Assessment: Generalized weakness    Cervical / Trunk Assessment Cervical / Trunk Assessment: Kyphotic  Communication   Communication Communication: Impaired Factors Affecting Communication: Hearing impaired;Difficulty expressing self    Cognition Arousal: Alert Behavior During Therapy: Flat affect   PT - Cognitive impairments: Awareness, Safety/Judgement, Sequencing, Problem solving, Initiation, Attention, Memory   Following commands: Impaired Following commands impaired: Follows one step commands with increased time, Follows one step commands inconsistently     Cueing Cueing Techniques: Verbal cues, Gestural cues, Tactile cues, Visual cues     General Comments General comments (skin integrity, edema, etc.): Pt was on room air initially with O2 sats 93 and HR 86 bpm. Sitting O2 sats 97% and HR 86 bpm after mobility with pt supine O2 sats 53% and HR 119 bpm. PLaced on 2L and nursing notified.        Assessment/Plan    PT Assessment Patient needs continued PT services  PT Problem List Decreased strength;Decreased activity tolerance;Decreased balance;Decreased mobility;Decreased safety awareness;Decreased cognition       PT Treatment Interventions DME instruction;Therapeutic exercise;Gait training;Balance training;Stair training;Functional mobility training;Therapeutic activities;Patient/family education;Neuromuscular re-education    PT Goals (Current goals can be found in the Care Plan  section)  Acute Rehab PT Goals Patient Stated Goal: to improve pt mobility PT Goal Formulation: With family Time For Goal Achievement: 10/14/24 Potential to Achieve Goals: Good    Frequency Min 2X/week        AM-PAC PT 6 Clicks Mobility  Outcome Measure Help needed turning from your back to your side while in a flat bed without using bedrails?: A Lot Help needed moving from lying on your back to sitting on the side of a flat bed without using bedrails?: A Lot Help needed moving to and from a bed to a chair (including a wheelchair)?: Total Help needed standing up from a chair using your arms (e.g., wheelchair or bedside chair)?: Total Help needed to walk in hospital room?: Total Help needed climbing 3-5 steps with a railing? : Total 6 Click Score: 8    End of Session Equipment Utilized During Treatment: Gait belt Activity Tolerance: Other (comment);Patient limited by fatigue (limited by mentation) Patient left: in bed;with call bell/phone within reach;with bed alarm set;with family/visitor present;with nursing/sitter in room Nurse Communication: Mobility status;Other (comment) (O2 sats) PT Visit Diagnosis: Unsteadiness on feet (R26.81);Other abnormalities of gait and mobility (R26.89);Muscle weakness (generalized) (M62.81)    Time: 8671-8647 PT Time Calculation (min) (ACUTE ONLY): 24 min   Charges:   PT Evaluation $PT Eval Low Complexity: 1 Low PT Treatments $Therapeutic Activity: 8-22 mins PT General Charges $$ ACUTE PT VISIT: 1 Visit        Dorothyann Maier, DPT, CLT  Acute Rehabilitation Services Office: [phone] (Secure chat preferred)   Dorothyann VEAR Maier 09/30/2024, 2:09 PM

## 2024-09-30 NOTE — Plan of Care (Signed)
" °  Problem: Coping: Goal: Ability to adjust to condition or change in health will improve Outcome: Progressing   Problem: Fluid Volume: Goal: Ability to maintain a balanced intake and output will improve Outcome: Progressing   Problem: Health Behavior/Discharge Planning: Goal: Ability to identify and utilize available resources and services will improve Outcome: Progressing Goal: Ability to manage health-related needs will improve Outcome: Progressing   Problem: Nutritional: Goal: Maintenance of adequate nutrition will improve Outcome: Progressing Goal: Progress toward achieving an optimal weight will improve Outcome: Progressing   Problem: Skin Integrity: Goal: Risk for impaired skin integrity will decrease Outcome: Progressing   Problem: Tissue Perfusion: Goal: Adequacy of tissue perfusion will improve Outcome: Progressing   Problem: Education: Goal: Knowledge of General Education information will improve Description: Including pain rating scale, medication(s)/side effects and non-pharmacologic comfort measures Outcome: Progressing   Problem: Clinical Measurements: Goal: Cardiovascular complication will be avoided Outcome: Progressing   Problem: Activity: Goal: Risk for activity intolerance will decrease Outcome: Progressing   Problem: Nutrition: Goal: Adequate nutrition will be maintained Outcome: Progressing   Problem: Coping: Goal: Level of anxiety will decrease Outcome: Progressing   Problem: Pain Managment: Goal: General experience of comfort will improve and/or be controlled Outcome: Progressing   Problem: Safety: Goal: Ability to remain free from injury will improve Outcome: Progressing   Problem: Skin Integrity: Goal: Risk for impaired skin integrity will decrease Outcome: Progressing   "

## 2024-09-30 NOTE — Hospital Course (Addendum)
 Norma Coleman is a 89 y.o. female with PMH of hypertension, hyperlipidemia, diabetes, hypothyroidism, CVA, chronic diastolic CHF, ESRD on HD, DVT presenting with altered mental status\along with worsening weakness and confusion /lethargy/sleepiness Patient was admitted to the neurology service 12/2-12/10 after acute CVA status post mechanical thrombectomy.  Complicated by new onset seizures for which patient was started on valproic  acid.  Also had some issues with anemia for which she received 2 units of PRBC transfusion.  Was discharged to rehab on dual antiplatelet therapy with plan for 30-day heart monitor. Patient had been doing okay other than treatment for a UTI for which she was started on Keflex  in the ED on 1/10.  Followed up with neurology for 5 days ago and lab workup showed low platelets and low white count so valproic  acid dose was adjusted and plan for repeat labs in a week and a half from now. (Family has not gotten the message for dose adjustment and have been continuing at the original dose.) Patient has developed worsening weakness, confusion, lethargy for the past week or so missed dialysis on Friday due to diarrhea and again 1/19 due to generalized weakness.  She can no longer walk with a walker.  She also has missed some doses of thyroid  medication In ED: Vital signs stable. bicarb 21, BUN 51, creatinine 6.49, glucose 66, calcium  6.3 which corrects to 7.6 considering albumin  of 2.4, protein 4.3, alk phos stable at 200.  CBC with platelets stable  w/ thrombocytopenia and leukopenia, proBNP elevated to 5252. RVP panel for flu COVID and RSV pending.  Urinalysis ordered.  CXR>.mild cardiomegaly and no acute abnormality. CT head showed no acute abnormality and expected evolution of prior infarct. Patient having intermittent confusion although slowly improving. Overall w/ poor quality of life and failure to thrive. Palliative care was consulted and after extensive family discussion they  want a transition to hospice stop dialysis and [facility] is being pursued  Subjective: Seen and examined Appears bit lethargic and sleepy on bedpan. Normal anxiety after getting Ativan . Daughter at the bedside Overnight remains afebrile BP stable, has been needing IV Dilaudid  and also getting Ativan   Assessment and plan:  End-of-life care: After extensive family meeting with palliative care patient and family has decided to transition to hospice Dialysis has been discontinued by nephrology>Waiting for bed at inpatient hospice-?  If able to go home with hospice but daughter endorse she will not be able to care for her. TOC, PMT and hospice team closely evaluating  Failure to thrive ESRD on HD MWF Vasovagal episode during dialysis 1/20: Acute toxic metabolic encephalopathy:-Likely toxic metabolic. Mentation worsening ever since her stroke, also having weakness no longer able to walk with a walker and having failure to thrive.  CT head was obtained, and neurology was curb sided New onset atrial fibrillation-Eliquis  started but held due to thrombocytopenia Severe hypothyroidism-secondary to noncompliance: History of CVA-Recent admit for MCA CVA s/pmechanical thrombectomy Seizure Disorder Thrombocytopenia, leukopenia Hyperlipidemia Type II diabetes Hypertension: Anemia of ESRD  DVT prophylaxis: NONE Code Status:   Code Status: Do not attempt resuscitation (DNR) - Comfort care Family Communication: plan of care discussed with patient/discussed with her younger daughter at the bedside Patient status is: Remains hospitalized because of severity of illness Level of care: Palliative Care   Dispo: The patient is from: home            Anticipated disposition: Planning for inpatient hospice.  Daughter unable to take care of her at home W/ [facility]  Objective: Vitals last 24 hrs: Vitals:   10/03/24 0820 10/03/24 1946 10/03/24 2136 10/04/24 0647  BP: 118/66 105/60 107/65 (!) 102/59   Pulse: 85 74 79 78  Resp: 20 16 14 16   Temp: 97.7 F (36.5 C) 98.6 F (37 C) 97.8 F (36.6 C) 97.8 F (36.6 C)  TempSrc: Oral  Oral   SpO2: 96% 99% 94% 96%  Weight:      Height:       Physical Examination: General exam: Thin frail ill-appearing, sleeping Rest of exam deferred for comfort  Medications reviewed: Scheduled Meds:  divalproex   500 mg Oral Daily   LORazepam   1 mg Oral Q6H   Or   LORazepam   1 mg Intravenous Q6H

## 2024-09-30 NOTE — Progress Notes (Signed)
 Pt receives out-pt HD at [facility] [place] on MWF 11:55 am chair time. Will assist as needed.   Randine Mungo Dialysis Navigator [phone]

## 2024-10-01 DIAGNOSIS — G9340 Encephalopathy, unspecified: Secondary | ICD-10-CM | POA: Diagnosis not present

## 2024-10-01 LAB — COMPREHENSIVE METABOLIC PANEL WITH GFR
ALT: 8 U/L (ref 0–44)
AST: 27 U/L (ref 15–41)
Albumin: 3.1 g/dL — ABNORMAL LOW (ref 3.5–5.0)
Alkaline Phosphatase: 254 U/L — ABNORMAL HIGH (ref 38–126)
Anion gap: 11 (ref 5–15)
BUN: 27 mg/dL — ABNORMAL HIGH (ref 8–23)
CO2: 31 mmol/L (ref 22–32)
Calcium: 9 mg/dL (ref 8.9–10.3)
Chloride: 93 mmol/L — ABNORMAL LOW (ref 98–111)
Creatinine, Ser: 4.71 mg/dL — ABNORMAL HIGH (ref 0.44–1.00)
GFR, Estimated: 8 mL/min — ABNORMAL LOW
Glucose, Bld: 86 mg/dL (ref 70–99)
Potassium: 3.9 mmol/L (ref 3.5–5.1)
Sodium: 134 mmol/L — ABNORMAL LOW (ref 135–145)
Total Bilirubin: 0.3 mg/dL (ref 0.0–1.2)
Total Protein: 5.5 g/dL — ABNORMAL LOW (ref 6.5–8.1)

## 2024-10-01 LAB — CBC
HCT: 35 % — ABNORMAL LOW (ref 36.0–46.0)
Hemoglobin: 11.5 g/dL — ABNORMAL LOW (ref 12.0–15.0)
MCH: 33.5 pg (ref 26.0–34.0)
MCHC: 32.9 g/dL (ref 30.0–36.0)
MCV: 102 fL — ABNORMAL HIGH (ref 80.0–100.0)
Platelets: DECREASED K/uL (ref 150–400)
RBC: 3.43 MIL/uL — ABNORMAL LOW (ref 3.87–5.11)
RDW: 15.6 % — ABNORMAL HIGH (ref 11.5–15.5)
WBC: 2.9 K/uL — ABNORMAL LOW (ref 4.0–10.5)
nRBC: 0 % (ref 0.0–0.2)

## 2024-10-01 LAB — HEPATITIS B SURFACE ANTIBODY, QUANTITATIVE: Hep B S AB Quant (Post): 497 m[IU]/mL

## 2024-10-01 LAB — CORTISOL-AM, BLOOD: Cortisol - AM: 20.8 ug/dL (ref 6.7–22.6)

## 2024-10-01 LAB — PLATELET COUNT: Platelets: DECREASED K/uL (ref 150–400)

## 2024-10-01 LAB — GLUCOSE, CAPILLARY
Glucose-Capillary: 107 mg/dL — ABNORMAL HIGH (ref 70–99)
Glucose-Capillary: 119 mg/dL — ABNORMAL HIGH (ref 70–99)

## 2024-10-01 MED ORDER — ONDANSETRON HCL 4 MG/2ML IJ SOLN: 4.0000 mg | Freq: Four times a day (QID) | INTRAMUSCULAR | Status: DC | PRN

## 2024-10-01 MED ORDER — HALOPERIDOL LACTATE 2 MG/ML PO CONC: 0.5000 mg | ORAL | Status: DC | PRN

## 2024-10-01 MED ORDER — BIOTENE DRY MOUTH MT LIQD: 15.0000 mL | OROMUCOSAL | Status: DC | PRN

## 2024-10-01 MED ORDER — LORAZEPAM 2 MG/ML IJ SOLN: 1.0000 mg | INTRAMUSCULAR | Status: DC | PRN

## 2024-10-01 MED ORDER — HALOPERIDOL 0.5 MG PO TABS: 0.5000 mg | ORAL_TABLET | ORAL | Status: DC | PRN

## 2024-10-01 MED ORDER — LORAZEPAM 2 MG/ML PO CONC: 1.0000 mg | ORAL | Status: DC | PRN

## 2024-10-01 MED ORDER — OXYCODONE HCL 20 MG/ML PO CONC
5.0000 mg | ORAL | Status: DC | PRN
  Administered 2024-10-06 – 2024-10-07 (×2): 5 mg via ORAL
  Filled 2024-10-01 (×2): qty 0.5

## 2024-10-01 MED ORDER — HALOPERIDOL LACTATE 5 MG/ML IJ SOLN: 0.5000 mg | INTRAMUSCULAR | Status: DC | PRN

## 2024-10-01 MED ORDER — DIPHENHYDRAMINE HCL 50 MG/ML IJ SOLN: 12.5000 mg | INTRAMUSCULAR | Status: DC | PRN

## 2024-10-01 MED ORDER — HYDROMORPHONE HCL 1 MG/ML IJ SOLN: 0.5000 mg | INTRAMUSCULAR | Status: DC | PRN

## 2024-10-01 MED ORDER — POLYVINYL ALCOHOL 1.4 % OP SOLN: 1.0000 [drp] | Freq: Four times a day (QID) | OPHTHALMIC | Status: DC | PRN

## 2024-10-01 MED ORDER — LORAZEPAM 1 MG PO TABS: 1.0000 mg | ORAL_TABLET | ORAL | Status: DC | PRN

## 2024-10-01 MED ORDER — GLYCOPYRROLATE 0.2 MG/ML IJ SOLN: 0.2000 mg | INTRAMUSCULAR | Status: DC | PRN

## 2024-10-01 MED ORDER — CHLORHEXIDINE GLUCONATE CLOTH 2 % EX PADS: 6.0000 | MEDICATED_PAD | Freq: Every day | CUTANEOUS | Status: DC

## 2024-10-01 MED ORDER — GLYCOPYRROLATE 1 MG PO TABS: 1.0000 mg | ORAL_TABLET | ORAL | Status: DC | PRN

## 2024-10-01 MED ORDER — ONDANSETRON 4 MG PO TBDP: 4.0000 mg | ORAL_TABLET | Freq: Four times a day (QID) | ORAL | Status: DC | PRN

## 2024-10-01 MED ORDER — OXYCODONE HCL 20 MG/ML PO CONC: 5.0000 mg | ORAL | Status: DC | PRN

## 2024-10-01 NOTE — Plan of Care (Signed)
" °  Problem: Education: Goal: Ability to describe self-care measures that may prevent or decrease complications (Diabetes Survival Skills Education) will improve Outcome: Progressing   Problem: Coping: Goal: Ability to adjust to condition or change in health will improve Outcome: Progressing   Problem: Fluid Volume: Goal: Ability to maintain a balanced intake and output will improve Outcome: Progressing   Problem: Health Behavior/Discharge Planning: Goal: Ability to identify and utilize available resources and services will improve Outcome: Progressing   Problem: Metabolic: Goal: Ability to maintain appropriate glucose levels will improve Outcome: Progressing   Problem: Nutritional: Goal: Maintenance of adequate nutrition will improve Outcome: Progressing Goal: Progress toward achieving an optimal weight will improve Outcome: Progressing   Problem: Skin Integrity: Goal: Risk for impaired skin integrity will decrease Outcome: Progressing   Problem: Tissue Perfusion: Goal: Adequacy of tissue perfusion will improve Outcome: Progressing   Problem: Education: Goal: Knowledge of General Education information will improve Description: Including pain rating scale, medication(s)/side effects and non-pharmacologic comfort measures Outcome: Progressing   Problem: Clinical Measurements: Goal: Diagnostic test results will improve Outcome: Progressing Goal: Respiratory complications will improve Outcome: Progressing Goal: Cardiovascular complication will be avoided Outcome: Progressing   Problem: Nutrition: Goal: Adequate nutrition will be maintained Outcome: Progressing   Problem: Coping: Goal: Level of anxiety will decrease Outcome: Progressing   Problem: Pain Managment: Goal: General experience of comfort will improve and/or be controlled Outcome: Progressing   Problem: Safety: Goal: Ability to remain free from injury will improve Outcome: Progressing   Problem: Skin  Integrity: Goal: Risk for impaired skin integrity will decrease Outcome: Progressing   "

## 2024-10-01 NOTE — Telephone Encounter (Signed)
 Attempted to contact pt, no answer. Looks like pt is currently in hospital. Will attempt to reach pt at a later date.  No DPR on file to release information to anyone else.

## 2024-10-01 NOTE — Progress Notes (Signed)
 " PROGRESS NOTE Norma Coleman  FMW:[id number] DOB: 06/15/1933 DOA: 09/29/2024 PCP: Corlis Pagan, NP  Brief Narrative/Hospital Course: Norma Coleman is a 89 y.o. female with PMH of hypertension, hyperlipidemia, diabetes, hypothyroidism, CVA, chronic diastolic CHF, ESRD on HD, DVT presenting with altered mental status\along with worsening weakness and confusion /lethargy/sleepiness  Patient was admitted to the neurology service 12/2-12/10 after acute CVA status post mechanical thrombectomy.  Complicated by new onset seizures for which patient was started on valproic  acid.  Also had some issues with anemia for which she received 2 units of PRBC transfusion.  Was discharged to rehab on dual antiplatelet therapy with plan for 30-day heart monitor. Patient had been doing okay other than treatment for a UTI for which she was started on Keflex  in the ED on 1/10.  Followed up with neurology for 5 days ago and lab workup showed low platelets and low white count so valproic  acid dose was adjusted and plan for repeat labs in a week and a half from now. (Family has not gotten the message for dose adjustment and have been continuing at the original dose.)   Patient has developed worsening weakness, confusion, lethargy for the past week or so missed dialysis on Friday due to diarrhea and again 1/19 due to generalized weakness.  She can no longer walk with a walker.  She also has missed some doses of thyroid  medication In ED: Vital signs stable. bicarb 21, BUN 51, creatinine 6.49, glucose 66, calcium  6.3 which corrects to 7.6 considering albumin  of 2.4, protein 4.3, alk phos stable at 200.  CBC with platelets stable  w/ thrombocytopenia and leukopenia, proBNP elevated to 5252. RVP panel for flu COVID and RSV pending.  Urinalysis ordered.  CXR>.mild cardiomegaly and no acute abnormality. CT head showed no acute abnormality and expected evolution of prior infarct.  Subjective: Seen and examined She is more alert  awake oriented able to answer questions appropriately oriented x 3 Overnight remains afebrile vital stable on room air, labs shows elevated BUN/creatinine at 4.7 leukopenia 2.9 hemoglobin 11.5 PLT clumping despite recheck  Assessment and plan:  ESRD on HD MWF: Missed HD, nephrology following closely input appreciated monitor labs and volume.  S/p HD 1/20  Vasovagal episode during dialysis 1/20: Patient had episode of hypotension/altered mental status that worsen but resolved with IV fluids,T position.  Her weight has been on the drier lower side-nephro adjusting dry weight.  Am sortisol stable 30  Acute toxic metabolic encephalopathy: Likely toxic metabolic. Mentation worsening ever since her stroke, also having weakness no longer able to walk with a walker.  Missed dialysis likely contributing also with severe hypothyroidism.Wondering if valproic  acid is contributing.  But level therapeutic discussed with Dr. Voncile does not feel that is the etiology-check valproate level 1/22, continue Depakote  at currentdose. No obvious infection noticed, CT head no acute finding.  Mental status has been fluctuating but appears to have improved significantly, nonfocal, ammonia level normal. Follow-up urinalysis but she is on HD Cont delirium precaution fall precaution  Planning for SNF   New onset atrial fibrillation: Patient has been ordered a 30-day monitor but this had not been applied yet by family because they just recently got it upon discharge from facility. In ED was in A-fib, rate controlled, hold off on nodal blocking agent now started Eliquis  at 2.5 mg twice daily-but with her platelet count dropping -this morning clumped unable to quantify, will hold Eliquis  for now   Severe hypothyroidism: Per daughter patient was likely  not taking for at least 2 weeks daughter manages most of the medication except for Synthroid  which patient is supposed to take during night or early morning when she wakes up. TSH  elevated in 90s, Synthroid  resumed at 75 mcg Recheck TSH with free T4 T 3 IN 3 to 4 weeks   History of CVA Recent admission by neurology service last month for MCA CVA status post mechanical thrombectomy.  Discharged on dual antiplatelet therapy with 30-day heart monitor (30-day monitor not yet started, however patient found to be in A-fib in the ED).  Complicated by onset of seizures.CT head stable Continue aspirin  Completed Plavix  and now started on Eliquis  as above.   Seizure Disorder: Depakote  level therapeutic continue continue current dose as above and monitor level.   Recently had valproic  acid dose adjusted due to thrombocytopenia and leukopenia.Leukopenia and thrombocytopenia are persistent we will continue to monitor these as dose change was recentlyT  Thrombocytopenia, leukopenia Acute on chronic recent platelet count 129 in December and decreasing since then was 51 on 1/13,Monitor CBC Recent Labs  Lab 09/29/24 1122 09/30/24 0142 10/01/24 0443 10/01/24 0919  HGB 12.8 12.0 11.5*  --   HCT 40.2 37.2 35.0*  --   WBC 2.3* 3.3* 2.9*  --   PLT 54* 51* PLATELET CLUMPS NOTED ON SMEAR, COUNT APPEARS DECREASED PLATELET CLUMPS NOTED ON SMEAR, COUNT APPEARS DECREASED    Hyperlipidemia Continue home statin   Diabetes Cont  SSI  Recent Labs  Lab 09/30/24 1303 09/30/24 1404 09/30/24 1540 09/30/24 2227 10/01/24 0834  GLUCAP 75 98 101* 128* 107*    Hypertension: BP stable.  Not on meds.  Anemia of ESRD Stable:  Hyperlipidemia Continue home statin  Goals of care: DNR limited but patient appears malnourished and frail.  Overall prognosis not bad.  Palliative care consulted.    Mobility: PT Orders: Active PT Follow up Rec: Skilled Nursing-Short Term Rehab (<3 Hours/Day)09/30/2024 1406   DVT prophylaxis:  Code Status:   Code Status: Limited: Do not attempt resuscitation (DNR) -DNR-LIMITED -Do Not Intubate/DNI  Family Communication: plan of care discussed with  patient/discussed with daughter at bedside 1/20.  No family at bedside today Patient status is: Remains hospitalized because of severity of illness Level of care: Telemetry   Dispo: The patient is from: home            Anticipated disposition: TBD Objective: Vitals last 24 hrs: Vitals:   10/01/24 0016 10/01/24 0424 10/01/24 0843 10/01/24 0844  BP: 113/78 119/78 (!) 157/104 (!) 153/113  Pulse: 82 78 99 (!) 104  Resp: 17 17 18 18   Temp: 97.8 F (36.6 C) 97.8 F (36.6 C)    TempSrc:      SpO2: 94% 94% 96% 95%  Weight:      Height:        Physical Examination: General exam: Alert awake oriented HEENT:Oral mucosa moist, Ear/Nose WNL grossly Respiratory system: Bilaterally clear BS,no use of accessory muscle Cardiovascular system: S1 & S2 +, No JVD. Gastrointestinal system: Abdomen soft,NT,ND, BS+ Nervous System: Alert, awake, moving all extremities,and following commands. Extremities: extremities warm, leg edema neg Skin: Warm, no rashes, bruises on her arms MSK: Very weak muscle bulk,tone, power   Medications reviewed: Scheduled Meds:  aspirin  EC  81 mg Oral Daily   atorvastatin   40 mg Oral Daily   Chlorhexidine  Gluconate Cloth  6 each Topical Q0600   divalproex   500 mg Oral Daily   insulin  aspart  0-6 Units Subcutaneous TID WC  levothyroxine   75 mcg Oral Q0600   Continuous Infusions: Diet: Diet Order             Diet renal with fluid restriction Fluid restriction: 1200 mL Fluid; Room service appropriate? Yes; Fluid consistency: Thin  Diet effective now                    Unresulted Labs (From admission, onward)     Start     Ordered   10/02/24 0500  Valproic  acid level  Once,   R        09/30/24 1433   09/29/24 1117  Urinalysis, Routine w reflex microscopic -Urine, Catheterized  Once,   URGENT       Question Answer Comment  Obtain urine by in and out catheter if not obtained within 30 minutes of placing in a treatment room? Yes   Specimen Source Urine,  Catheterized      09/29/24 1116           Data Reviewed: I have personally reviewed following labs and imaging studies ( see epic result tab) CBC: Recent Labs  Lab 09/29/24 1122 09/30/24 0142 10/01/24 0443 10/01/24 0919  WBC 2.3* 3.3* 2.9*  --   HGB 12.8 12.0 11.5*  --   HCT 40.2 37.2 35.0*  --   MCV 106.9* 104.5* 102.0*  --   PLT 54* 51* PLATELET CLUMPS NOTED ON SMEAR, COUNT APPEARS DECREASED PLATELET CLUMPS NOTED ON SMEAR, COUNT APPEARS DECREASED   CMP: Recent Labs  Lab 09/29/24 1319 09/30/24 0142 10/01/24 0443  NA 145 140 134*  K 3.9 4.9 3.9  CL 110 98 93*  CO2 21* 27 31  GLUCOSE 66* 83 86  BUN 51* 70* 27*  CREATININE 6.49* 9.06* 4.71*  CALCIUM  6.3* 9.0 9.0  MG 1.8  --   --    GFR: Estimated Creatinine Clearance: 6.4 mL/min (A) (by C-G formula based on SCr of 4.71 mg/dL (H)). Recent Labs  Lab 09/29/24 1319 09/30/24 0142 10/01/24 0443  AST 22 22 27   ALT 5 7 8   ALKPHOS 200* 259* 254*  BILITOT <0.2 <0.2 0.3  PROT 4.3* 5.6* 5.5*  ALBUMIN  2.4* 3.1* 3.1*   No results for input(s): LIPASE, AMYLASE in the last 168 hours.  Recent Labs  Lab 09/29/24 1949  AMMONIA 31   Coagulation Profile: No results for input(s): INR, PROTIME in the last 168 hours. Antimicrobials/Microbiology: Anti-infectives (From admission, onward)    None         Component Value Date/Time   SDES URINE, RANDOM 09/20/2024 1550   SPECREQUEST  09/20/2024 1550    NONE Reflexed from [id number] Performed at [facility], [address] [place], [place] [address]    CULT 60,000 COLONIES/mL ENTEROCOCCUS FAECALIS (A) 09/20/2024 1550   REPTSTATUS 09/22/2024 FINAL 09/20/2024 1550    Procedures:    Mennie LAMY, MD [facility] 10/01/2024, 11:44 AM   "

## 2024-10-01 NOTE — Consult Note (Signed)
 "                                                  Palliative Care Consult Note                                  Date: 10/01/2024   Patient Name: Norma Coleman  DOB:April 08, 1933  FMW:[id number]  Age / Sex:89 y.o., female  PCP: Corlis Pagan, NP Referring Physician: Christobal Guadalajara, MD  Reason for Consultation: Establishing goals of care  Past Medical History:  Diagnosis Date   DVT (deep venous thrombosis) (HCC)    GI bleed due to NSAIDs    High cholesterol    Hypertension    Hyperthyroidism    Pneumonia    Skin cancer    Thyroid  disease     Assessment & Plan:   HPI/Patient Profile: 89 y.o. 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 Per H&P on 09/29/2024 by Seena MD, patient experienced a week of weakness, confusion and lethargy requiring more assistance with ADLs than normal compared to prior when she needed minimal assistance. Right MCA infarct on 08/12/2024, with expected interval evolution of right MCA infarct on 09/29/2024. Patient's overall function and cognition worsened since her infarct on 08/2024. Of note patient recently had UTI on 09/20/2024 and treated with Keflex . Per nephrology note by Lenon NP, patient had a vasovagal episode with altered mentation on 09/30/2024 during HD session which resolved after IVF and trendelenburg position. Palliative medicine consulted for goals of care conversation.   Met with patient's daughter Norma) who shared that the patient has been deteriorating with a 10 lb weight loss since discharge from rehab in December, poor PO intake, and increased maximum assistance for ADLs. Daughter made decision to pursue full comfort measures and cease life prolonging interventions including hemodialysis.   SUMMARY OF RECOMMENDATIONS   DNR-comfort Next of kin decision maker is daughter Norma) TOC order placed to  have patient evaluated for inpatient hospice Palliative medicine will continue to follow Spiritual care consult placed for prayers Symptom management as below  Symptom Management:  Oxycodone /dilaudid  pain/dyspnea/increased work of breathing/RR>25 given the patient's ESRD Tylenol  PRN pain/fever Biotin PRN daily Benadryl  PRN itching Robinul  PRN secretions Haldol  PRN agitation/delirium Ativan  PRN anxiety/seizure/sleep/distress Zofran  PRN nausea/vomiting Liquifilm Tears PRN dry eye  Code Status: DNR - Comfort  Prognosis:  < 2 weeks  Discharge Planning:  To Be Determined   Discussed with: Hospitalist Kc MD and nephrology NP Lenon  10/01/2024 about family's decision to transition to full comfort measures and stopping HD, plan for inpatient hospice evaluation.   Subjective:   Reviewed medical records, received report from team, assessed the patient and then meet at the patient's bedside to discuss diagnosis, prognosis, GOC, EOL wishes disposition and options.  I met with patient at bedside, later met with the patient's daughter Norma Coleman) in a conference room.   We meet to discuss diagnosis prognosis, GOC, EOL wishes, disposition and options. Concept of Palliative Care was introduced as specialized medical care for people and their families living with serious illness.  If focuses on providing relief from the symptoms and stress of a serious illness.  The goal is to improve quality of life for both the patient and the family. Values and goals of care important to patient and family were attempted to be elicited.  Created space and opportunity for patient  and family to explore thoughts and feelings regarding current medical situation   Natural trajectory and current clinical status were discussed. Questions and concerns addressed. Patient encouraged to call with questions or concerns.    Patient/Family Understanding of Illness: - Patient altered and unable to comprehend her  overall health conditions - Reviewed with daughter and she expressed understanding of her mother's acute illnesses but also aware of the more chronic deterioration over the last couple of months including failure to thrive, poor PO intake, cognitive and functional decline since her right MCA infarct  Patient Values: - Patient is [employer]  Baseline Status: - Patient prior to her stroke was fairly independent and needed minimal assistance with ADL - Daughter shares that the patient's quality of life has drastically declined since her stroke and even more so recently in the last 2 weeks where she has needed 24/7 care when prior patient was more independent.  Today's Discussion: - Discussed the patient's overall health prior to hospitalization, current hospitalization, and next steps - Reviewed with the daughter that the patient's current altered mentation and current medical work up so far suggests possible UTI, electrolyte imbalance as a possible cause - Reviewed with daughter that repeat CT Head on 09/29/2024 did not demonstrate any abnormality or new infarct - Overall daughter shares that the patient's quality of life has deteriorated and does not think that the mother she knew from before will ever return - Burnard also shares the strain she has been under being a full time caregiver for her mother since she returned home after her infarct, she never thought she would have to care for her mother such as helping her bathe, dress, and clean herself and shared that it is has been very difficult for her but also very exhausting - Burnard shares that she was unsure if she could keep caring for her mother like this physically and mentally - Burnard mentioned that the patient just prior to our visit mentioned stopping dialysis as the patient herself seemed tired of it as well, Burnard is very aware that stopping dialysis will mean that time is limited for her mother - After discussion daughter decided to  transition to full comfort measures understanding that it would mean being more aggressive to treat any discomforts the patient may have including pain, shortness of breath, anxiety and it would also mean stopping life prolonging interventions including HD  - Burnard shared that she was worried that stopping dialysis would mean a painful passing, educated her that overall from prior experiences with other patients, passing away from stopping dialysis is fairly peaceful and the main thing is that as the toxins build up the patient will become more lethargic and confused, pain is not usually associated with stopping HD in most cases, but the medical team will be aggressive with symptom management to make sure her mother is as comfortable as possible - Reviewed code status with daughter who confirmed DNR and completed MOST form to indicate transition to full comfort measures   Review of Systems  Unable to perform ROS   Objective:   Primary Diagnoses: Present on Admission:  Hypothyroidism  Hyperlipidemia  Essential hypertension  ESRD (end stage renal disease) (HCC)  Chronic diastolic CHF (congestive heart failure) (HCC)  Acute encephalopathy  Encephalopathy acute  Vital Signs:  BP (!) 153/113 (BP Location: Right Arm)   Pulse (!) 104   Temp 97.8 F (36.6 C)   Resp 18   Ht 5' 2 (1.575 m)   Wt 54.2 kg   SpO2 95%   BMI 21.85 kg/m   Physical Exam HENT:     Head: Normocephalic and atraumatic.     Nose: Nose normal.     Mouth/Throat:     Mouth: Mucous membranes are dry.  Eyes:     Extraocular Movements: Extraocular movements intact.  Cardiovascular:     Rate and Rhythm: Normal rate.  Pulmonary:     Effort: Pulmonary effort is normal.  Skin:    General: Skin is warm.  Neurological:     Mental Status: She is alert. She is disoriented.     Palliative Assessment/Data: 30%    Thank you for allowing us  to participate in the care of KESLIE GRITZ PMT will continue to  support holistically.  I personally spent a total of 75 minutes in the care of the patient today including preparing to see the patient, getting/reviewing separately obtained history, performing a medically appropriate exam/evaluation, counseling and educating, placing orders, referring and communicating with other health care professionals, documenting clinical information in the EHR, and coordinating care.   Signed by: Fairy FORBES Shan DEVONNA Palliative Medicine Team  Team Phone # [phone] (Nights/Weekends)  10/01/2024, 1:10 PM   "

## 2024-10-01 NOTE — Progress Notes (Signed)
 I was contacted by the Palliative Care PA and informed patient's daughter has decided to transition Ms. Fernicola to full comfort care measures. Plan to discontinue tomorrow's HD orders and the Nephrology service will sign off. Please reach out to us  for any questions or concerns.  Charmaine Piety, NP [facility]

## 2024-10-01 NOTE — Progress Notes (Signed)
 " [facility] Progress Note   Subjective:    Seen and examined patient at bedside. She is oriented to herself and location but not to time. Denies SOB, CP, and N/V. She had a vasovagal episode on HD yesterday. UF was turned off and IVFs were given. She endorses low appetite at home. Next HD 1/22.  Objective Vitals:   10/01/24 0016 10/01/24 0424 10/01/24 0843 10/01/24 0844  BP: 113/78 119/78 (!) 157/104 (!) 153/113  Pulse: 82 78 99 (!) 104  Resp: 17 17 18 18   Temp: 97.8 F (36.6 C) 97.8 F (36.6 C)    TempSrc:      SpO2: 94% 94% 96% 95%  Weight:      Height:       Physical Exam General: Awake, alert, thin, NAD Heart: S1 and S2; No murmurs, gallops, or rubs Lungs: Clear anteriorly Abdomen: Soft and non-tender Extremities: No LE edema Dialysis Access: L AVF    Filed Weights   09/29/24 1719 09/30/24 0828 09/30/24 1222  Weight: 53.2 kg 54 kg 54.2 kg    Intake/Output Summary (Last 24 hours) at 10/01/2024 1235 Last data filed at 10/01/2024 0855 Gross per 24 hour  Intake 175 ml  Output 1000 ml  Net -825 ml    Additional Objective Labs: Basic Metabolic Panel: Recent Labs  Lab 09/29/24 1319 09/30/24 0142 10/01/24 0443  NA 145 140 134*  K 3.9 4.9 3.9  CL 110 98 93*  CO2 21* 27 31  GLUCOSE 66* 83 86  BUN 51* 70* 27*  CREATININE 6.49* 9.06* 4.71*  CALCIUM  6.3* 9.0 9.0   Liver Function Tests: Recent Labs  Lab 09/29/24 1319 09/30/24 0142 10/01/24 0443  AST 22 22 27   ALT 5 7 8   ALKPHOS 200* 259* 254*  BILITOT <0.2 <0.2 0.3  PROT 4.3* 5.6* 5.5*  ALBUMIN  2.4* 3.1* 3.1*   No results for input(s): LIPASE, AMYLASE in the last 168 hours. CBC: Recent Labs  Lab 09/29/24 1122 09/30/24 0142 10/01/24 0443 10/01/24 0919  WBC 2.3* 3.3* 2.9*  --   HGB 12.8 12.0 11.5*  --   HCT 40.2 37.2 35.0*  --   MCV 106.9* 104.5* 102.0*  --   PLT 54* 51* PLATELET CLUMPS NOTED ON SMEAR, COUNT APPEARS DECREASED PLATELET CLUMPS NOTED ON SMEAR, COUNT APPEARS  DECREASED   Blood Culture    Component Value Date/Time   SDES URINE, RANDOM 09/20/2024 1550   SPECREQUEST  09/20/2024 1550    NONE Reflexed from [id number] Performed at [facility], [address] [place], [place] [address]    CULT 60,000 COLONIES/mL ENTEROCOCCUS FAECALIS (A) 09/20/2024 1550   REPTSTATUS 09/22/2024 FINAL 09/20/2024 1550    Cardiac Enzymes: No results for input(s): CKTOTAL, CKMB, CKMBINDEX, TROPONINI in the last 168 hours. CBG: Recent Labs  Lab 09/30/24 1404 09/30/24 1540 09/30/24 2227 10/01/24 0834 10/01/24 1224  GLUCAP 98 101* 128* 107* 119*   Iron Studies: No results for input(s): IRON, TIBC, TRANSFERRIN, FERRITIN in the last 72 hours. Lab Results  Component Value Date   INR 1.0 08/12/2024   INR 1.1 08/05/2022   Studies/Results: CT Head Wo Contrast Result Date: 09/29/2024 EXAM: CT HEAD WITHOUT CONTRAST 09/29/2024 01:28:56 PM TECHNIQUE: CT of the head was performed without the administration of intravenous contrast. Automated exposure control, iterative reconstruction, and/or weight based adjustment of the mA/kV was utilized to reduce the radiation dose to as low as reasonably achievable. COMPARISON: Head CT and MRI 12/325. CLINICAL HISTORY: Mental status change,  unknown cause. FINDINGS: BRAIN AND VENTRICLES: There is no evidence of an acute infarct, intracranial hemorrhage, mass, midline shift, hydrocephalus, or extra-axial fluid collection. The right MCA infarct on last month's studies has evolved, and there is now a small region of developing encephalomalacia in the right insula and frontoparietal operculum. Confluent hypodensities in the cerebral white matter bilaterally are nonspecific but compatible with severe chronic small vessel ischemic disease. A 1 cm calcified mass projecting from the undersurface of the right tentorial leaflet into the cerebellopontine angle cistern is unchanged and compatible with a meningioma. There is mild  cerebral atrophy. Calcified atherosclerosis at the skull base. ORBITS: Bilateral cataract extraction. SINUSES: No acute abnormality. SOFT TISSUES AND SKULL: No acute soft tissue abnormality. No skull fracture. IMPRESSION: 1. No acute intracranial abnormality. 2. Expected interval evolution of a small right MCA infarct. 3. Severe chronic small vessel ischemic disease. Electronically signed by: Dasie Hamburg MD 09/29/2024 01:37 PM EST RP Workstation: [id number]   DG Chest 2 View Result Date: 09/29/2024 CLINICAL DATA:  Shortness of breath with possible altered mental status. EXAM: DG CHEST 2V COMPARISON:  August 12, 2024 FINDINGS: The study is limited secondary to patient positioning. The cardiac silhouette is mildly enlarged and unchanged in size. There is marked severity calcification of the aortic arch. No acute infiltrate, pleural effusion or pneumothorax is identified. Multilevel degenerative changes are seen throughout the thoracic spine. IMPRESSION: 1. Limited study secondary to patient positioning, without acute cardiopulmonary disease. 2. Stable cardiomegaly. Electronically Signed   By: Suzen Dials M.D.   On: 09/29/2024 13:35    Medications:   aspirin  EC  81 mg Oral Daily   atorvastatin   40 mg Oral Daily   Chlorhexidine  Gluconate Cloth  6 each Topical Q0600   divalproex   500 mg Oral Daily   insulin  aspart  0-6 Units Subcutaneous TID WC   levothyroxine   75 mcg Oral Q0600    Dialysis Orders: SW MWF 3h  B350  55kg 2K bath  AVF  Hep 3000 bolus + 2000 midrun Last OP HD 1/14   Assessment/Plan:  # ESRD - on HD MWF, missed HD 1/16 - pt vagaled on HD 1/20. Returned to baseline after IVFs and turning UF off. Noted she's been leaving under EDW in outpatient. She endorses having a low appetite lately. Will not lower EDW given recent episode. Get standing weights here if possible. -next HD 1/22 off schedule, will change back to MWF later this week     # HTN - bp's stable, not on any  bp lowering meds     # Volume - euvolemic on exam     # 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, follow    # AMS -Noted hx CVA 08/2024 and hx UTI last week -She still appears confused: oriented to herself and location but not to time and recent events here during this hospitalization - w/u per primary team  Charmaine Piety, NP [facility] 10/01/2024,12:35 PM  LOS: 1 day    "

## 2024-10-01 NOTE — Progress Notes (Signed)
 Transition of Care [facility]) - Inpatient Brief Assessment   Patient Details  Name: Norma Coleman MRN: [id number] Date of Birth: Nov 06, 1932  Transition of [facility]) CM/SW Contact:    Rosaline JONELLE Joe, RN Phone Number: 10/01/2024, 4:10 PM   Clinical Narrative: CM spoke with Palliative Care NP and patient's daughter has chosen for patient to be on comfort care.  The patient was living with the daughter at the home prior to admission.  The daughter is requesting INpatient hospice placement.  I called the daughter and provided Medicare choice regarding inpatient hospice facility and the daughter [facility].  I placed referral for iNpatient hospice with [facility] and they will evaluate and follow up.   Transition of Care Asessment: Insurance and Status: (P) Insurance coverage has been reviewed Patient has primary care physician: (P) Yes Home environment has been reviewed: (P) from home with daughter Prior level of function:: (P) family assistance Prior/Current Home Services: (P) Current home services Social Drivers of Health Review: (P) SDOH reviewed needs interventions Readmission risk has been reviewed: (P) Yes Transition of care needs: (P) transition of care needs identified, TOC will continue to follow

## 2024-10-01 NOTE — Plan of Care (Signed)

## 2024-10-01 NOTE — Progress Notes (Signed)
 Contacted [facility] to be advised that pt is being transitioned to comfort care and will not require HD at d/c.   Randine Mungo Dialysis Navigator [phone]

## 2024-10-02 DIAGNOSIS — G9340 Encephalopathy, unspecified: Secondary | ICD-10-CM | POA: Diagnosis not present

## 2024-10-02 MED ORDER — HYDROMORPHONE HCL 1 MG/ML IJ SOLN
0.5000 mg | INTRAMUSCULAR | Status: DC | PRN
  Filled 2024-10-02: qty 1

## 2024-10-02 MED ORDER — HYDROMORPHONE HCL 2 MG PO TABS
1.0000 mg | ORAL_TABLET | ORAL | Status: DC | PRN
  Administered 2024-10-02 – 2024-10-03 (×2): 1 mg via ORAL
  Filled 2024-10-02 (×3): qty 1

## 2024-10-02 NOTE — Progress Notes (Signed)
 " Daily Progress Note   Date: 10/02/2024   Patient Name: Norma Coleman  DOB: [birth year]  MRN: [id number]  Age / Sex: 89 y.o., female  Attending Physician: Christobal Guadalajara, MD Primary Care Physician: Corlis Pagan, NP Admit Date: 09/29/2024 Length of Stay: 2 days  Reason for Follow-up: Establishing goals of care, Non pain symptom management, Pain control, and Terminal Care  Past Medical History:  Diagnosis Date   DVT (deep venous thrombosis) (HCC)    GI bleed due to NSAIDs    High cholesterol    Hypertension    Hyperthyroidism    Pneumonia    Skin cancer    Thyroid  disease     Assessment & Plan:   HPI/Patient Profile:   89 y.o. 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 Per H&P on 09/29/2024 by Seena MD, patient experienced a week of weakness, confusion and lethargy requiring more assistance with ADLs than normal compared to prior when she needed minimal assistance. Right MCA infarct on 08/12/2024, with expected interval evolution of right MCA infarct on 09/29/2024. Patient's overall function and cognition worsened since her infarct on 08/2024. Of note patient recently had UTI on 09/20/2024 and treated with Keflex . Per nephrology note by Lenon NP, patient had a vasovagal episode with altered mentation on 09/30/2024 during HD session which resolved after IVF and trendelenburg position. Palliative medicine consulted for goals of care conversation.   Patient received tylenol  650 mg x1 and dilaudid  1 mg po x1 in the last 24 hours. Patient continues to have poor PO intake and remains altered.  SUMMARY OF RECOMMENDATIONS DNR - comfort NOK decision maker is Burnard Arts (daughter) Will have [facility] evaluate patient for inpatient eligibility as [facility] was not able to offer inpatient bed at this time Palliative medicine will continue to follow Added  Dilaudid  PO 1 mg for pain/dyspnea as patient lost IV access Symptom management as below  Symptom Management:  Oxycodone /dilaudid  pain/dyspnea/increased work of breathing/RR>25 given the patient's ESRD Tylenol  PRN pain/fever Biotin PRN daily Benadryl  PRN itching Robinul  PRN secretions Haldol  PRN agitation/delirium Ativan  PRN anxiety/seizure/sleep/distress Zofran  PRN nausea/vomiting Liquifilm Tears PRN dry eye  Code Status: DNR - Comfort  Prognosis: < 2 weeks  Discharge Planning: To Be Determined  Discussed with: Kc MD 10/02/2024 about adding dilaudid  PO for pain/dyspnea management as patient no longer as IV access.   Subjective:   Subjective: Chart Reviewed. Updates received. Patient Assessed. Created space and opportunity for patient  and family to explore thoughts and feelings regarding current medical situation.  Received a message from primary RN Rosalva who shared that patient no longer has IV access and needs medication for pain that was refractory to tylenol . Patient has PO/SL oxy ordered PRN but has not yet been delivered after 4 hours so dilaudid  PO 1 mg added for pain/dyspnea.  Discussed with TOC Kolar to have patient evaluated by a different hospice agency to see I patient would meet their inpatient criteria.   Today's Discussion:  Met with the patient today at bedside with daughter present. Patient remains pleasantly confused. Daughter shared that [facility] was not able to offer a bed for inpatient hospice but could consider initiating home with hospice if family was open. Daughter shares that she is not able to take her mother home with hospice and care for her as she would need a lot of support that she cannot adequately  provide. Reviewed with daughter that we can try to have a different hospice agency evaluate her mother for eligibility and she was agreeable to it.  Review of Systems  Unable to perform ROS   Objective:   Primary Diagnoses: Present on Admission:   Hypothyroidism  Hyperlipidemia  Essential hypertension  ESRD (end stage renal disease) (HCC)  Chronic diastolic CHF (congestive heart failure) (HCC)  Acute encephalopathy  Encephalopathy acute   Vital Signs:  BP 123/60 (BP Location: Right Arm)   Pulse 92   Temp (!) 97.3 F (36.3 C)   Resp 20   Ht 5' 2 (1.575 m)   Wt 54.2 kg   SpO2 98%   BMI 21.85 kg/m   Physical Exam Constitutional:      Appearance: She is ill-appearing.  HENT:     Head: Normocephalic and atraumatic.     Nose: Nose normal.     Mouth/Throat:     Mouth: Mucous membranes are dry.  Eyes:     Extraocular Movements: Extraocular movements intact.  Pulmonary:     Effort: Pulmonary effort is normal.  Skin:    General: Skin is warm and dry.  Neurological:     Mental Status: She is alert. She is disoriented.    Palliative Assessment/Data: 40%   Existing Vynca/ACP Documentation: MOST form completed  Thank you for allowing us  to participate in the care of FADUMO HENG PMT will continue to support holistically.  I personally spent a total of 35 minutes in the care of the patient today including preparing to see the patient, performing a medically appropriate exam/evaluation, counseling and educating, placing orders, referring and communicating with other health care professionals, documenting clinical information in the EHR, and coordinating care.   Fairy FORBES Shan DEVONNA  Palliative Medicine Team  Team Phone # [phone] (Nights/Weekends) 10/02/2024 4:34 PM  "

## 2024-10-02 NOTE — Progress Notes (Signed)
 [facility] [phone] [facility]  Hospice hospital liaison note   Received request from [facility] for family interest in hospice inpatient unit. Talked with patient's daughter Burnard by phone to discuss services and hospice philosophy of care and met at bedside with patient today. Patient's other daughter is flying into town today.   Chart reviewed by hospice physician and at this time patient does not meet criteria for hospice inpatient unit.    She is appropriate for hospice services in the home or LTC facility and we would be happy to reassess for inpatient unit appropriateness at a later time if requested.  Liaison explained this to [facility] and she will discuss home with hospice with her sister this afternoon.   Thank you for the opportunity to participate in this patient's care.    Greig Basket, BSN, RN Hospice hospital liaison [phone]

## 2024-10-02 NOTE — Progress Notes (Signed)
 " PROGRESS NOTE Norma Coleman  FMW:[id number] DOB: [birth year] DOA: 09/29/2024 PCP: Corlis Pagan, NP  Brief Narrative/Hospital Course: Norma Coleman is a 89 y.o. female with PMH of hypertension, hyperlipidemia, diabetes, hypothyroidism, CVA, chronic diastolic CHF, ESRD on HD, DVT presenting with altered mental status\along with worsening weakness and confusion /lethargy/sleepiness Patient was admitted to the neurology service 12/2-12/10 after acute CVA status post mechanical thrombectomy.  Complicated by new onset seizures for which patient was started on valproic  acid.  Also had some issues with anemia for which she received 2 units of PRBC transfusion.  Was discharged to rehab on dual antiplatelet therapy with plan for 30-day heart monitor. Patient had been doing okay other than treatment for a UTI for which she was started on Keflex  in the ED on 1/10.  Followed up with neurology for 5 days ago and lab workup showed low platelets and low white count so valproic  acid dose was adjusted and plan for repeat labs in a week and a half from now. (Family has not gotten the message for dose adjustment and have been continuing at the original dose.)   Patient has developed worsening weakness, confusion, lethargy for the past week or so missed dialysis on Friday due to diarrhea and again 1/19 due to generalized weakness.  She can no longer walk with a walker.  She also has missed some doses of thyroid  medication In ED: Vital signs stable. bicarb 21, BUN 51, creatinine 6.49, glucose 66, calcium  6.3 which corrects to 7.6 considering albumin  of 2.4, protein 4.3, alk phos stable at 200.  CBC with platelets stable  w/ thrombocytopenia and leukopenia, proBNP elevated to 5252. RVP panel for flu COVID and RSV pending.  Urinalysis ordered.  CXR>.mild cardiomegaly and no acute abnormality. CT head showed no acute abnormality and expected evolution of prior infarct. Patient having intermittent confusion although slowly  improving. Overall poor quality of life and failure to thrive. Palliative care was consulted and after extensive family discussion they want a transition to hospice stop dialysis and [facility] is being pursued  Subjective: Seen and examined Nursing at the bedside, patient refused to take p.o. meds this morning She appears alert awake interactive Denies any complaint  Assessment and plan:  End-of-life care: After extensive family meeting with palliative care patient and family has decided to transition to hospice Dialysis has been discontinued by nephrology Waiting for bed at [facility]  Failure to thrive ESRD on HD MWF Vasovagal episode during dialysis 1/20: Acute toxic metabolic encephalopathy:-Likely toxic metabolic. Mentation worsening ever since her stroke, also having weakness no longer able to walk with a walker and having failure to thrive.  CT head was obtained, and neurology was curb sided New onset atrial fibrillation-Eliquis  started but held due to thrombocytopenia Severe hypothyroidism-secondary to noncompliance: History of CVA-Recent admit for MCA CVA s/pmechanical thrombectomy Seizure Disorder Thrombocytopenia, leukopenia Hyperlipidemia Type II diabetes Hypertension: Anemia of ESRD  DVT prophylaxis: NONE Code Status:   Code Status: Do not attempt resuscitation (DNR) - Comfort care Family Communication: plan of care discussed with patient/discussed with daughter at bedside 1/20.  No family at bedside today Patient status is: Remains hospitalized because of severity of illness Level of care: Telemetry   Dispo: The patient is from: home            Anticipated disposition: TBD Objective: Vitals last 24 hrs: Vitals:   10/01/24 0844 10/01/24 1728 10/01/24 2051 10/02/24 0815  BP: (!) 153/113 126/80 124/74 132/70  Pulse: (!) 104  81 79 72  Resp: 18 18 17 20   Temp:   (!) 97.5 F (36.4 C) 98.1 F (36.7 C)  TempSrc:      SpO2: 95% 100% 97% 99%  Weight:       Height:        Physical Examination: General exam: Alert awake HEENT:Oral mucosa moist, Ear/Nose WNL grossly Respiratory system: Bilaterally clear BS,no use of accessory muscle Cardiovascular system: S1 & S2 +, No JVD. Gastrointestinal system: Abdomen soft,NT,ND, BS+ Nervous System: Alert, awake, moving all extremities,and following commands. Extremities: extremities warm, leg edema neg Skin: Warm, no rashes, bruises on her arms MSK: Very weak muscle bulk,tone, power   Medications reviewed: Scheduled Meds:  divalproex   500 mg Oral Daily    Unresulted Labs (From admission, onward)    None      Data Reviewed: I have personally reviewed following labs and imaging studies ( see epic result tab) CBC: Recent Labs  Lab 09/29/24 1122 09/30/24 0142 10/01/24 0443 10/01/24 0919  WBC 2.3* 3.3* 2.9*  --   HGB 12.8 12.0 11.5*  --   HCT 40.2 37.2 35.0*  --   MCV 106.9* 104.5* 102.0*  --   PLT 54* 51* PLATELET CLUMPS NOTED ON SMEAR, COUNT APPEARS DECREASED PLATELET CLUMPS NOTED ON SMEAR, COUNT APPEARS DECREASED   CMP: Recent Labs  Lab 09/29/24 1319 09/30/24 0142 10/01/24 0443  NA 145 140 134*  K 3.9 4.9 3.9  CL 110 98 93*  CO2 21* 27 31  GLUCOSE 66* 83 86  BUN 51* 70* 27*  CREATININE 6.49* 9.06* 4.71*  CALCIUM  6.3* 9.0 9.0  MG 1.8  --   --    GFR: Estimated Creatinine Clearance: 6.4 mL/min (A) (by C-G formula based on SCr of 4.71 mg/dL (H)). Recent Labs  Lab 09/29/24 1319 09/30/24 0142 10/01/24 0443  AST 22 22 27   ALT 5 7 8   ALKPHOS 200* 259* 254*  BILITOT <0.2 <0.2 0.3  PROT 4.3* 5.6* 5.5*  ALBUMIN  2.4* 3.1* 3.1*   No results for input(s): LIPASE, AMYLASE in the last 168 hours.  Recent Labs  Lab 09/29/24 1949  AMMONIA 31   Coagulation Profile: No results for input(s): INR, PROTIME in the last 168 hours. Antimicrobials/Microbiology: Anti-infectives (From admission, onward)    None         Component Value Date/Time   SDES URINE, RANDOM  09/20/2024 1550   SPECREQUEST  09/20/2024 1550    NONE Reflexed from [id number] Performed at [facility], [address], [place], [place] [address]    CULT 60,000 COLONIES/mL ENTEROCOCCUS FAECALIS (A) 09/20/2024 1550   REPTSTATUS 09/22/2024 FINAL 09/20/2024 1550    Procedures:  Mennie LAMY, MD [facility] 10/02/2024, 10:42 AM   "

## 2024-10-02 NOTE — Progress Notes (Signed)
 The chaplain responded to PMT PA-Joseph's consult for prayer. The Pt. is sitting in the bedside chair at the time of the visit.   The chaplain and the Pt. talked about her matching blanket and flowers. During the time together, the Pt. asked for forgiveness for her restlessness in the chair. The chaplain understands he Pt. is experiencing bottom pain. The Pt. believes because she is comfort care she can't have any pain medicine. The chaplain provided education on the meaning of comfort care and updated the Pt. RN-Tyreka.  While waiting on the RN, the Pt. shared her anticipated visit from her daughter-Holly today. The chaplain understands the Pt. has another daughter-Kelly who lives in [place].  The Pt. asked for a pause in the visit and accepted intercessory prayer from the chaplain.  Chaplain Leeroy Hummer [phone]

## 2024-10-02 NOTE — Plan of Care (Signed)

## 2024-10-02 NOTE — TOC Progression Note (Addendum)
 Transition of Care [facility]) - Progression Note    Patient Details  Name: Norma Coleman MRN: [id number] Date of Birth: 1932/11/03  Transition [facility] Care [facility]) CM/SW Contact  Rosaline JONELLE Joe, RN Phone Number: 10/02/2024, 11:00 AM  Clinical Narrative:    CM spoke with [facility] and no beds are available at Inpatient hospice facility today at this time.  10/02/24 1318 - [facility] declined the patient for INpatient Hospice placement.  The patient's daughter agreed to have referral placed with [facility] to Inpatient Hospice placement review.  Patient's daughter is at the bedside at this time and is aware that referral was placed.                     Expected Discharge Plan and Services                                               Social Drivers of Health (SDOH) Interventions SDOH Screenings   Food Insecurity: No Food Insecurity (09/29/2024)  Housing: Low Risk (09/29/2024)  Transportation Needs: No Transportation Needs (09/29/2024)  Utilities: Not At Risk (09/29/2024)  Social Connections: Socially Isolated (09/29/2024)  Tobacco Use: Low Risk (09/29/2024)    Readmission Risk Interventions     No data to display

## 2024-10-03 DIAGNOSIS — G9340 Encephalopathy, unspecified: Secondary | ICD-10-CM | POA: Diagnosis not present

## 2024-10-03 DIAGNOSIS — Z515 Encounter for palliative care: Secondary | ICD-10-CM | POA: Diagnosis not present

## 2024-10-03 MED ORDER — LORAZEPAM 2 MG/ML PO CONC: 1.0000 mg | Freq: Four times a day (QID) | ORAL | Status: DC

## 2024-10-03 MED ORDER — LORAZEPAM 2 MG/ML IJ SOLN
1.0000 mg | Freq: Four times a day (QID) | INTRAMUSCULAR | Status: DC
  Filled 2024-10-03: qty 1

## 2024-10-03 MED ORDER — LORAZEPAM 1 MG PO TABS
1.0000 mg | ORAL_TABLET | Freq: Four times a day (QID) | ORAL | Status: DC
  Administered 2024-10-03 – 2024-10-07 (×14): 1 mg via ORAL
  Filled 2024-10-03 (×15): qty 1

## 2024-10-03 NOTE — Progress Notes (Addendum)
 " Daily Progress Note   Date: 10/03/2024   Patient Name: Norma Coleman  DOB: 06/15/1933  MRN: [id number]  Age / Sex: 89 y.o., female  Attending Physician: Christobal Guadalajara, MD Primary Care Physician: Corlis Pagan, NP Admit Date: 09/29/2024 Length of Stay: 3 days  Reason for Follow-up: Establishing goals of care, Non pain symptom management, Pain control, and Terminal Care  Past Medical History:  Diagnosis Date   DVT (deep venous thrombosis) (HCC)    GI bleed due to NSAIDs    High cholesterol    Hypertension    Hyperthyroidism    Pneumonia    Skin cancer    Thyroid  disease     Assessment & Plan:   HPI/Patient Profile:   89 y.o. 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 Per H&P on 09/29/2024 by Seena MD, patient experienced a week of weakness, confusion and lethargy requiring more assistance with ADLs than normal compared to prior when she needed minimal assistance. Right MCA infarct on 08/12/2024, with expected interval evolution of right MCA infarct on 09/29/2024. Patient's overall function and cognition worsened since her infarct on 08/2024. Of note patient recently had UTI on 09/20/2024 and treated with Keflex . Per nephrology note by Lenon NP, patient had a vasovagal episode with altered mentation on 09/30/2024 during HD session which resolved after IVF and trendelenburg position. Palliative medicine consulted for goals of care conversation.    Patient received dilaudid  1 mg PO and ativan  1 mg PO in the last 24 hours. Refused Depakote  ER tablet, plan to schedule ativan  for anxiety and seizure prophylaxis in setting of patient refusing anti seizure medication. [facility] and [facility] denied the patient for inpatient hospice at this time due to lack of symptom management.   SUMMARY OF RECOMMENDATIONS DNR-comfort NOK decision  maker is daughter Burnard Arts Scheduled Ativan  1 mg PO Q6 for anxiety and seizure prophylaxis Continue to monitor for eligibility for inpatient hospice Order placed to transfer patient to 6N when bed available for patient preference  Symptom Management:  Oxycodone /dilaudid  pain/dyspnea/increased work of breathing/RR>25 given the patient's ESRD Tylenol  PRN pain/fever Biotin PRN daily Benadryl  PRN itching Robinul  PRN secretions Haldol  PRN agitation/delirium Ativan  Q6 anxiety/seizure/sleep/distress Zofran  PRN nausea/vomiting Liquifilm Tears PRN dry eye  Code Status: DNR - Comfort  Prognosis: < 2 weeks  Discharge Planning: To Be Determined  Discussed with: Kc MD 10/03/2024 about scheduling ativan  for anxiety and seizure prophylaxis and placing order to transfer patient to 6N as patient is becoming agitated in the room and dislikes it.   Subjective:   Subjective: Chart Reviewed. Updates received. Patient Assessed. Created space and opportunity for patient  and family to explore thoughts and feelings regarding current medical situation.  Today's Discussion: Met with the patient at bedside with daughter Eliazar) present and later joined by other daughter [name]). Patient shared that she was hoping going to hospice would mean she could leave the hospital. She is anxious about staying in the hospital room and dislikes the layout which is causing a lot of stress and anxiety to her. Reviewed that since the patient is not taking her anti seizure medication, I can schedule Ativan  to help with seizure prophylaxis as well as help with her anxiety. Silvano also inquires about allowing the patient to leave the room and shared with her that she can certainly leave with a wheelchair to get  out of the room and go around the hospital. Silvano also inquires about the patient being able to get a shower or bath and shared with her that she can get a shower or bath and I would notify the staff to assist with that.  Silvano inquires about having the patient transferred to a different room with a bigger bathroom and shared that I would do my best to see if I can get her transferred. Patient and family had no other questions at this time.   Review of Systems  Musculoskeletal:  Positive for back pain.  Psychiatric/Behavioral:         Anxiety    Objective:   Primary Diagnoses: Present on Admission:  Hypothyroidism  Hyperlipidemia  Essential hypertension  ESRD (end stage renal disease) (HCC)  Chronic diastolic CHF (congestive heart failure) (HCC)  Acute encephalopathy  Encephalopathy acute   Vital Signs:  BP 118/66 (BP Location: Right Arm)   Pulse 85   Temp 97.7 F (36.5 C) (Oral)   Resp 20   Ht 5' 2 (1.575 m)   Wt 54.2 kg   SpO2 96%   BMI 21.85 kg/m   Physical Exam HENT:     Head: Normocephalic and atraumatic.     Nose: Nose normal.     Mouth/Throat:     Mouth: Mucous membranes are dry.  Eyes:     Extraocular Movements: Extraocular movements intact.  Cardiovascular:     Rate and Rhythm: Normal rate.     Pulses: Normal pulses.  Pulmonary:     Effort: Pulmonary effort is normal.  Skin:    General: Skin is warm and dry.  Neurological:     Mental Status: She is alert and oriented to person, place, and time.     Comments: Oriented to self, situation, and time.   Psychiatric:        Mood and Affect: Mood normal.    Palliative Assessment/Data: 50%   Existing Vynca/ACP Documentation: MOST form in EMR  Thank you for allowing us  to participate in the care of JORDANNE ELSBURY PMT will continue to support holistically.  I personally spent a total of 50 minutes in the care of the patient today including preparing to see the patient, performing a medically appropriate exam/evaluation, counseling and educating, placing orders, referring and communicating with other health care professionals, and documenting clinical information in the EHR.   Fairy FORBES Shan DEVONNA  Palliative Medicine  Team  Team Phone # [phone] (Nights/Weekends) 10/03/2024 3:50 PM  "

## 2024-10-03 NOTE — Progress Notes (Signed)
 " PROGRESS NOTE Norma Coleman  FMW:[id number] DOB: Oct 11, 1932 DOA: 09/29/2024 PCP: Corlis Pagan, NP  Brief Narrative/Hospital Course: Norma Coleman is a 89 y.o. female with PMH of hypertension, hyperlipidemia, diabetes, hypothyroidism, CVA, chronic diastolic CHF, ESRD on HD, DVT presenting with altered mental status\along with worsening weakness and confusion /lethargy/sleepiness Patient was admitted to the neurology service 12/2-12/10 after acute CVA status post mechanical thrombectomy.  Complicated by new onset seizures for which patient was started on valproic  acid.  Also had some issues with anemia for which she received 2 units of PRBC transfusion.  Was discharged to rehab on dual antiplatelet therapy with plan for 30-day heart monitor. Patient had been doing okay other than treatment for a UTI for which she was started on Keflex  in the ED on 1/10.  Followed up with neurology for 5 days ago and lab workup showed low platelets and low white count so valproic  acid dose was adjusted and plan for repeat labs in a week and a half from now. (Family has not gotten the message for dose adjustment and have been continuing at the original dose.)  Patient has developed worsening weakness, confusion, lethargy for the past week or so missed dialysis on Friday due to diarrhea and again 1/19 due to generalized weakness.  She can no longer walk with a walker.  She also has missed some doses of thyroid  medication In ED: Vital signs stable. bicarb 21, BUN 51, creatinine 6.49, glucose 66, calcium  6.3 which corrects to 7.6 considering albumin  of 2.4, protein 4.3, alk phos stable at 200.  CBC with platelets stable  w/ thrombocytopenia and leukopenia, proBNP elevated to 5252. RVP panel for flu COVID and RSV pending.  Urinalysis ordered.  CXR>.mild cardiomegaly and no acute abnormality. CT head showed no acute abnormality and expected evolution of prior infarct. Patient having intermittent confusion although slowly  improving. Overall w/ poor quality of life and failure to thrive. Palliative care was consulted and after extensive family discussion they want a transition to hospice stop dialysis and [facility] is being pursued  Subjective: Seen and examined Alert awake just finished her meal. Not in distress. Patient remains on as needed IV Dilaudid , p.o. Tylenol   Assessment and plan:  End-of-life care: After extensive family meeting with palliative care patient and family has decided to transition to hospice Dialysis has been discontinued by nephrology>Waiting for bed at inpatient hospice-?  If able to go home with hospice but daughter endorse she will not be able to care for her. TOC, PMT and hospice team closely evaluating  Failure to thrive ESRD on HD MWF Vasovagal episode during dialysis 1/20: Acute toxic metabolic encephalopathy:-Likely toxic metabolic. Mentation worsening ever since her stroke, also having weakness no longer able to walk with a walker and having failure to thrive.  CT head was obtained, and neurology was curb sided New onset atrial fibrillation-Eliquis  started but held due to thrombocytopenia Severe hypothyroidism-secondary to noncompliance: History of CVA-Recent admit for MCA CVA s/pmechanical thrombectomy Seizure Disorder Thrombocytopenia, leukopenia Hyperlipidemia Type II diabetes Hypertension: Anemia of ESRD  DVT prophylaxis: NONE Code Status:   Code Status: Do not attempt resuscitation (DNR) - Comfort care Family Communication: plan of care discussed with patient/discussed with daughter at bedside 1/20.  No family at bedside today Patient status is: Remains hospitalized because of severity of illness Level of care: Telemetry   Dispo: The patient is from: home            Anticipated disposition: Planning for inpatient hospice.  Daughter unable to take care of her at home W/ HOSPICE Objective: Vitals last 24 hrs: Vitals:   10/02/24 0815 10/02/24 1613 10/02/24  1956 10/03/24 0820  BP: 132/70 123/60 115/66 118/66  Pulse: 72 92 88 85  Resp: 20 20 18 20   Temp: 98.1 F (36.7 C) (!) 97.3 F (36.3 C) 97.6 F (36.4 C) 97.7 F (36.5 C)  TempSrc:    Oral  SpO2: 99% 98% 99% 96%  Weight:      Height:       Physical Examination: General exam: Alert awake, ill looking frail HEENT:Oral mucosa moist, Ear/Nose WNL grossly Respiratory system: Bilaterally diminished  Cardiovascular system: S1 & S2 +, No JVD. Gastrointestinal system: Abdomen soft,NT,ND, BS+ Nervous System: Alert, awake, moving all extremities Extremities: extremities warm, leg edema neg Skin: Warm, no rashes, bruises on her arms MSK: Very weak muscle bulk,tone, power   Medications reviewed: Scheduled Meds:  divalproex   500 mg Oral Daily    Unresulted Labs (From admission, onward)    None      Data Reviewed: I have personally reviewed following labs and imaging studies ( see epic result tab) CBC: Recent Labs  Lab 09/29/24 1122 09/30/24 0142 10/01/24 0443 10/01/24 0919  WBC 2.3* 3.3* 2.9*  --   HGB 12.8 12.0 11.5*  --   HCT 40.2 37.2 35.0*  --   MCV 106.9* 104.5* 102.0*  --   PLT 54* 51* PLATELET CLUMPS NOTED ON SMEAR, COUNT APPEARS DECREASED PLATELET CLUMPS NOTED ON SMEAR, COUNT APPEARS DECREASED   CMP: Recent Labs  Lab 09/29/24 1319 09/30/24 0142 10/01/24 0443  NA 145 140 134*  K 3.9 4.9 3.9  CL 110 98 93*  CO2 21* 27 31  GLUCOSE 66* 83 86  BUN 51* 70* 27*  CREATININE 6.49* 9.06* 4.71*  CALCIUM  6.3* 9.0 9.0  MG 1.8  --   --    GFR: Estimated Creatinine Clearance: 6.4 mL/min (A) (by C-G formula based on SCr of 4.71 mg/dL (H)). Recent Labs  Lab 09/29/24 1319 09/30/24 0142 10/01/24 0443  AST 22 22 27   ALT 5 7 8   ALKPHOS 200* 259* 254*  BILITOT <0.2 <0.2 0.3  PROT 4.3* 5.6* 5.5*  ALBUMIN  2.4* 3.1* 3.1*   No results for input(s): LIPASE, AMYLASE in the last 168 hours.  Recent Labs  Lab 09/29/24 1949  AMMONIA 31   Coagulation Profile: No results  for input(s): INR, PROTIME in the last 168 hours. Antimicrobials/Microbiology: Anti-infectives (From admission, onward)    None         Component Value Date/Time   SDES URINE, RANDOM 09/20/2024 1550   SPECREQUEST  09/20/2024 1550    NONE Reflexed from [id number] Performed at [facility], [address] [place], [place] [address]    CULT 60,000 COLONIES/mL ENTEROCOCCUS FAECALIS (A) 09/20/2024 1550   REPTSTATUS 09/22/2024 FINAL 09/20/2024 1550    Procedures:  Mennie LAMY, MD [facility] 10/03/2024, 10:51 AM   "

## 2024-10-03 NOTE — Progress Notes (Signed)
 Respectfully, due to patient on Comfort Care Measures, Important Message Letter will be mailed to patient address on file.

## 2024-10-03 NOTE — Care Management Important Message (Signed)
 Important Message  Patient Details  Name: Norma Coleman MRN: [id number] Date of Birth: 17-Sep-1932   Important Message Given:  No     [name] 10/03/2024, 11:05 AM

## 2024-10-03 NOTE — Plan of Care (Signed)
" °  Problem: Coping: Goal: Ability to adjust to condition or change in health will improve Outcome: Progressing   Problem: Health Behavior/Discharge Planning: Goal: Ability to identify and utilize available resources and services will improve Outcome: Progressing Goal: Ability to manage health-related needs will improve Outcome: Progressing   Problem: Education: Goal: Knowledge of General Education information will improve Description: Including pain rating scale, medication(s)/side effects and non-pharmacologic comfort measures Outcome: Progressing   Problem: Health Behavior/Discharge Planning: Goal: Ability to manage health-related needs will improve Outcome: Progressing   Problem: Coping: Goal: Level of anxiety will decrease Outcome: Progressing   Problem: Pain Managment: Goal: General experience of comfort will improve and/or be controlled Outcome: Progressing   Problem: Safety: Goal: Ability to remain free from injury will improve Outcome: Progressing   Problem: Education: Goal: Knowledge of the prescribed therapeutic regimen will improve Outcome: Progressing   Problem: Coping: Goal: Ability to identify and develop effective coping behavior will improve Outcome: Progressing   Problem: Clinical Measurements: Goal: Quality of life will improve Outcome: Progressing   Problem: Respiratory: Goal: Verbalizations of increased ease of respirations will increase Outcome: Progressing   Problem: Role Relationship: Goal: Family's ability to cope with current situation will improve Outcome: Progressing Goal: Ability to verbalize concerns, feelings, and thoughts to partner or family member will improve Outcome: Progressing   Problem: Pain Management: Goal: Satisfaction with pain management regimen will improve Outcome: Progressing   "

## 2024-10-04 DIAGNOSIS — G9340 Encephalopathy, unspecified: Secondary | ICD-10-CM | POA: Diagnosis not present

## 2024-10-04 DIAGNOSIS — Z515 Encounter for palliative care: Secondary | ICD-10-CM | POA: Diagnosis not present

## 2024-10-04 NOTE — Progress Notes (Signed)
 "  Palliative Medicine Inpatient Follow Up Note   HPI: 89 year old female with past medical history of ESRD on hemodialysis for 2 years, CVA status post mechanical thrombectomy, seizure disorder, hypertension, hyperlipidemia, type 2 diabetes admitted on 09/29/2024 with acute metabolic encephalopathy.  Patient's overall function and cognition worsened since her infarct on 08/30/2024.  Of note, patient recently had UTI on 09/20/2024 and was treated with Keflex .  Per nephrology note by Lenon NP, patient had a vasovagal episode with altered mentation on 09/30/2024 during hemodialysis session which resolved after IVF and Trendelenburg position.  Palliative medicine then consulted for goals of care discussion.  This is hospital day #: 4 Patient was transitioned to comfort care pathway on 10/01/2024. Today's Discussion 10/04/2024  I have reviewed medical records including:  EPIC notes: Reviewed PMT provider  Norma Coleman progress note from 10/03/2024.  Started on scheduled Ativan  1 mg every 6 hours for anxiety and seizure prophylaxis.  Patient is on comfort care pathway.  Plan is to continue to monitor our patient for eligibility for inpatient hospice. Vital signs: Most recent vital signs from 10/04/2024 reviewed, stable MAR: Note that there are some scheduled medicines that were not administered due to patient's refusal including scheduled Ativan  dose from 1800 yesterday and 6 AM today.  Reviewed PRN meds received over the last 24 hours.  Patient received one-time dose of as needed Dilaudid .  Reviewed to assess needs for medication adjustment to optimize comfort.  Available advanced directives in ACP: MOST form and DNR form available Labs: No recent labs.  Patient is on comfort focused pathway.  Visited patient at bedside today. Daughter Norma Coleman present. Patient is on comfort care and appears chronically ill. Pleasant demeanor. No signs of acute distress or labored breathing noted. Appears peaceful, with no  grimacing or evidence of discomfort.  Reviewed plan and goals of care with the patient's daughter, Norma Coleman.  She verbalized clear understanding and expressed peace with the decision to pursue a comfort focused approach, including hospice philosophy of care when outside the hospital setting, as this aligns with the patient's wishes to prioritize quality of life for symptom management and relief of pain and discomfort.  She asked thoughtful questions regarding hospice and symptom management.  We discussed inpatient hospice eligibility, and I shared that hospice can be asked to revisit the patient in the coming days for reevaluation, or alternatively, another hospice agency may be consulted for inpatient hospice eligibility.  Norma Coleman is agreeable to this plan.  We discussed expected end-of-life symptoms and management, including the use of scheduled lorazepam .  I noted that there had been some missed doses and shared that I will update nursing to administer the medication as scheduled and avoid skipping doses unless the patient is strongly refusing.  Norma Coleman observed that the patient appears Norma Coleman and more relaxed today compared to prior days when she was more perky, when she questioned whether this change may be related to lorazepam  or progression toward end-of-life.  I shared that either report may be contributing factors and emphasized that the primary goal remains the patient's comfort, which she agreed with.  Appetite remains poor, with the patient consuming approximately only 25% of her breakfast per documentation.  Education imparted on symptom management with comfort medications at end of life.   Created space and opportunity for daughter Norma Coleman to explore thoughts feelings and fears regarding current medical situation. Emotional support and therapeutic listening offered.   Questions and concerns addressed   Palliative Support Provided.   Coordinated plan of  care with primary  RN. Reports no  significant event overnight, family at bed side. Advised to try not to skip ativan  scheduled doses.   Objective Assessment: Vital Signs Vitals:   10/03/24 2136 10/04/24 0647  BP: 107/65 (!) 102/59  Pulse: 79 78  Resp: 14 16  Temp: 97.8 F (36.6 C) 97.8 F (36.6 C)  SpO2: 94% 96%   No intake or output data in the 24 hours ending 10/04/24 0910 Last Weight  Most recent update: 09/30/2024 12:23 PM    Weight  54.2 kg (119 lb 7.8 oz)             Physical Exam Vitals and nursing note reviewed.  Constitutional:      Appearance: She is ill-appearing.     Comments: On end of life care   HENT:     Head: Normocephalic and atraumatic.  Cardiovascular:     Rate and Rhythm: Normal rate.  Pulmonary:     Comments: Clear, diminished bilaterally.  Abdominal:     General: Abdomen is flat.  Musculoskeletal:     Cervical back: Normal range of motion.     Comments: Generalized weakness.   Neurological:     General: No focal deficit present.     Mental Status: She is alert.     Comments: Pleasantly confused  Psychiatric:        Mood and Affect: Mood normal.     SUMMARY OF RECOMMENDATIONS    # Complex medical decision-making/goals of care  Code Status: Continue with DNR-Comfort Will place TOC consult to let [facility] evaluate patient for inpatient eligibility on Monday. Patient was deemed not eligible by [facility] for inpatient hospice on 1/22.  Comfort cart for family Unrestricted visitations in the setting of EOL (per policy) Palliative Medicine will continue to follow for support and symptom management.    # Symptom Management: Oxycodone /Dilaudid  as needed for pain/dyspnea/increased work of breathing/RR greater than 25 given patient's ESRD Tylenol  as needed for pain/fever Biotene as needed daily Benadryl  as needed for itching Robinul  as needed for excessive secretions Haldol  PRN for agitation/anxiety Ativan  scheduled every 6 hours for anxiety and seizure prophylaxis (Advised Nursing  to administer this medication as scheduled unless patient strongly refusing.) Zofran  as needed for nausea and vomiting MiraLAX  as needed for constipation  # Psychosocial support Continue to provide psycho-social and emotional support to patient and family.  # Discharge planning  To be reevaluated by hospice agency for inpatient hospice eligibility. # Prognosis  Patient is on comfort focused pathway  # Treatment plan discussed with:  Patient, patient's daughter Norma Coleman, nursing staff and medical team.  I personally spent a total of 35 minutes in the care of the patient today including preparing to see the patient, getting/reviewing separately obtained history, performing a medically appropriate exam/evaluation, counseling and educating, referring and communicating with other health care professionals, documenting clinical information in the EHR, and coordinating care.    Thank you for allowing us  to participate in the care of Norma Coleman   ______________________________________________________________________________________ Kathlyne Bolder NP-C [place] Palliative Medicine Team Team Cell Phone: [phone] Please utilize secure chat with additional questions, if there is no response within 30 minutes please call the above phone number  Palliative Medicine Team providers are available by phone from 7am to 7pm daily and can be reached through the team cell phone.  Should this patient require assistance outside of these hours, please call the patient's attending physician.     "

## 2024-10-04 NOTE — Progress Notes (Signed)
 " PROGRESS NOTE Norma Coleman  FMW:[id number] DOB: 1932/09/24 DOA: 09/29/2024 PCP: Corlis Pagan, NP  Brief Narrative/Hospital Course: Norma Coleman is a 89 y.o. female with PMH of hypertension, hyperlipidemia, diabetes, hypothyroidism, CVA, chronic diastolic CHF, ESRD on HD, DVT presenting with altered mental status\along with worsening weakness and confusion /lethargy/sleepiness Patient was admitted to the neurology service 12/2-12/10 after acute CVA status post mechanical thrombectomy.  Complicated by new onset seizures for which patient was started on valproic  acid.  Also had some issues with anemia for which she received 2 units of PRBC transfusion.  Was discharged to rehab on dual antiplatelet therapy with plan for 30-day heart monitor. Patient had been doing okay other than treatment for a UTI for which she was started on Keflex  in the ED on 1/10.  Followed up with neurology for 5 days ago and lab workup showed low platelets and low white count so valproic  acid dose was adjusted and plan for repeat labs in a week and a half from now. (Family has not gotten the message for dose adjustment and have been continuing at the original dose.) Patient has developed worsening weakness, confusion, lethargy for the past week or so missed dialysis on Friday due to diarrhea and again 1/19 due to generalized weakness.  She can no longer walk with a walker.  She also has missed some doses of thyroid  medication In ED: Vital signs stable. bicarb 21, BUN 51, creatinine 6.49, glucose 66, calcium  6.3 which corrects to 7.6 considering albumin  of 2.4, protein 4.3, alk phos stable at 200.  CBC with platelets stable  w/ thrombocytopenia and leukopenia, proBNP elevated to 5252. RVP panel for flu COVID and RSV pending.  Urinalysis ordered.  CXR>.mild cardiomegaly and no acute abnormality. CT head showed no acute abnormality and expected evolution of prior infarct. Patient having intermittent confusion although slowly  improving. Overall w/ poor quality of life and failure to thrive. Palliative care was consulted and after extensive family discussion they want a transition to hospice stop dialysis and [facility] is being pursued  Subjective: Seen and examined Appears bit lethargic and sleepy on bedpan. Normal anxiety after getting Ativan . Daughter at the bedside Overnight remains afebrile BP stable, has been needing IV Dilaudid  and also getting Ativan   Assessment and plan:  End-of-life care: After extensive family meeting with palliative care patient and family has decided to transition to hospice Dialysis has been discontinued by nephrology>Waiting for bed at inpatient hospice-?  If able to go home with hospice but daughter endorse she will not be able to care for her. TOC, PMT and hospice team closely evaluating  Failure to thrive ESRD on HD MWF Vasovagal episode during dialysis 1/20: Acute toxic metabolic encephalopathy:-Likely toxic metabolic. Mentation worsening ever since her stroke, also having weakness no longer able to walk with a walker and having failure to thrive.  CT head was obtained, and neurology was curb sided New onset atrial fibrillation-Eliquis  started but held due to thrombocytopenia Severe hypothyroidism-secondary to noncompliance: History of CVA-Recent admit for MCA CVA s/pmechanical thrombectomy Seizure Disorder Thrombocytopenia, leukopenia Hyperlipidemia Type II diabetes Hypertension: Anemia of ESRD  DVT prophylaxis: NONE Code Status:   Code Status: Do not attempt resuscitation (DNR) - Comfort care Family Communication: plan of care discussed with patient/discussed with her younger daughter at the bedside Patient status is: Remains hospitalized because of severity of illness Level of care: Palliative Care   Dispo: The patient is from: home  Anticipated disposition: Planning for inpatient hospice.  Daughter unable to take care of her at home W/  HOSPICE Objective: Vitals last 24 hrs: Vitals:   10/03/24 0820 10/03/24 1946 10/03/24 2136 10/04/24 0647  BP: 118/66 105/60 107/65 (!) 102/59  Pulse: 85 74 79 78  Resp: 20 16 14 16   Temp: 97.7 F (36.5 C) 98.6 F (37 C) 97.8 F (36.6 C) 97.8 F (36.6 C)  TempSrc: Oral  Oral   SpO2: 96% 99% 94% 96%  Weight:      Height:       Physical Examination: General exam: Thin frail ill-appearing, sleeping Rest of exam deferred for comfort  Medications reviewed: Scheduled Meds:  divalproex   500 mg Oral Daily   LORazepam   1 mg Oral Q6H   Or   LORazepam   1 mg Intravenous Q6H    Unresulted Labs (From admission, onward)    None      Data Reviewed: I have personally reviewed following labs and imaging studies ( see epic result tab) CBC: Recent Labs  Lab 09/29/24 1122 09/30/24 0142 10/01/24 0443 10/01/24 0919  WBC 2.3* 3.3* 2.9*  --   HGB 12.8 12.0 11.5*  --   HCT 40.2 37.2 35.0*  --   MCV 106.9* 104.5* 102.0*  --   PLT 54* 51* PLATELET CLUMPS NOTED ON SMEAR, COUNT APPEARS DECREASED PLATELET CLUMPS NOTED ON SMEAR, COUNT APPEARS DECREASED   CMP: Recent Labs  Lab 09/29/24 1319 09/30/24 0142 10/01/24 0443  NA 145 140 134*  K 3.9 4.9 3.9  CL 110 98 93*  CO2 21* 27 31  GLUCOSE 66* 83 86  BUN 51* 70* 27*  CREATININE 6.49* 9.06* 4.71*  CALCIUM  6.3* 9.0 9.0  MG 1.8  --   --    GFR: Estimated Creatinine Clearance: 6.4 mL/min (A) (by C-G formula based on SCr of 4.71 mg/dL (H)). Recent Labs  Lab 09/29/24 1319 09/30/24 0142 10/01/24 0443  AST 22 22 27   ALT 5 7 8   ALKPHOS 200* 259* 254*  BILITOT <0.2 <0.2 0.3  PROT 4.3* 5.6* 5.5*  ALBUMIN  2.4* 3.1* 3.1*   No results for input(s): LIPASE, AMYLASE in the last 168 hours.  Recent Labs  Lab 09/29/24 1949  AMMONIA 31   Coagulation Profile: No results for input(s): INR, PROTIME in the last 168 hours. Antimicrobials/Microbiology: Anti-infectives (From admission, onward)    None         Component Value  Date/Time   SDES URINE, RANDOM 09/20/2024 1550   SPECREQUEST  09/20/2024 1550    NONE Reflexed from [id number] Performed at [facility], [address], [place], [place] [address]    CULT 60,000 COLONIES/mL ENTEROCOCCUS FAECALIS (A) 09/20/2024 1550   REPTSTATUS 09/22/2024 FINAL 09/20/2024 1550    Procedures:  Mennie LAMY, MD [facility] 10/04/2024, 11:20 AM   "

## 2024-10-04 NOTE — Plan of Care (Signed)
  Problem: Education: Goal: Ability to describe self-care measures that may prevent or decrease complications (Diabetes Survival Skills Education) will improve Outcome: Progressing   Problem: Coping: Goal: Ability to adjust to condition or change in health will improve Outcome: Progressing   Problem: Skin Integrity: Goal: Risk for impaired skin integrity will decrease Outcome: Progressing   Problem: Safety: Goal: Ability to remain free from injury will improve Outcome: Progressing

## 2024-10-05 DIAGNOSIS — G9340 Encephalopathy, unspecified: Secondary | ICD-10-CM | POA: Diagnosis not present

## 2024-10-05 NOTE — Care Management (Signed)
 SDOH resources added to AVS

## 2024-10-05 NOTE — Progress Notes (Signed)
 " PROGRESS NOTE Norma Coleman  FMW:[id number] DOB: [birth year] DOA: 09/29/2024 PCP: Corlis Pagan, NP  Brief Narrative/Hospital Course: Norma Coleman is a 89 y.o. female with PMH of hypertension, hyperlipidemia, diabetes, hypothyroidism, CVA, chronic diastolic CHF, ESRD on HD, DVT presenting with altered mental status\along with worsening weakness and confusion /lethargy/sleepiness Patient was admitted to the neurology service 12/2-12/10 after acute CVA status post mechanical thrombectomy.  Complicated by new onset seizures for which patient was started on valproic  acid.  Also had some issues with anemia for which she received 2 units of PRBC transfusion.  Was discharged to rehab on dual antiplatelet therapy with plan for 30-day heart monitor. Patient had been doing okay other than treatment for a UTI for which she was started on Keflex  in the ED on 1/10.  Followed up with neurology for 5 days ago and lab workup showed low platelets and low white count so valproic  acid dose was adjusted and plan for repeat labs in a week and a half from now. (Family has not gotten the message for dose adjustment and have been continuing at the original dose.) Patient has developed worsening weakness, confusion, lethargy for the past week or so missed dialysis on Friday due to diarrhea and again 1/19 due to generalized weakness.  She can no longer walk with a walker.  She also has missed some doses of thyroid  medication In ED: Vital signs stable. bicarb 21, BUN 51, creatinine 6.49, glucose 66, calcium  6.3 which corrects to 7.6 considering albumin  of 2.4, protein 4.3, alk phos stable at 200.  CBC with platelets stable  w/ thrombocytopenia and leukopenia, proBNP elevated to 5252. RVP panel for flu COVID and RSV pending.  Urinalysis ordered.  CXR>.mild cardiomegaly and no acute abnormality. CT head showed no acute abnormality and expected evolution of prior infarct. Patient having intermittent confusion although slowly  improving. Overall w/ poor quality of life and failure to thrive. Palliative care was consulted and after extensive family discussion they want a transition to hospice stop dialysis and [facility] is being pursued  Subjective: Seen and examined Sleeping- woke up on calling ia ma ok Overnight events afebrile Daughter not at bedside today  Assessment and plan:  End-of-life care: After extensive family meeting with palliative care patient and family has decided to transition to hospice Dialysis discontinued by nephrology>Waiting for bed at inpatient hospice.  Unable to go home with hospice as daughter not be able to manage at home. TOC, PMT and hospice team following  Failure to thrive ESRD on HD MWF Vasovagal episode during dialysis 1/20: Acute toxic metabolic encephalopathy:-Likely toxic metabolic. Mentation worsening ever since her stroke, also having weakness no longer able to walk with a walker and having failure to thrive.  CT head was obtained, and neurology was curb sided New onset atrial fibrillation-Eliquis  started but held due to thrombocytopenia Severe hypothyroidism-secondary to noncompliance: History of CVA-Recent admit for MCA CVA s/pmechanical thrombectomy Seizure Disorder Thrombocytopenia, leukopenia Hyperlipidemia Type II diabetes Hypertension: Anemia of ESRD  DVT prophylaxis: NONE Code Status:   Code Status: Do not attempt resuscitation (DNR) - Comfort care Family Communication: plan of care discussed with patient/discussed with her younger daughter at the bedside Patient status is: Remains hospitalized because of severity of illness Level of care: Palliative Care   Dispo: The patient is from: home            Anticipated disposition: Planning for inpatient hospice.  Daughter unable to take care of her at home W/ HOSPICE Objective:  Vitals last 24 hrs: Vitals:   10/03/24 1946 10/03/24 2136 10/04/24 0647 10/05/24 0626  BP: 105/60 107/65 (!) 102/59 133/78   Pulse: 74 79 78 85  Resp: 16 14 16 14   Temp: 98.6 F (37 C) 97.8 F (36.6 C) 97.8 F (36.6 C) 98 F (36.7 C)  TempSrc:  Oral  Oral  SpO2: 99% 94% 96% 94%  Weight:      Height:       Physical Examination: General exam: alert awake elderly frail looking HEENT:Oral mucosa moist, Ear/Nose WNL grossly Respiratory system: Bilaterally clear Cardiovascular system: S1 & S2 +, No JVD. Gastrointestinal system: Abdomen soft Nervous System: Alert, awake Extremities: LE edema neg Skin: No rashes,no icterus. MSK: Weak, frail  Medications reviewed: Scheduled Meds:  divalproex   500 mg Oral Daily   LORazepam   1 mg Oral Q6H   Or   LORazepam   1 mg Intravenous Q6H    Unresulted Labs (From admission, onward)    None      Data Reviewed: I have personally reviewed following labs and imaging studies ( see epic result tab) CBC: Recent Labs  Lab 09/29/24 1122 09/30/24 0142 10/01/24 0443 10/01/24 0919  WBC 2.3* 3.3* 2.9*  --   HGB 12.8 12.0 11.5*  --   HCT 40.2 37.2 35.0*  --   MCV 106.9* 104.5* 102.0*  --   PLT 54* 51* PLATELET CLUMPS NOTED ON SMEAR, COUNT APPEARS DECREASED PLATELET CLUMPS NOTED ON SMEAR, COUNT APPEARS DECREASED   CMP: Recent Labs  Lab 09/29/24 1319 09/30/24 0142 10/01/24 0443  NA 145 140 134*  K 3.9 4.9 3.9  CL 110 98 93*  CO2 21* 27 31  GLUCOSE 66* 83 86  BUN 51* 70* 27*  CREATININE 6.49* 9.06* 4.71*  CALCIUM  6.3* 9.0 9.0  MG 1.8  --   --    GFR: Estimated Creatinine Clearance: 6.4 mL/min (A) (by C-G formula based on SCr of 4.71 mg/dL (H)). Recent Labs  Lab 09/29/24 1319 09/30/24 0142 10/01/24 0443  AST 22 22 27   ALT 5 7 8   ALKPHOS 200* 259* 254*  BILITOT <0.2 <0.2 0.3  PROT 4.3* 5.6* 5.5*  ALBUMIN  2.4* 3.1* 3.1*   No results for input(s): LIPASE, AMYLASE in the last 168 hours.  Recent Labs  Lab 09/29/24 1949  AMMONIA 31   Coagulation Profile: No results for input(s): INR, PROTIME in the last 168  hours. Antimicrobials/Microbiology: Anti-infectives (From admission, onward)    None         Component Value Date/Time   SDES URINE, RANDOM 09/20/2024 1550   SPECREQUEST  09/20/2024 1550    NONE Reflexed from [id number] Performed at [facility], [address], [place], [place] [address]    CULT 60,000 COLONIES/mL ENTEROCOCCUS FAECALIS (A) 09/20/2024 1550   REPTSTATUS 09/22/2024 FINAL 09/20/2024 1550    Procedures:  Mennie LAMY, MD [facility] 10/05/2024, 10:19 AM   "

## 2024-10-05 NOTE — Discharge Instructions (Signed)

## 2024-10-05 NOTE — Progress Notes (Signed)
 "  Palliative Medicine Inpatient Follow Up Note   HPI:89 year old female with past medical history of ESRD on hemodialysis for 2 years, CVA status post mechanical thrombectomy, seizure disorder, hypertension, hyperlipidemia, type 2 diabetes admitted on 09/29/2024 with acute metabolic encephalopathy.  Patient's overall function and cognition worsened since her infarct on 08/30/2024.  Of note, patient recently had UTI on 09/20/2024 and was treated with Keflex .  Per nephrology note by Lenon NP, patient had a vasovagal episode with altered mentation on 09/30/2024 during hemodialysis session which resolved after IVF and Trendelenburg position.  Palliative medicine then consulted for goals of care discussion.   Patient was transitioned to comfort care pathway on 10/01/2024.  This is hospital day #: 5 Today's Discussion 10/05/2024  I have reviewed medical records including:  EPIC notes: Reviewed hospitalist progress note from 10/05/2024. Patient is being followed by PMT for comfort pathway/hospice.  Vital signs: Reviewed from 10/05/2024 at 0 6:26 AM: Temperature 32F, blood pressure 133/78 mmHg, pulse rate 85, respiration 14, saturation 94% on room air. MAR: Reviewed PRN meds received over the last 24 hours. No PRN comfort meds received. Patient received all her scheduled Ativan  doses. Reviewed to assess needs for medication adjustment to optimize comfort.  Available advanced directives in ACP: Patient has pre-existing MOST and DNR form Labs: No recent lab works.  Patient is on comfort focused pathway  Visited patient at bedside. Patient appears comfortable. She is comfortably sleeping. No non-verbal signs of pain or discomfort noted. Respirations are even and unlabored. No excessive respiratory secretions noted. No family present at bedside currently.  Left undisturbed to optimize comfort.   Goals of care unchanged. Continue with DNR-comfort. Plan for [facility] to evaluate patient for  inpatient hospice eligibility on Monday.    Coordinated plan of care with Primary RN. No acute events overnight. No family at bedside currently. Family plans to be here in the afternoon.   Palliative Support Provided.   Objective Assessment: Vital Signs Vitals:   10/04/24 0647 10/05/24 0626  BP: (!) 102/59 133/78  Pulse: 78 85  Resp: 16 14  Temp: 97.8 F (36.6 C) 98 F (36.7 C)  SpO2: 96% 94%    Intake/Output Summary (Last 24 hours) at 10/05/2024 1158 Last data filed at 10/04/2024 1300 Gross per 24 hour  Intake 120 ml  Output 150 ml  Net -30 ml   Last Weight  Most recent update: 09/30/2024 12:23 PM    Weight  54.2 kg (119 lb 7.8 oz)             Physical Exam Vitals and nursing note reviewed.  Constitutional:      Appearance: She is ill-appearing.     Comments: Asleep  HENT:     Head: Normocephalic and atraumatic.  Cardiovascular:     Rate and Rhythm: Normal rate.     Comments: Left upper arm AV fistula Pulmonary:     Effort: Pulmonary effort is normal.     Comments: Clear, diminished bilaterally Abdominal:     General: Abdomen is flat.     Palpations: Abdomen is soft.  Musculoskeletal:     Comments: Generalized weakness  Skin:    General: Skin is warm and dry.  Neurological:     Comments: Unable to assess  Psychiatric:     Comments: Unable to assess     SUMMARY OF RECOMMENDATIONS    # Complex medical decision-making/goals of care  Code Status: Maintain DNR comfort TOC consult placed (10/04/2024) to assist with letting  patient be evaluated for inpatient hospice eligibility by [facility] on Monday.  Comfort cart for family Unrestricted visitations in the setting of end-of-life Palliative medicine team will continue to follow for support and symptom management Comfort cart for the family   # Symptom Management:  Oxycodone /Dilaudid  as needed for pain/dyspnea/air hunger/discomfort Tylenol  as needed for pain/fever Biotin as needed  daily Benadryl  as needed for itching Robinul  as needed for excessive oral secretions Haldol  as needed for agitation/anxiety Continue with Ativan  scheduled every 6 hours for anxiety and seizure prophylaxis.  Advised nursing to avoid skipping doses, and to administer this medication as scheduled unless patient is strongly refusing.   Zofran  as needed for nausea/vomiting MiraLAX  as needed for constipation  # Psychosocial support Continue to provide psycho-social and emotional support to patient and family.  # Discharge planning  To be determined.  Continue to seek for inpatient hospice eligibility.  # Prognosis  Prognosis is poor.  Patient is on a comfort focused pathway   # Treatment plan discussed with: Nursing staff  I personally spent a total of 25 minutes in the care of the patient today including preparing to see the patient, getting/reviewing separately obtained history, performing a medically appropriate exam/evaluation, referring and communicating with other health care professionals, documenting clinical information in the EHR, and coordinating care.    Thank you for allowing us  to participate in the care of Norma Coleman   ______________________________________________________________________________________ Kathlyne Bolder NP-C [facility] Palliative Medicine Team Team Cell Phone: [phone] Please utilize secure chat with additional questions, if there is no response within 30 minutes please call the above phone number  Palliative Medicine Team providers are available by phone from 7am to 7pm daily and can be reached through the team cell phone.  Should this patient require assistance outside of these hours, please call the patient's attending physician.     "

## 2024-10-06 DIAGNOSIS — G9340 Encephalopathy, unspecified: Secondary | ICD-10-CM | POA: Diagnosis not present

## 2024-10-06 NOTE — Progress Notes (Signed)
 " PROGRESS NOTE Norma Coleman  FMW:[id number] DOB: 05/30/1933 DOA: 09/29/2024 PCP: Corlis Pagan, NP  Brief Narrative/Hospital Course: Norma Coleman is a 89 y.o. female with PMH of hypertension, hyperlipidemia, diabetes, hypothyroidism, CVA, chronic diastolic CHF, ESRD on HD, DVT presenting with altered mental status\along with worsening weakness and confusion /lethargy/sleepiness Patient was admitted to the neurology service 12/2-12/10 after acute CVA status post mechanical thrombectomy.  Complicated by new onset seizures for which patient was started on valproic  acid.  Also had some issues with anemia for which she received 2 units of PRBC transfusion.  Was discharged to rehab on dual antiplatelet therapy with plan for 30-day heart monitor. Patient had been doing okay other than treatment for a UTI for which she was started on Keflex  in the ED on 1/10.  Followed up with neurology for 5 days ago and lab workup showed low platelets and low white count so valproic  acid dose was adjusted and plan for repeat labs in a week and a half from now. (Family has not gotten the message for dose adjustment and have been continuing at the original dose.) Patient has developed worsening weakness, confusion, lethargy for the past week or so missed dialysis on Friday due to diarrhea and again 1/19 due to generalized weakness.  She can no longer walk with a walker.  She also has missed some doses of thyroid  medication In ED: Vital signs stable. bicarb 21, BUN 51, creatinine 6.49, glucose 66, calcium  6.3 which corrects to 7.6 considering albumin  of 2.4, protein 4.3, alk phos stable at 200.  CBC with platelets stable  w/ thrombocytopenia and leukopenia, proBNP elevated to 5252. RVP panel for flu COVID and RSV pending.  Urinalysis ordered.  CXR>.mild cardiomegaly and no acute abnormality. CT head showed no acute abnormality and expected evolution of prior infarct. Patient having intermittent confusion although slowly  improving. Overall w/ poor quality of life and failure to thrive. Palliative care was consulted and after extensive family discussion they want a transition to hospice stop dialysis and [facility] is being pursued  Subjective: Seen and examined Sleeping, woke up on calling, complains of feeling cold Ill looking frail  Assessment and plan:  End-of-life care: family meeting held with palliative team and decided to transition to hospice/EOL, HD discontinued Awaiting for evaluation for inpatient hospice facility. Unable to go home with hospice as daughter not be able to manage at home. TOC, PMT and hospice team following  Failure to thrive ESRD on HD MWF Vasovagal episode during dialysis 1/20: Acute toxic metabolic encephalopathy:-Likely toxic metabolic. Mentation worsening ever since her stroke, also having weakness no longer able to walk with a walker and having failure to thrive.  CT head was obtained, and neurology was curb sided New onset atrial fibrillation-Eliquis  started but held due to thrombocytopenia Severe hypothyroidism-secondary to noncompliance: History of CVA-Recent admit for MCA CVA s/pmechanical thrombectomy Seizure Disorder Thrombocytopenia, leukopenia Hyperlipidemia Type II diabetes Hypertension: Anemia of ESRD  DVT prophylaxis: NONE Code Status:   Code Status: Do not attempt resuscitation (DNR) - Comfort care Family Communication: plan of care discussed with patient/discussed with her younger daughter at the bedside Patient status is: Remains hospitalized because of severity of illness Level of care: Palliative Care   Dispo: The patient is from: home            Anticipated disposition: Planning for inpatient hospice.  Daughter unable to take care of her at home W/ HOSPICE Objective: Vitals last 24 hrs: Vitals:   10/03/24 2136  10/04/24 0647 10/05/24 0626 10/06/24 0549  BP: 107/65 (!) 102/59 133/78 (!) 148/69  Pulse: 79 78 85 68  Resp: 14 16 14 16   Temp:  97.8 F (36.6 C) 97.8 F (36.6 C) 98 F (36.7 C) 97.9 F (36.6 C)  TempSrc: Oral  Oral Oral  SpO2: 94% 96% 94% 98%  Weight:      Height:       Physical Examination: General exam: alert awake, elderly and ill-appearing HEENT:Oral mucosa moist, Respiratory system: Bilaterally clear Cardiovascular system: S1 & S2 +, No JVD. Gastrointestinal system: Abdomen soft Nervous System: Alert, awake Extremities: LE edema neg Skin: No rashes,no icterus. MSK: Weak, frail  Medications reviewed: Scheduled Meds:  divalproex   500 mg Oral Daily   LORazepam   1 mg Oral Q6H   Or   LORazepam   1 mg Intravenous Q6H    Unresulted Labs (From admission, onward)    None      Data Reviewed: I have personally reviewed following labs and imaging studies ( see epic result tab) CBC: Recent Labs  Lab 09/29/24 1122 09/30/24 0142 10/01/24 0443 10/01/24 0919  WBC 2.3* 3.3* 2.9*  --   HGB 12.8 12.0 11.5*  --   HCT 40.2 37.2 35.0*  --   MCV 106.9* 104.5* 102.0*  --   PLT 54* 51* PLATELET CLUMPS NOTED ON SMEAR, COUNT APPEARS DECREASED PLATELET CLUMPS NOTED ON SMEAR, COUNT APPEARS DECREASED   CMP: Recent Labs  Lab 09/29/24 1319 09/30/24 0142 10/01/24 0443  NA 145 140 134*  K 3.9 4.9 3.9  CL 110 98 93*  CO2 21* 27 31  GLUCOSE 66* 83 86  BUN 51* 70* 27*  CREATININE 6.49* 9.06* 4.71*  CALCIUM  6.3* 9.0 9.0  MG 1.8  --   --    GFR: Estimated Creatinine Clearance: 6.4 mL/min (A) (by C-G formula based on SCr of 4.71 mg/dL (H)). Recent Labs  Lab 09/29/24 1319 09/30/24 0142 10/01/24 0443  AST 22 22 27   ALT 5 7 8   ALKPHOS 200* 259* 254*  BILITOT <0.2 <0.2 0.3  PROT 4.3* 5.6* 5.5*  ALBUMIN  2.4* 3.1* 3.1*   No results for input(s): LIPASE, AMYLASE in the last 168 hours.  Recent Labs  Lab 09/29/24 1949  AMMONIA 31   Coagulation Profile: No results for input(s): INR, PROTIME in the last 168 hours. Antimicrobials/Microbiology: Anti-infectives (From admission, onward)    None          Component Value Date/Time   SDES URINE, RANDOM 09/20/2024 1550   SPECREQUEST  09/20/2024 1550    NONE Reflexed from [id number] Performed at [facility], [address] [place], [place] [address]    CULT 60,000 COLONIES/mL ENTEROCOCCUS FAECALIS (A) 09/20/2024 1550   REPTSTATUS 09/22/2024 FINAL 09/20/2024 1550    Procedures:    Mennie LAMY, MD [facility] 10/06/2024, 10:19 AM   "

## 2024-10-06 NOTE — Plan of Care (Signed)
" °  Problem: Pain Managment: Goal: General experience of comfort will improve and/or be controlled Outcome: Progressing   Problem: Safety: Goal: Ability to remain free from injury will improve Outcome: Progressing   Problem: Pain Management: Goal: Satisfaction with pain management regimen will improve Outcome: Progressing   Problem: Education: Goal: Ability to describe self-care measures that may prevent or decrease complications (Diabetes Survival Skills Education) will improve Outcome: Not Progressing   Problem: Coping: Goal: Ability to adjust to condition or change in health will improve Outcome: Not Progressing   Problem: Health Behavior/Discharge Planning: Goal: Ability to manage health-related needs will improve Outcome: Not Progressing   Problem: Skin Integrity: Goal: Risk for impaired skin integrity will decrease Outcome: Not Progressing   Problem: Nutrition: Goal: Adequate nutrition will be maintained Outcome: Not Progressing   "

## 2024-10-06 NOTE — Plan of Care (Signed)
  Problem: Education: Goal: Ability to describe self-care measures that may prevent or decrease complications (Diabetes Survival Skills Education) will improve Outcome: Progressing   Problem: Coping: Goal: Ability to adjust to condition or change in health will improve Outcome: Progressing   Problem: Nutritional: Goal: Maintenance of adequate nutrition will improve Outcome: Progressing

## 2024-10-06 NOTE — Progress Notes (Signed)
 Palliative:  HPI: 89 year old female with past medical history of ESRD on hemodialysis for 2 years, CVA status post mechanical thrombectomy, seizure disorder, hypertension, hyperlipidemia, type 2 diabetes admitted on 09/29/2024 with acute metabolic encephalopathy.  Patient's overall function and cognition worsened since her infarct on 08/30/2024.  Of note, patient recently had UTI on 09/20/2024 and was treated with Keflex .  Per nephrology note by Lenon NP, patient had a vasovagal episode with altered mentation on 09/30/2024 during hemodialysis session which resolved after IVF and Trendelenburg position.  Palliative medicine then consulted for goals of care discussion. Patient was transitioned to comfort care pathway on 10/01/2024.   I reviewed records and met at Ms. Gries's bedside. Daughter, Silvano, is at bedside. Ms. Cerros was having back pain but was recently repositioned and is now resting peacefully. I spent time talking with [facility]. Ms. Massoud has been able to take depakote  pill but struggled more this morning - she was ultimately able to get this down in applesauce and with much encouragement. We spent time discussing this is expected and she may not be able to take this again soon. We discussed use of Ativan  for seizure prevention. We discussed expectations at end of life, signs/symptoms of progression, as well as symptom management. We discussed benefits of hospice and how palliative team meets these needs when still hospitalized.   Update: I received call from [facility] who expresses concern with BM management and protecting her mother's dignity. We brainstormed ideas on barrier to BM to manage and control better. I discussed with RN recommendation for utilizing chux wrapped like adult pamper, purchasing adult pampers to use, and mesh underwear with pads.   All questions/concerns addressed. Emotional support provided.   Exam: Sleeping peacefully after reposition - I did not disturb. Breathing regular,  unlabored. Abd flat. Warm to touch.   Plan: - DNR - Full comfort care  25 min  Bernarda Kitty, NP Palliative Medicine Team Pager [phone] (Please see [email or web address] for schedule) Team Phone [phone]

## 2024-10-07 DIAGNOSIS — G9340 Encephalopathy, unspecified: Secondary | ICD-10-CM | POA: Diagnosis not present

## 2024-10-07 MED ORDER — SCOPOLAMINE 1 MG/3DAYS TD PT72: 1.0000 | MEDICATED_PATCH | TRANSDERMAL | Status: DC | PRN

## 2024-10-07 MED ORDER — LORAZEPAM 2 MG/ML PO CONC: 1.0000 mg | ORAL | Status: DC | PRN

## 2024-10-07 MED ORDER — OXYCODONE HCL 20 MG/ML PO CONC
5.0000 mg | ORAL | Status: DC | PRN
  Administered 2024-10-07: 10 mg via SUBLINGUAL
  Filled 2024-10-07: qty 0.5

## 2024-10-07 MED ORDER — LORAZEPAM 2 MG/ML PO CONC: ORAL | Status: AC

## 2024-10-07 MED ORDER — BISACODYL 10 MG RE SUPP: 10.0000 mg | Freq: Every day | RECTAL | Status: DC | PRN

## 2024-10-07 MED ORDER — HYDROMORPHONE HCL 1 MG/ML PO LIQD: 0.5000 mg | ORAL | Status: DC | PRN

## 2024-10-07 MED ORDER — HYDROMORPHONE HCL 1 MG/ML PO LIQD: 0.5000 mg | ORAL | Status: AC | PRN

## 2024-10-07 MED ORDER — LORAZEPAM 2 MG/ML PO CONC
1.0000 mg | ORAL | Status: DC
  Administered 2024-10-07 (×3): 1 mg via SUBLINGUAL
  Filled 2024-10-07 (×3): qty 1

## 2024-10-07 MED ORDER — HALOPERIDOL 0.5 MG PO TABS: 0.5000 mg | ORAL_TABLET | ORAL | Status: AC | PRN

## 2024-10-07 MED ORDER — SCOPOLAMINE 1 MG/3DAYS TD PT72: 1.0000 | MEDICATED_PATCH | TRANSDERMAL | Status: AC | PRN

## 2024-10-07 NOTE — Plan of Care (Signed)

## 2024-10-07 NOTE — Progress Notes (Signed)
 " PROGRESS NOTE Norma Coleman  FMW:[id number] DOB: [birth year] DOA: 09/29/2024 PCP: Corlis Pagan, NP  Brief Narrative/Hospital Course: Norma Coleman is a 89 y.o. female with PMH of hypertension, hyperlipidemia, diabetes, hypothyroidism, CVA, chronic diastolic CHF, ESRD on HD, DVT presenting with altered mental status\along with worsening weakness and confusion /lethargy/sleepiness Patient was admitted to the neurology service 12/2-12/10 after acute CVA status post mechanical thrombectomy.  Complicated by new onset seizures for which patient was started on valproic  acid.  Also had some issues with anemia for which she received 2 units of PRBC transfusion.  Was discharged to rehab on dual antiplatelet therapy with plan for 30-day heart monitor. Patient had been doing okay other than treatment for a UTI for which she was started on Keflex  in the ED on 1/10.  Followed up with neurology for 5 days ago and lab workup showed low platelets and low white count so valproic  acid dose was adjusted and plan for repeat labs in a week and a half from now. (Family has not gotten the message for dose adjustment and have been continuing at the original dose.) Patient has developed worsening weakness, confusion, lethargy for the past week or so missed dialysis on Friday due to diarrhea and again 1/19 due to generalized weakness.  She can no longer walk with a walker.  She also has missed some doses of thyroid  medication In ED: Vital signs stable. bicarb 21, BUN 51, creatinine 6.49, glucose 66, calcium  6.3 which corrects to 7.6 considering albumin  of 2.4, protein 4.3, alk phos stable at 200.  CBC with platelets stable  w/ thrombocytopenia and leukopenia, proBNP elevated to 5252. RVP panel for flu COVID and RSV pending.  Urinalysis ordered.  CXR>.mild cardiomegaly and no acute abnormality. CT head showed no acute abnormality and expected evolution of prior infarct. Patient having intermittent confusion although slowly  improving. Overall w/ poor quality of life and failure to thrive. Palliative care was consulted and after extensive family discussion they want a transition to hospice stop dialysis and [facility] is being pursued  Subjective: Seen and examined Patient with daughter at the bedside patient complains of feeling cold, low appetite not much eating Appears weak frail. Yesterday morning with pain, has been getting p.o. oxy and Ativan  Sleeping, woke up on calling, complains of feeling cold Ill looking frail  Assessment and plan:  End-of-life care: family meeting held with palliative team and decided to transition to hospice/EOL, HD discontinued Awaiting for evaluation for inpatient hospice facility as she is unable to go home with hospice as daughter not be able to manage at home. TOC, PMT and hospice team following  Failure to thrive ESRD on HD MWF Vasovagal episode during dialysis 1/20: Acute toxic metabolic encephalopathy:-Likely toxic metabolic. Mentation worsening ever since her stroke, also having weakness no longer able to walk with a walker and having failure to thrive.  CT head was obtained, and neurology was curb sided New onset atrial fibrillation-Eliquis  started but held due to thrombocytopenia Severe hypothyroidism-secondary to noncompliance: History of CVA-Recent admit for MCA CVA s/pmechanical thrombectomy Seizure Disorder Thrombocytopenia, leukopenia Hyperlipidemia Type II diabetes Hypertension: Anemia of ESRD  DVT prophylaxis: NONE Code Status:   Code Status: Do not attempt resuscitation (DNR) - Comfort care Family Communication: plan of care discussed with patient/daughter updated at bedside  Patient status is: Remains hospitalized because of severity of illness Level of care: Palliative Care   Dispo: The patient is from: home  Anticipated disposition: Planning for inpatient hospice.  Daughter unable to take care of her at home W/  HOSPICE Objective: Vitals last 24 hrs: Vitals:   10/06/24 0549 10/06/24 2000 10/07/24 0429 10/07/24 0925  BP: (!) 148/69 (!) 142/68 114/74 129/85  Pulse: 68 67 77 97  Resp: 16 16 14 15   Temp: 97.9 F (36.6 C) 98.1 F (36.7 C) 97.8 F (36.6 C) 97.9 F (36.6 C)  TempSrc: Oral Oral Oral Oral  SpO2: 98%  99% 99%  Weight:      Height:       Physical Examination: General exam: Alert awake but ill-appearing frail. HEENT:Oral mucosa moist. Respiratory system: Bilaterally clear. Cardiovascular system: S1 & S2 +, No JVD. Gastrointestinal system: Abdomen soft. Nervous System: Alert, awake. Extremities: LE edema neg Skin: No rashes,no icterus. MSK: Weak, frail, NAD.  Medications reviewed: Scheduled Meds:  divalproex   500 mg Oral Daily   LORazepam   1 mg Oral Q6H   Or   LORazepam   1 mg Intravenous Q6H    Unresulted Labs (From admission, onward)    None      Data Reviewed: I have personally reviewed following labs and imaging studies ( see epic result tab) CBC: Recent Labs  Lab 10/01/24 0443 10/01/24 0919  WBC 2.9*  --   HGB 11.5*  --   HCT 35.0*  --   MCV 102.0*  --   PLT PLATELET CLUMPS NOTED ON SMEAR, COUNT APPEARS DECREASED PLATELET CLUMPS NOTED ON SMEAR, COUNT APPEARS DECREASED   CMP: Recent Labs  Lab 10/01/24 0443  NA 134*  K 3.9  CL 93*  CO2 31  GLUCOSE 86  BUN 27*  CREATININE 4.71*  CALCIUM  9.0   GFR: Estimated Creatinine Clearance: 6.4 mL/min (A) (by C-G formula based on SCr of 4.71 mg/dL (H)). Recent Labs  Lab 10/01/24 0443  AST 27  ALT 8  ALKPHOS 254*  BILITOT 0.3  PROT 5.5*  ALBUMIN  3.1*   No results for input(s): LIPASE, AMYLASE in the last 168 hours.  No results for input(s): AMMONIA in the last 168 hours.  Coagulation Profile: No results for input(s): INR, PROTIME in the last 168 hours. Antimicrobials/Microbiology: Anti-infectives (From admission, onward)    None         Component Value Date/Time   SDES URINE, RANDOM  09/20/2024 1550   SPECREQUEST  09/20/2024 1550    NONE Reflexed from [id number] Performed at [facility], [address] [place], [place] [address]    CULT 60,000 COLONIES/mL ENTEROCOCCUS FAECALIS (A) 09/20/2024 1550   REPTSTATUS 09/22/2024 FINAL 09/20/2024 1550    Procedures:  Mennie LAMY, MD [facility] 10/07/2024, 10:34 AM   "

## 2024-10-07 NOTE — Progress Notes (Incomplete)
 Palliative:  HPI: 89 year old female with past medical history of ESRD on hemodialysis for 2 years, CVA status post mechanical thrombectomy, seizure disorder, hypertension, hyperlipidemia, type 2 diabetes admitted on 09/29/2024 with acute metabolic encephalopathy.  Patient's overall function and cognition worsened since her infarct on 08/30/2024.  Of note, patient recently had UTI on 09/20/2024 and was treated with Keflex .  Per nephrology note by Lenon NP, patient had a vasovagal episode with altered mentation on 09/30/2024 during hemodialysis session which resolved after IVF and Trendelenburg position.  Palliative medicine then consulted for goals of care discussion. Patient was transitioned to comfort care pathway on 10/01/2024.     ***  Bernarda Kitty, NP Palliative Medicine Team Pager [phone] (Please see [email or web address] for schedule) Team Phone [phone]

## 2024-10-07 NOTE — Discharge Summary (Signed)
 Physician Discharge Summary  SHEVAWN Coleman FMW:[id number] DOB: [birth year] DOA: 09/29/2024  PCP: Corlis Pagan, NP  Admit date: 09/29/2024 Discharge date: 10/07/2024 Recommendations for Outpatient Follow-up:  Follow up with hospice  Discharge Dispo: inpatient hospice Discharge Condition: Stable Code Status:   Code Status: Do not attempt resuscitation (DNR) - Comfort care Diet recommendation:  Diet Order             Diet regular Room service appropriate? Yes; Fluid consistency: Thin  Diet effective now                    Brief/Interim Summary: Norma Coleman is a 89 y.o. female with PMH of hypertension, hyperlipidemia, diabetes, hypothyroidism, CVA, chronic diastolic CHF, ESRD on HD, DVT presenting with altered mental status\along with worsening weakness and confusion /lethargy/sleepiness Patient was admitted to the neurology service 12/2-12/10 after acute CVA status post mechanical thrombectomy.  Complicated by new onset seizures for which patient was started on valproic  acid.  Also had some issues with anemia for which she received 2 units of PRBC transfusion.  Was discharged to rehab on dual antiplatelet therapy with plan for 30-day heart monitor. Patient had been doing okay other than treatment for a UTI for which she was started on Keflex  in the ED on 1/10.  Followed up with neurology for 5 days ago and lab workup showed low platelets and low white count so valproic  acid dose was adjusted and plan for repeat labs in a week and a half from now. (Family has not gotten the message for dose adjustment and have been continuing at the original dose.) Patient has developed worsening weakness, confusion, lethargy for the past week or so missed dialysis on Friday due to diarrhea and again 1/19 due to generalized weakness.  She can no longer walk with a walker.  She also has missed some doses of thyroid  medication In ED: Vital signs stable. bicarb 21, BUN 51, creatinine 6.49, glucose 66,  calcium  6.3 which corrects to 7.6 considering albumin  of 2.4, protein 4.3, alk phos stable at 200.  CBC with platelets stable  w/ thrombocytopenia and leukopenia, proBNP elevated to 5252. RVP panel for flu COVID and RSV pending.  Urinalysis ordered.  CXR>.mild cardiomegaly and no acute abnormality. CT head showed no acute abnormality and expected evolution of prior infarct. Patient having intermittent confusion although slowly improving. Overall w/ poor quality of life and failure to thrive. Palliative care was consulted and after extensive family discussion they want a transition to hospice stop dialysis and [facility] is being pursued and has been approved today  Subjective: Seen and examined Patient with daughter at the bedside  patient complains of feeling cold, low appetite not much eating Appears weak frail. Yesterday morning with pain, has been getting p.o. oxy and Ativan  Sleeping, woke up on calling, complains of feeling cold Ill looking frail  Discharge Diagnoses:   End-of-life care: family meeting held with palliative team and decided to transition to hospice/EOL, HD discontinued Awaiting for evaluation for inpatient hospice facility as she is unable to go home with hospice as daughter not be able to manage at home.TOC, PMT and hospice team following She is approved for Hospice.  Failure to thrive ESRD on HD MWF Vasovagal episode during dialysis 1/20: Acute toxic metabolic encephalopathy:-Likely toxic metabolic. Mentation worsening ever since her stroke, also having weakness no longer able to walk with a walker and having failure to thrive.  CT head was obtained, and neurology was curb sided New  onset atrial fibrillation-Eliquis  started but held due to thrombocytopenia Severe hypothyroidism-secondary to noncompliance: History of CVA-Recent admit for MCA CVA s/pmechanical thrombectomy Seizure Disorder Thrombocytopenia, leukopenia Hyperlipidemia Type II  diabetes Hypertension: Anemia of ESRD  DVT prophylaxis: NONE Code Status:   Code Status: Do not attempt resuscitation (DNR) - Comfort care Family Communication: plan of care discussed with patient/daughter updated at bedside  Patient status is: Remains hospitalized because of severity of illness Level of care: Palliative Care   Dispo: The patient is from: home            Anticipated disposition: Planning for inpatient hospice.  Daughter unable to take care of her at home W/ HOSPICE Objective: Vitals last 24 hrs: Vitals:   10/06/24 0549 10/06/24 2000 10/07/24 0429 10/07/24 0925  BP: (!) 148/69 (!) 142/68 114/74 129/85  Pulse: 68 67 77 97  Resp: 16 16 14 15   Temp: 97.9 F (36.6 C) 98.1 F (36.7 C) 97.8 F (36.6 C) 97.9 F (36.6 C)  TempSrc: Oral Oral Oral Oral  SpO2: 98%  99% 99%  Weight:      Height:       Physical Examination: General exam: Alert awake but ill-appearing frail. HEENT:Oral mucosa moist. Respiratory system: Bilaterally clear. Cardiovascular system: S1 & S2 +, No JVD. Gastrointestinal system: Abdomen soft. Nervous System: Alert, awake. Extremities: LE edema neg Skin: No rashes,no icterus. MSK: Weak, frail, NAD.    Consultation: See note.  Discharge Instructions   Allergies as of 10/07/2024       Reactions   Ibuprofen Other (See Comments)   Stomach bleed   Sulfa Antibiotics Other (See Comments)   Unknown         Medication List     STOP taking these medications    aspirin  EC 81 MG tablet   atorvastatin  40 MG tablet Commonly known as: LIPITOR   cephALEXin  500 MG capsule Commonly known as: KEFLEX    Darbepoetin Alfa  100 MCG/0.5ML Sosy injection Commonly known as: ARANESP    Depakote  500 MG DR tablet Generic drug: divalproex    divalproex  500 MG 24 hr tablet Commonly known as: Depakote  ER   doxercalciferol  4 MCG/2ML injection Commonly known as: HECTOROL    levothyroxine  75 MCG tablet Commonly known as: SYNTHROID    melatonin 3 MG  Tabs tablet   polyethylene glycol 17 g packet Commonly known as: MIRALAX  / GLYCOLAX    senna-docusate 8.6-50 MG tablet Commonly known as: Senokot-S   sevelamer  carbonate 800 MG tablet Commonly known as: RENVELA        TAKE these medications    acetaminophen  325 MG tablet Commonly known as: TYLENOL  Take 2 tablets (650 mg total) by mouth every 4 (four) hours as needed for mild pain (pain score 1-3) (or temp > 37.5 C).   ALPRAZolam  1 MG tablet Commonly known as: XANAX  Take 1 mg by mouth at bedtime as needed for anxiety.   bisacodyl  10 MG suppository Commonly known as: DULCOLAX Place 1 suppository (10 mg total) rectally daily as needed for moderate constipation.   haloperidol  0.5 MG tablet Commonly known as: HALDOL  Take 1 tablet (0.5 mg total) by mouth every 4 (four) hours as needed for agitation (or delirium).   HYDROcodone -acetaminophen  5-325 MG tablet Commonly known as: NORCO/VICODIN Take 1 tablet by mouth every 6 (six) hours as needed for moderate pain (pain score 4-6) or severe pain (pain score 7-10).   HYDROmorphone  HCl 1 MG/ML Liqd Commonly known as: DILAUDID  Take 0.5-1 mLs (0.5-1 mg total) by mouth every 4 (four) hours as  needed for severe pain (pain score 7-10) (shortness of breath).   LORazepam  2 MG/ML concentrated solution Commonly known as: ATIVAN  Place 0.5 mLs (1 mg total) under the tongue every 4 (four) hours. May also place 0.5 mLs (1 mg total) every 2 (two) hours as needed for anxiety.   scopolamine  1 MG/3DAYS Commonly known as: TRANSDERM-SCOP Place 1 patch (1 mg total) onto the skin every 3 (three) days as needed (secretions; gurgling).        Allergies[1]  The results of significant diagnostics from this hospitalization (including imaging, microbiology, ancillary and laboratory) are listed below for reference.    Microbiology: Recent Results (from the past 240 hours)  Resp panel by RT-PCR (RSV, Flu A&B, Covid) Anterior Nasal Swab     Status: None    Collection Time: 09/29/24 12:13 PM   Specimen: Anterior Nasal Swab  Result Value Ref Range Status   SARS Coronavirus 2 by RT PCR NEGATIVE NEGATIVE Final   Influenza A by PCR NEGATIVE NEGATIVE Final   Influenza B by PCR NEGATIVE NEGATIVE Final    Comment: (NOTE) The Xpert Xpress SARS-CoV-2/FLU/RSV plus assay is intended as an aid in the diagnosis of influenza from Nasopharyngeal swab specimens and should not be used as a sole basis for treatment. Nasal washings and aspirates are unacceptable for Xpert Xpress SARS-CoV-2/FLU/RSV testing.  Fact Sheet for Patients: bloggercourse.com  Fact Sheet for Healthcare Providers: seriousbroker.it  This test is not yet approved or cleared by the [place]  FDA and has been authorized for detection and/or diagnosis of SARS-CoV-2 by FDA under an Emergency Use Authorization (EUA). This EUA will remain in effect (meaning this test can be used) for the duration of the COVID-19 declaration under Section 564(b)(1) of the Act, 21 U.S.C. section 360bbb-3(b)(1), unless the authorization is terminated or revoked.     Resp Syncytial Virus by PCR NEGATIVE NEGATIVE Final    Comment: (NOTE) Fact Sheet for Patients: bloggercourse.com  Fact Sheet for Healthcare Providers: seriousbroker.it  This test is not yet approved or cleared by the [place]  FDA and has been authorized for detection and/or diagnosis of SARS-CoV-2 by FDA under an Emergency Use Authorization (EUA). This EUA will remain in effect (meaning this test can be used) for the duration of the COVID-19 declaration under Section 564(b)(1) of the Act, 21 U.S.C. section 360bbb-3(b)(1), unless the authorization is terminated or revoked.  Performed at [facility], [address] [place], [place] [address]     Procedures/Studies: CT Head Wo Contrast Result Date: 09/29/2024 EXAM: CT  HEAD WITHOUT CONTRAST 09/29/2024 01:28:56 PM TECHNIQUE: CT of the head was performed without the administration of intravenous contrast. Automated exposure control, iterative reconstruction, and/or weight based adjustment of the mA/kV was utilized to reduce the radiation dose to as low as reasonably achievable. COMPARISON: Head CT and MRI 12/325. CLINICAL HISTORY: Mental status change, unknown cause. FINDINGS: BRAIN AND VENTRICLES: There is no evidence of an acute infarct, intracranial hemorrhage, mass, midline shift, hydrocephalus, or extra-axial fluid collection. The right MCA infarct on last month's studies has evolved, and there is now a small region of developing encephalomalacia in the right insula and frontoparietal operculum. Confluent hypodensities in the cerebral white matter bilaterally are nonspecific but compatible with severe chronic small vessel ischemic disease. A 1 cm calcified mass projecting from the undersurface of the right tentorial leaflet into the cerebellopontine angle cistern is unchanged and compatible with a meningioma. There is mild cerebral atrophy. Calcified atherosclerosis at the skull base. ORBITS: Bilateral  cataract extraction. SINUSES: No acute abnormality. SOFT TISSUES AND SKULL: No acute soft tissue abnormality. No skull fracture. IMPRESSION: 1. No acute intracranial abnormality. 2. Expected interval evolution of a small right MCA infarct. 3. Severe chronic small vessel ischemic disease. Electronically signed by: Dasie Hamburg MD 09/29/2024 01:37 PM EST RP Workstation: [id number]   DG Chest 2 View Result Date: 09/29/2024 CLINICAL DATA:  Shortness of breath with possible altered mental status. EXAM: DG CHEST 2V COMPARISON:  August 12, 2024 FINDINGS: The study is limited secondary to patient positioning. The cardiac silhouette is mildly enlarged and unchanged in size. There is marked severity calcification of the aortic arch. No acute infiltrate, pleural effusion or pneumothorax  is identified. Multilevel degenerative changes are seen throughout the thoracic spine. IMPRESSION: 1. Limited study secondary to patient positioning, without acute cardiopulmonary disease. 2. Stable cardiomegaly. Electronically Signed   By: Suzen Dials M.D.   On: 09/29/2024 13:35    Labs: BNP (last 3 results) No results for input(s): BNP in the last 8760 hours. Basic Metabolic Panel: Recent Labs  Lab 10/01/24 0443  NA 134*  K 3.9  CL 93*  CO2 31  GLUCOSE 86  BUN 27*  CREATININE 4.71*  CALCIUM  9.0   Liver Function Tests: Recent Labs  Lab 10/01/24 0443  AST 27  ALT 8  ALKPHOS 254*  BILITOT 0.3  PROT 5.5*  ALBUMIN  3.1*   No results for input(s): LIPASE, AMYLASE in the last 168 hours. No results for input(s): AMMONIA in the last 168 hours. CBC: Recent Labs  Lab 10/01/24 0443 10/01/24 0919  WBC 2.9*  --   HGB 11.5*  --   HCT 35.0*  --   MCV 102.0*  --   PLT PLATELET CLUMPS NOTED ON SMEAR, COUNT APPEARS DECREASED PLATELET CLUMPS NOTED ON SMEAR, COUNT APPEARS DECREASED   CBG: Recent Labs  Lab 09/30/24 2227 10/01/24 0834 10/01/24 1224  GLUCAP 128* 107* 119*   Hgb A1c No results for input(s): HGBA1C in the last 72 hours. Anemia work up No results for input(s): VITAMINB12, FOLATE, FERRITIN, TIBC, IRON, RETICCTPCT in the last 72 hours.  Cardiac Enzymes: No results for input(s): CKTOTAL, CKMB, CKMBINDEX, TROPONINI in the last 168 hours. BNP: Invalid input(s): POCBNP D-Dimer No results for input(s): DDIMER in the last 72 hours. Lipid Profile No results for input(s): CHOL, HDL, LDLCALC, TRIG, CHOLHDL, LDLDIRECT in the last 72 hours. Thyroid  function studies No results for input(s): TSH, T4TOTAL, T3FREE, THYROIDAB in the last 72 hours.  Invalid input(s): FREET3 Urinalysis    Component Value Date/Time   COLORURINE AMBER (A) 09/20/2024 1550   APPEARANCEUR TURBID (A) 09/20/2024 1550   LABSPEC 1.011  09/20/2024 1550   PHURINE 7.0 09/20/2024 1550   GLUCOSEU NEGATIVE 09/20/2024 1550   HGBUR LARGE (A) 09/20/2024 1550   BILIRUBINUR NEGATIVE 09/20/2024 1550   KETONESUR NEGATIVE 09/20/2024 1550   PROTEINUR >=300 (A) 09/20/2024 1550   NITRITE NEGATIVE 09/20/2024 1550   LEUKOCYTESUR LARGE (A) 09/20/2024 1550   Sepsis Labs Recent Labs  Lab 10/01/24 0443  WBC 2.9*   Microbiology Recent Results (from the past 240 hours)  Resp panel by RT-PCR (RSV, Flu A&B, Covid) Anterior Nasal Swab     Status: None   Collection Time: 09/29/24 12:13 PM   Specimen: Anterior Nasal Swab  Result Value Ref Range Status   SARS Coronavirus 2 by RT PCR NEGATIVE NEGATIVE Final   Influenza A by PCR NEGATIVE NEGATIVE Final   Influenza B by PCR NEGATIVE NEGATIVE Final  Comment: (NOTE) The Xpert Xpress SARS-CoV-2/FLU/RSV plus assay is intended as an aid in the diagnosis of influenza from Nasopharyngeal swab specimens and should not be used as a sole basis for treatment. Nasal washings and aspirates are unacceptable for Xpert Xpress SARS-CoV-2/FLU/RSV testing.  Fact Sheet for Patients: bloggercourse.com  Fact Sheet for Healthcare Providers: seriousbroker.it  This test is not yet approved or cleared by the [place]  FDA and has been authorized for detection and/or diagnosis of SARS-CoV-2 by FDA under an Emergency Use Authorization (EUA). This EUA will remain in effect (meaning this test can be used) for the duration of the COVID-19 declaration under Section 564(b)(1) of the Act, 21 U.S.C. section 360bbb-3(b)(1), unless the authorization is terminated or revoked.     Resp Syncytial Virus by PCR NEGATIVE NEGATIVE Final    Comment: (NOTE) Fact Sheet for Patients: bloggercourse.com  Fact Sheet for Healthcare Providers: seriousbroker.it  This test is not yet approved or cleared by the [place]  FDA  and has been authorized for detection and/or diagnosis of SARS-CoV-2 by FDA under an Emergency Use Authorization (EUA). This EUA will remain in effect (meaning this test can be used) for the duration of the COVID-19 declaration under Section 564(b)(1) of the Act, 21 U.S.C. section 360bbb-3(b)(1), unless the authorization is terminated or revoked.  Performed at [facility], [address] [place], [place] [address]      Time coordinating discharge: 35  minutes  SIGNED: Mennie LAMY, MD  [facility] 10/07/2024, 3:49 PM  If 7PM-7AM, please contact night-coverage [email or web address]       [1]  Allergies Allergen Reactions   Ibuprofen Other (See Comments)    Stomach bleed   Sulfa Antibiotics Other (See Comments)    Unknown

## 2024-10-07 NOTE — Plan of Care (Signed)
   Problem: Education: Goal: Ability to describe self-care measures that may prevent or decrease complications (Diabetes Survival Skills Education) will improve Outcome: Progressing   Problem: Education: Goal: Individualized Educational Video(s) Outcome: Progressing   Problem: Coping: Goal: Ability to adjust to condition or change in health will improve Outcome: Progressing   Problem: Fluid Volume: Goal: Ability to maintain a balanced intake and output will improve Outcome: Progressing   Problem: Health Behavior/Discharge Planning: Goal: Ability to identify and utilize available resources and services will improve Outcome: Progressing   Problem: Health Behavior/Discharge Planning: Goal: Ability to manage health-related needs will improve Outcome: Progressing

## 2024-10-07 NOTE — Progress Notes (Signed)
 Patient left the unit for discharge accompanied by [facility] personnel. Family informed.

## 2024-10-07 NOTE — TOC Progression Note (Addendum)
 Transition of Care [facility]) - Progression Note    Patient Details  Name: DESSIREE SZE MRN: [id number] Date of Birth: Dec 18, 1932  [facility] CM/SW Contact  Clements Toro LITTIE [place], [place] Phone Number: 10/07/2024, 2:19 PM  Clinical Narrative:    1:40PM- Melissa with [facility] messaged CSW and RNCM, she is keeping an eye on t for decline to qualify for [facility]. CSW will continue to follow   2:21 PM-CSW received a message from [place] with [facility], Pt as been accepted for residential hospice but they do not currently have a bed available. CSW contacted pt daughter, Burnard and provided her with update that [facility] has accepted and we are just waiting on bed availability, Burnard understood and is agreeable to facility once a bed becomes available. CSW will continue to follow.                     Expected Discharge Plan and Services                                               Social Drivers of Health (SDOH) Interventions SDOH Screenings   Food Insecurity: No Food Insecurity (09/29/2024)  Housing: Low Risk (09/29/2024)  Transportation Needs: No Transportation Needs (09/29/2024)  Utilities: Not At Risk (09/29/2024)  Social Connections: Socially Isolated (09/29/2024)  Tobacco Use: Low Risk (09/29/2024)    Readmission Risk Interventions     No data to display

## 2024-10-07 NOTE — Progress Notes (Signed)
 Report called to [place], RN at [facility].

## 2024-10-07 NOTE — TOC Transition Note (Signed)
 Transition of Care [facility]) - Discharge Note   Patient Details  Name: Norma Coleman MRN: [id number] Date of Birth: 1933/04/30  Transition of Care [facility]) CM/SW Contact:  Jeoffrey LITTIE [name] Phone Number: 10/07/2024, 3:54 PM   Clinical Narrative:    Patient will DC to: [facility]  [place] Anticipated DC date: 10/07/24 Family notified: Yes Transport by: [facility]   Per MD patient ready for DC to [facility]. RN to call report prior to discharge [phone] . RN, patient, patient's family, and facility notified of DC. Discharge Summary and FL2 sent to facility. DC packet on chart. Ambulance transport requested for patient.   CSW will sign off for now as social work intervention is no longer needed. Please consult us  again if new needs arise.     Final next level of care: Hospice Medical Facility Barriers to Discharge: Barriers Resolved   Patient Goals and CMS Choice            Discharge Placement              Patient chooses bed at: Other - please specify in the comment section below: ([facility] [address] [place] [address]) Patient to be transferred to facility by: [facility] Name of family member notified: Burnard Patient and family notified of of transfer: 10/07/24  Discharge Plan and Services Additional resources added to the After Visit Summary for                                       Social Drivers of Health (SDOH) Interventions SDOH Screenings   Food Insecurity: No Food Insecurity (09/29/2024)  Housing: Low Risk (09/29/2024)  Transportation Needs: No Transportation Needs (09/29/2024)  Utilities: Not At Risk (09/29/2024)  Social Connections: Socially Isolated (09/29/2024)  Tobacco Use: Low Risk (09/29/2024)     Readmission Risk Interventions     No data to display

## 2024-10-09 ENCOUNTER — Ambulatory Visit: Attending: Internal Medicine | Admitting: Internal Medicine

## 2024-10-16 NOTE — Telephone Encounter (Signed)
 Contacted pt again, LVM req call back. Advised I will also send [facility] msg.

## 2024-10-16 NOTE — Telephone Encounter (Signed)
 FYI I reached out to pt daughter to see if she can have pt call the office back due to not being able to reach her and no DPR on file. Daughter advised pt is deceased. Condolences was given from [facility].

## 2025-01-22 ENCOUNTER — Ambulatory Visit: Admitting: Adult Health
